# Patient Record
Sex: Female | Born: 1975
Health system: Southern US, Community
[De-identification: ages and names within clinical notes are randomized; demographics above are authoritative.]

## PROBLEM LIST (undated history)

## (undated) DIAGNOSIS — E559 Vitamin D deficiency, unspecified: Secondary | ICD-10-CM

## (undated) DIAGNOSIS — E538 Deficiency of other specified B group vitamins: Secondary | ICD-10-CM

## (undated) DIAGNOSIS — M2550 Pain in unspecified joint: Secondary | ICD-10-CM

## (undated) DIAGNOSIS — L409 Psoriasis, unspecified: Secondary | ICD-10-CM

## (undated) DIAGNOSIS — M549 Dorsalgia, unspecified: Secondary | ICD-10-CM

## (undated) DIAGNOSIS — T7840XA Allergy, unspecified, initial encounter: Secondary | ICD-10-CM

## (undated) DIAGNOSIS — M7989 Other specified soft tissue disorders: Secondary | ICD-10-CM

## (undated) DIAGNOSIS — Z973 Presence of spectacles and contact lenses: Secondary | ICD-10-CM

## (undated) DIAGNOSIS — R002 Palpitations: Secondary | ICD-10-CM

## (undated) DIAGNOSIS — F329 Major depressive disorder, single episode, unspecified: Secondary | ICD-10-CM

## (undated) DIAGNOSIS — I471 Supraventricular tachycardia, unspecified: Secondary | ICD-10-CM

## (undated) DIAGNOSIS — Z91018 Allergy to other foods: Secondary | ICD-10-CM

## (undated) DIAGNOSIS — D509 Iron deficiency anemia, unspecified: Secondary | ICD-10-CM

## (undated) DIAGNOSIS — D649 Anemia, unspecified: Secondary | ICD-10-CM

## (undated) DIAGNOSIS — K829 Disease of gallbladder, unspecified: Secondary | ICD-10-CM

## (undated) DIAGNOSIS — F32A Depression, unspecified: Secondary | ICD-10-CM

## (undated) DIAGNOSIS — R609 Edema, unspecified: Secondary | ICD-10-CM

## (undated) DIAGNOSIS — E739 Lactose intolerance, unspecified: Secondary | ICD-10-CM

## (undated) DIAGNOSIS — R011 Cardiac murmur, unspecified: Secondary | ICD-10-CM

## (undated) HISTORY — DX: Allergy, unspecified, initial encounter: T78.40XA

## (undated) HISTORY — DX: Anemia, unspecified: D64.9

## (undated) HISTORY — DX: Disease of gallbladder, unspecified: K82.9

## (undated) HISTORY — DX: Psoriasis, unspecified: L40.9

## (undated) HISTORY — DX: Vitamin D deficiency, unspecified: E55.9

## (undated) HISTORY — DX: Cardiac murmur, unspecified: R01.1

## (undated) HISTORY — DX: Allergy to other foods: Z91.018

## (undated) HISTORY — DX: Lactose intolerance, unspecified: E73.9

## (undated) HISTORY — DX: Pain in unspecified joint: M25.50

## (undated) HISTORY — DX: Palpitations: R00.2

## (undated) HISTORY — DX: Dorsalgia, unspecified: M54.9

## (undated) HISTORY — DX: Edema, unspecified: R60.9

## (undated) HISTORY — DX: Deficiency of other specified B group vitamins: E53.8

---

## 2004-10-05 HISTORY — PX: LAPAROSCOPIC CHOLECYSTECTOMY: SUR755

## 2004-10-05 HISTORY — PX: CHOLECYSTECTOMY: SHX55

## 2005-10-05 HISTORY — PX: GASTRIC BYPASS: SHX52

## 2005-10-05 HISTORY — PX: ROUX-EN-Y GASTRIC BYPASS: SHX1104

## 2006-10-05 HISTORY — PX: COLPOSCOPY: SHX161

## 2014-01-15 DIAGNOSIS — L409 Psoriasis, unspecified: Secondary | ICD-10-CM | POA: Insufficient documentation

## 2014-01-15 DIAGNOSIS — D229 Melanocytic nevi, unspecified: Secondary | ICD-10-CM

## 2014-01-15 HISTORY — DX: Melanocytic nevi, unspecified: D22.9

## 2014-06-17 ENCOUNTER — Ambulatory Visit (INDEPENDENT_AMBULATORY_CARE_PROVIDER_SITE_OTHER): Payer: PRIVATE HEALTH INSURANCE | Admitting: Family Medicine

## 2014-06-17 VITALS — BP 120/68 | HR 61 | Temp 98.2°F | Resp 16 | Ht 64.0 in | Wt 193.0 lb

## 2014-06-17 DIAGNOSIS — J3489 Other specified disorders of nose and nasal sinuses: Secondary | ICD-10-CM

## 2014-06-17 DIAGNOSIS — R0981 Nasal congestion: Secondary | ICD-10-CM

## 2014-06-17 DIAGNOSIS — J029 Acute pharyngitis, unspecified: Secondary | ICD-10-CM

## 2014-06-17 DIAGNOSIS — J039 Acute tonsillitis, unspecified: Secondary | ICD-10-CM

## 2014-06-17 LAB — POCT RAPID STREP A (OFFICE): RAPID STREP A SCREEN: NEGATIVE

## 2014-06-17 MED ORDER — AMOXICILLIN 500 MG PO CAPS: 500.0000 mg | ORAL_CAPSULE | Freq: Three times a day (TID) | ORAL | Status: DC

## 2014-06-17 NOTE — Patient Instructions (Addendum)
You should receive a call or letter about your lab results within the next week to 10 days.  Can start antibiotic as discussed, other symptomatic care below. Return to the clinic or go to the nearest emergency room if any of your symptoms worsen or new symptoms occur.  Sore Throat A sore throat is pain, burning, irritation, or scratchiness of the throat. There is often pain or tenderness when swallowing or talking. A sore throat may be accompanied by other symptoms, such as coughing, sneezing, fever, and swollen neck glands. A sore throat is often the first sign of another sickness, such as a cold, flu, strep throat, or mononucleosis (commonly known as mono). Most sore throats go away without medical treatment. CAUSES  The most common causes of a sore throat include:  A viral infection, such as a cold, flu, or mono.  A bacterial infection, such as strep throat, tonsillitis, or whooping cough.  Seasonal allergies.  Dryness in the air.  Irritants, such as smoke or pollution.  Gastroesophageal reflux disease (GERD). HOME CARE INSTRUCTIONS   Only take over-the-counter medicines as directed by your caregiver.  Drink enough fluids to keep your urine clear or pale yellow.  Rest as needed.  Try using throat sprays, lozenges, or sucking on hard candy to ease any pain (if older than 4 years or as directed).  Sip warm liquids, such as broth, herbal tea, or warm water with honey to relieve pain temporarily. You may also eat or drink cold or frozen liquids such as frozen ice pops.  Gargle with salt water (mix 1 tsp salt with 8 oz of water).  Do not smoke and avoid secondhand smoke.  Put a cool-mist humidifier in your bedroom at night to moisten the air. You can also turn on a hot shower and sit in the bathroom with the door closed for 5-10 minutes. SEEK IMMEDIATE MEDICAL CARE IF:  You have difficulty breathing.  You are unable to swallow fluids, soft foods, or your saliva.  You have  increased swelling in the throat.  Your sore throat does not get better in 7 days.  You have nausea and vomiting.  You have a fever or persistent symptoms for more than 2-3 days.  You have a fever and your symptoms suddenly get worse. MAKE SURE YOU:   Understand these instructions.  Will watch your condition.  Will get help right away if you are not doing well or get worse. Document Released: 10/29/2004 Document Revised: 09/07/2012 Document Reviewed: 05/29/2012 [facility] Patient Information 2015 [place], [place]. This information is not intended to replace advice given to you by your health care provider. Make sure you discuss any questions you have with your health care provider.

## 2014-06-17 NOTE — Progress Notes (Addendum)
Subjective:    Patient ID: Emily [name], female    DOB: [birth year], 38 y.o.   MRN: [id number] This chart was scribed for Wendie Agreste, MD by Martinique Peace, ED Scribe. The patient was seen in [place]. The patient's care was started at 11:09 AM.  Sore Throat  This is a new problem. The current episode started in the past 7 days. Associated symptoms include congestion and swollen glands. Pertinent negatives include no coughing or vomiting.   HPI Comments: Emily [name] is a 38 y.o. female who presents to the [facility] complaining of sore throat onset 4 days ago with associated nasal congestion. She notes that she did not formally take her temperature but reports feeling like she had a fever. She also adds experiencing some episodes of chills. Pt states that she tried gargling warm water and salt to address symptoms but denies experiencing much relief. She further denies cough, nausea or vomiting. She states that her husband has dealing with similar symptoms but adds that she hasn't been around him enough to have contracted something from him. Pt is non-smoker.   There are no active problems to display for this patient.  Past Medical History  Diagnosis Date  . Allergy   . Anemia    Past Surgical History  Procedure Laterality Date  . Colon surgery     No Known Allergies Prior to Admission medications   Medication Sig Start Date End Date Taking? Authorizing Provider  norgestrel-ethinyl estradiol (LO/OVRAL,CRYSELLE) 0.3-30 MG-MCG tablet Take 1 tablet by mouth daily.   Yes Historical Provider, MD   History   Social History  . Marital Status: Single    Spouse Name: N/A    Number of Children: N/A  . Years of Education: N/A   Occupational History  . Not on file.   Social History Main Topics  . Smoking status: Never Smoker   . Smokeless tobacco: Not on file  . Alcohol Use: Not on file  . Drug Use: Not on file  . Sexual Activity: Not on file   Other Topics Concern  . Not on file    Social History Narrative  . No narrative on file      Review of Systems  Constitutional: Positive for fever and chills.  HENT: Positive for congestion and sore throat.   Respiratory: Negative for cough.   Gastrointestinal: Negative for nausea and vomiting.       Objective:   Physical Exam  Vitals reviewed. Constitutional: She is oriented to person, place, and time. She appears well-developed and well-nourished. No distress.  HENT:  Head: Normocephalic and atraumatic.  Right Ear: Hearing, tympanic membrane, external ear and ear canal normal.  Left Ear: Hearing, tympanic membrane, external ear and ear canal normal.  Nose: Nose normal.  Mouth/Throat: Oropharynx is clear and moist. No oropharyngeal exudate.  White to yellow exudative patches on posterior oropharynx with tonsillar hypertrophy.    Eyes: Conjunctivae and EOM are normal. Pupils are equal, round, and reactive to light.  Neck: Neck supple.  Few scattered enlarged lymph nodes near SCM on the right.   Cardiovascular: Normal rate, regular rhythm, normal heart sounds and intact distal pulses.   No murmur heard. Pulmonary/Chest: Effort normal and breath sounds normal. No respiratory distress. She has no wheezes. She has no rhonchi.  Neurological: She is alert and oriented to person, place, and time.  Skin: Skin is warm and dry. No rash noted.  Psychiatric: She has a normal mood and affect. Her behavior is normal.  Filed Vitals:   06/17/14 1041  BP: 120/68  Pulse: 61  Temp: 98.2 F (36.8 C)  Resp: 16  Height: 5\' 4"  (1.626 m)  Weight: 193 lb (87.544 kg)  SpO2: 98%   Results for orders placed in visit on 06/17/14  POCT RAPID STREP A (OFFICE)      Result Value Ref Range   Rapid Strep A Screen Negative  Negative       Assessment & Plan:   Emily [name] is a 38 y.o. female Acute pharyngitis, unspecified pharyngitis type - Plan: POCT rapid strep A, Culture, Group A Strep, amoxicillin (AMOXIL) 500 MG  capsule  Nasal congestion - Plan: POCT rapid strep A, Culture, Group A Strep  Tonsillitis - Plan: amoxicillin (AMOXIL) 500 MG capsule Exudative tonsillitis with slight LAD, but afebrile.  DDX fasle negative strep vs viral process (CMV, EBV). tx options discussed.  Decided on amox tid for 10 days for strep coverage, warned of possible rash if mono, and contact precautions. Sx care and rtc precautions discussed. Throat cx pending.    Meds ordered this encounter           . amoxicillin (AMOXIL) 500 MG capsule    Sig: Take 1 capsule (500 mg total) by mouth 3 (three) times daily.    Dispense:  30 capsule    Refill:  0   Patient Instructions  You should receive a call or letter about your lab results within the next week to 10 days.  Can start antibiotic as discussed, other symptomatic care below. Return to the clinic or go to the nearest emergency room if any of your symptoms worsen or new symptoms occur.  Sore Throat A sore throat is pain, burning, irritation, or scratchiness of the throat. There is often pain or tenderness when swallowing or talking. A sore throat may be accompanied by other symptoms, such as coughing, sneezing, fever, and swollen neck glands. A sore throat is often the first sign of another sickness, such as a cold, flu, strep throat, or mononucleosis (commonly known as mono). Most sore throats go away without medical treatment. CAUSES  The most common causes of a sore throat include:  A viral infection, such as a cold, flu, or mono.  A bacterial infection, such as strep throat, tonsillitis, or whooping cough.  Seasonal allergies.  Dryness in the air.  Irritants, such as smoke or pollution.  Gastroesophageal reflux disease (GERD). HOME CARE INSTRUCTIONS   Only take over-the-counter medicines as directed by your caregiver.  Drink enough fluids to keep your urine clear or pale yellow.  Rest as needed.  Try using throat sprays, lozenges, or sucking on hard  candy to ease any pain (if older than 4 years or as directed).  Sip warm liquids, such as broth, herbal tea, or warm water with honey to relieve pain temporarily. You may also eat or drink cold or frozen liquids such as frozen ice pops.  Gargle with salt water (mix 1 tsp salt with 8 oz of water).  Do not smoke and avoid secondhand smoke.  Put a cool-mist humidifier in your bedroom at night to moisten the air. You can also turn on a hot shower and sit in the bathroom with the door closed for 5-10 minutes. SEEK IMMEDIATE MEDICAL CARE IF:  You have difficulty breathing.  You are unable to swallow fluids, soft foods, or your saliva.  You have increased swelling in the throat.  Your sore throat does not get better in 7 days.  You have nausea and vomiting.  You have a fever or persistent symptoms for more than 2-3 days.  You have a fever and your symptoms suddenly get worse. MAKE SURE YOU:   Understand these instructions.  Will watch your condition.  Will get help right away if you are not doing well or get worse. Document Released: 10/29/2004 Document Revised: 09/07/2012 Document Reviewed: 05/29/2012 [facility] Patient Information 2015 [place], [place]. This information is not intended to replace advice given to you by your health care provider. Make sure you discuss any questions you have with your health care provider.       I personally performed the services described in this documentation, which was scribed in my presence. The recorded information has been reviewed and considered, and addended by me as needed.

## 2014-06-20 LAB — CULTURE, GROUP A STREP: ORGANISM ID, BACTERIA: NORMAL

## 2014-09-12 DIAGNOSIS — D3132 Benign neoplasm of left choroid: Secondary | ICD-10-CM

## 2014-09-12 DIAGNOSIS — H5203 Hypermetropia, bilateral: Secondary | ICD-10-CM

## 2014-09-12 HISTORY — DX: Benign neoplasm of left choroid: D31.32

## 2014-09-12 HISTORY — DX: Hypermetropia, bilateral: H52.03

## 2015-02-08 DIAGNOSIS — L219 Seborrheic dermatitis, unspecified: Secondary | ICD-10-CM

## 2015-02-08 HISTORY — DX: Seborrheic dermatitis, unspecified: L21.9

## 2015-05-21 DIAGNOSIS — M7541 Impingement syndrome of right shoulder: Secondary | ICD-10-CM

## 2015-05-21 DIAGNOSIS — M19019 Primary osteoarthritis, unspecified shoulder: Secondary | ICD-10-CM

## 2015-05-21 DIAGNOSIS — M25511 Pain in right shoulder: Secondary | ICD-10-CM | POA: Insufficient documentation

## 2015-05-21 DIAGNOSIS — M7521 Bicipital tendinitis, right shoulder: Secondary | ICD-10-CM | POA: Insufficient documentation

## 2015-05-21 HISTORY — DX: Pain in right shoulder: M25.511

## 2015-05-21 HISTORY — DX: Bicipital tendinitis, right shoulder: M75.21

## 2015-05-21 HISTORY — DX: Primary osteoarthritis, unspecified shoulder: M19.019

## 2015-05-21 HISTORY — DX: Impingement syndrome of right shoulder: M75.41

## 2015-10-06 HISTORY — PX: ENDOMETRIAL BIOPSY: SHX622

## 2016-06-18 MED FILL — CLOBETASOL 0.05% SOLUTION: 0.05 | 30 days supply | Qty: 50 | Fill #0

## 2016-08-17 MED FILL — LARIN FE 1.5-30 TABLET: 1.5-30 | 28 days supply | Qty: 28 | Fill #0

## 2016-08-20 MED FILL — LARIN 1.5 MG-30 MCG TABLET: 1.5-30 | 28 days supply | Qty: 21 | Fill #0

## 2016-08-26 MED FILL — CLOBETASOL 0.05% OINTMENT: 0.05 | 30 days supply | Qty: 60 | Fill #0

## 2016-09-10 ENCOUNTER — Encounter (INDEPENDENT_AMBULATORY_CARE_PROVIDER_SITE_OTHER): Payer: PRIVATE HEALTH INSURANCE | Admitting: Family Medicine

## 2016-09-17 MED FILL — CLOBETASOL 0.05% SOLUTION: 0.05 | 30 days supply | Qty: 50 | Fill #1

## 2016-09-21 ENCOUNTER — Encounter (INDEPENDENT_AMBULATORY_CARE_PROVIDER_SITE_OTHER): Payer: Self-pay | Admitting: Family Medicine

## 2016-09-21 ENCOUNTER — Ambulatory Visit (INDEPENDENT_AMBULATORY_CARE_PROVIDER_SITE_OTHER): Payer: 59 | Admitting: Family Medicine

## 2016-09-21 VITALS — BP 120/66 | HR 62 | Temp 97.8°F | Resp 16 | Ht 64.0 in | Wt 205.0 lb

## 2016-09-21 DIAGNOSIS — D649 Anemia, unspecified: Secondary | ICD-10-CM | POA: Diagnosis not present

## 2016-09-21 DIAGNOSIS — Z9884 Bariatric surgery status: Secondary | ICD-10-CM | POA: Diagnosis not present

## 2016-09-21 DIAGNOSIS — R5383 Other fatigue: Secondary | ICD-10-CM | POA: Diagnosis not present

## 2016-09-21 DIAGNOSIS — R0602 Shortness of breath: Secondary | ICD-10-CM

## 2016-09-21 DIAGNOSIS — E538 Deficiency of other specified B group vitamins: Secondary | ICD-10-CM

## 2016-09-21 DIAGNOSIS — Z9189 Other specified personal risk factors, not elsewhere classified: Secondary | ICD-10-CM | POA: Diagnosis not present

## 2016-09-21 DIAGNOSIS — R011 Cardiac murmur, unspecified: Secondary | ICD-10-CM

## 2016-09-21 DIAGNOSIS — Z1389 Encounter for screening for other disorder: Secondary | ICD-10-CM

## 2016-09-21 DIAGNOSIS — E559 Vitamin D deficiency, unspecified: Secondary | ICD-10-CM | POA: Diagnosis not present

## 2016-09-21 DIAGNOSIS — R5382 Chronic fatigue, unspecified: Secondary | ICD-10-CM | POA: Diagnosis not present

## 2016-09-21 DIAGNOSIS — Z1331 Encounter for screening for depression: Secondary | ICD-10-CM

## 2016-09-21 DIAGNOSIS — Z6835 Body mass index (BMI) 35.0-35.9, adult: Secondary | ICD-10-CM

## 2016-09-21 DIAGNOSIS — E669 Obesity, unspecified: Secondary | ICD-10-CM | POA: Diagnosis not present

## 2016-09-21 DIAGNOSIS — Z0289 Encounter for other administrative examinations: Secondary | ICD-10-CM

## 2016-09-21 DIAGNOSIS — D518 Other vitamin B12 deficiency anemias: Secondary | ICD-10-CM | POA: Diagnosis not present

## 2016-09-21 NOTE — Progress Notes (Signed)
Office: [phone]  /  Fax: [phone]   HPI:   Chief Complaint: OBESITY  Emily [name] (MR# [id number]) is a 40 y.o. female who presents on 09/23/2016 for obesity evaluation and treatment. Current BMI is Body mass index is 35.19 kg/m.[name] has struggled with obesity for years and has been unsuccessful in either losing weight or maintaining long term weight loss. Ariyanna attended our information session and states she is currently in the action stage of change and ready to dedicate time achieving and maintaining a healthier weight.  Ruthine states she has significant food cravings issues  she snacks frequently in the evenings she frequently makes poor food choices. She is s/p "mini" gastric bypass in 2007, went from 336 down to 201, and maintained for a few years but her weight is starting to creep back up.   Fatigue Augustina feels her energy is lower than it should be. This has worsened with weight gain and has not worsened recently. Khilynn reports daytime somnolence and  reports waking up still tired. Patient is at risk for obstructive sleep apnea. Patent has a history of symptoms of daytime fatigue. Patient generally gets 8 hours of sleep per night, and states they generally have generally restful sleep. Snoring is not present. Apneic episodes are not present. Epworth Sleepiness Score is 17  Dyspnea on exertion [name] notes increasing shortness of breath with exercising and seems to be worsening over time with weight gain. She notes getting out of breath sooner with activity than she used to. This has not gotten worse recently. Leandra denies shortness of breath at rest or orthopnea.  Anemia Vit D deficiency B12 deficiency Pt is s/p GBP and is not taking her bariatric vitamins anymore. + fatigue  Depression Screen Halle's Food and Mood (modified PHQ-9) score was  Depression screen PHQ 2/9 09/21/2016  Decreased Interest 1  Down, Depressed, Hopeless 1  PHQ - 2  Score 2  Altered sleeping 0  Tired, decreased energy 1  Change in appetite 1  Feeling bad or failure about yourself  2  Trouble concentrating 0  Moving slowly or fidgety/restless 1  Suicidal thoughts 0  PHQ-9 Score 7      ALLERGIES: No Known Allergies  MEDICATIONS: Current Outpatient Prescriptions on File Prior to Visit  Medication Sig Dispense Refill  . amoxicillin (AMOXIL) 500 MG capsule Take 1 capsule (500 mg total) by mouth 3 (three) times daily. (Patient not taking: Reported on 09/21/2016) 30 capsule 0  . norgestrel-ethinyl estradiol (LO/OVRAL,CRYSELLE) 0.3-30 MG-MCG tablet Take 1 tablet by mouth daily.     No current facility-administered medications on file prior to visit.     PAST MEDICAL HISTORY: Past Medical History:  Diagnosis Date  . Allergy   . Anemia   . B12 deficiency   . Back pain   . Gallbladder problem   . Joint pain   . Lactose intolerance   . Multiple food allergies   . Palpitations   . Psoriasis   . Swelling   . Vitamin D deficiency     PAST SURGICAL HISTORY: Past Surgical History:  Procedure Laterality Date  . COLON SURGERY    . GASTRIC BYPASS      SOCIAL HISTORY: Social History  Substance Use Topics  . Smoking status: Never Smoker  . Smokeless tobacco: Never Used  . Alcohol use Not on file    FAMILY HISTORY: Family History  Problem Relation Age of Onset  . Hypertension Mother   . Obesity Mother   . Hypertension  Father   . Sleep apnea Father   . Obesity Father     ROS: Review of Systems  Eyes:       Vision changes  Cardiovascular:       Leg cramping   Musculoskeletal: Positive for joint pain.  Skin: Positive for itching ([place]).    PHYSICAL EXAM: Blood pressure 120/66, pulse 62, temperature 97.8 F (36.6 C), temperature source Oral, resp. rate 16, height 5\' 4"  (1.626 m), weight 205 lb (93 kg), SpO2 99 %. Body mass index is 35.19 kg/m. Physical Exam  Constitutional: She is oriented to person, place, and time.  She appears well-developed and well-nourished.  HENT:  Head: Normocephalic and atraumatic.  Nose: Nose normal.  Eyes: EOM are normal. No scleral icterus.  Neck: Normal range of motion. Neck supple. No thyromegaly present.  Cardiovascular: Normal rate and regular rhythm.   Pulmonary/Chest: Effort normal and breath sounds normal.  Abdominal: Soft. There is no tenderness.  Musculoskeletal: Normal range of motion. She exhibits edema.  Neurological: She is alert and oriented to person, place, and time. Coordination normal.  Skin: Skin is warm and dry.  Psychiatric: She has a normal mood and affect. Her behavior is normal.  Vitals reviewed.   RECENT LABS AND TESTS: BMET    Component Value Date/Time   NA 140 09/21/2016 1540   K 4.8 09/21/2016 1540   CL 100 09/21/2016 1540   CO2 25 09/21/2016 1540   GLUCOSE 85 09/21/2016 1540   BUN 14 09/21/2016 1540   CREATININE 0.68 09/21/2016 1540   CALCIUM 8.9 09/21/2016 1540   GFRNONAA 108 09/21/2016 1540   GFRAA 125 09/21/2016 1540   Lab Results  Component Value Date   HGBA1C 5.0 09/21/2016   Lab Results  Component Value Date   INSULIN 2.7 09/21/2016   CBC    Component Value Date/Time   WBC 5.9 09/21/2016 1540   RBC 4.66 09/21/2016 1540   HCT 41.1 09/21/2016 1540   MCV 88 09/21/2016 1540   MCH 29.0 09/21/2016 1540   MCHC 32.8 09/21/2016 1540   RDW 13.3 09/21/2016 1540   LYMPHSABS 2.2 09/21/2016 1540   EOSABS 0.1 09/21/2016 1540   BASOSABS 0.0 09/21/2016 1540   Iron/TIBC/Ferritin/ %Sat No results found for: IRON, TIBC, FERRITIN, IRONPCTSAT Lipid Panel     Component Value Date/Time   CHOL 192 09/21/2016 1540   TRIG 61 09/21/2016 1540   HDL 112 09/21/2016 1540   LDLCALC 68 09/21/2016 1540   Hepatic Function Panel     Component Value Date/Time   PROT 6.3 09/21/2016 1540   ALBUMIN 3.9 09/21/2016 1540   AST 31 09/21/2016 1540   ALT 30 09/21/2016 1540   ALKPHOS 77 09/21/2016 1540   BILITOT 1.6 (H) 09/21/2016 1540        Component Value Date/Time   TSH 2.210 09/21/2016 1540    ECG done today shows NSR @ 56 BPM INDIRECT CALORIMETER done today shows a REE of 2071.    ASSESSMENT AND PLAN: Other fatigue - Plan: EKG 12-Lead, Comprehensive metabolic panel, CBC With Differential, Hemoglobin A1c, Insulin, random, Lipid Panel With LDL/HDL Ratio, Vitamin B12, Folate, T3, T4, free, TSH  Shortness of breath  Vitamin D deficiency - Plan: VITAMIN D 25 Hydroxy (Vit-D Deficiency, Fractures)  Vitamin B 12 deficiency - Plan: Vitamin B12  Anemia, unspecified type - Plan: Vitamin B12  Depression screen  At risk for heart disease  Class 2 obesity with body mass index (BMI) of 35.0 to 35.9 in adult,  unspecified obesity type, unspecified whether serious comorbidity present  Status post gastric bypass for obesity  PLAN:  Fatigue Raiya was informed that her fatigue may be related to obesity, depression or many other causes. Labs will be ordered, and in the meanwhile Reecie has agreed to work on diet, exercise and weight loss to help with fatigue. Proper sleep hygiene was discussed including the need for 7-8 hours of quality sleep each night. A sleep study was not yet ordered based on symptoms and Epworth score   Exercise Induced Shortness of Breath Glori's shortness of breath appears to be obesity related and exercise induced. She has agreed to work on weight loss and gradually increase exercise to treat her exercise induced shortness of breath. If Jevaeh follows our instructions and loses weight without improvement of her shortness of breath, we will plan to refer to pulmonology. We will monitor this condition regularly. Lacricia agrees to this plan.  Multiple vitamin deficiencies s/p GBP Will check labs and start on vitamins at next visit.  Depression Screen Felisia had a positive depression screening. Depression is commonly associated with obesity and often results in emotional eating behaviors. We  will monitor this closely and work on CBT to help improve the non-hunger eating patterns. Referral to Psychology may be required if no improvement is seen as she continues in our clinic.  Obesity Marium is currently in the action stage of change and her goal is to continue with weight loss efforts She has agreed to follow the Category 3 plan Verdean has been instructed to work up to a goal of 150 minutes of combined cardio and strengthening exercise per week for weight loss and overall health benefits. We discussed the following Behavioral Modification Stratagies today: increasing lean protein intake and holiday eating strategies   Brynnlea has agreed to follow up with our clinic in 2 weeks. She was informed of the importance of frequent follow up visits to maximize her success with intensive lifestyle modifications for her multiple health conditions. She was informed we would discuss her lab results at her next visit unless there is a critical issue that needs to be addressed sooner. Levaeh agreed to keep her next visit at the agreed upon time to discuss these results.  I, Doreene Nest, am acting as scribe for Dennard Nip, MD  I have reviewed the above documentation for accuracy and completeness, and I agree with the above. -Dennard Nip, MD

## 2016-09-22 ENCOUNTER — Encounter (INDEPENDENT_AMBULATORY_CARE_PROVIDER_SITE_OTHER): Payer: Self-pay | Admitting: Family Medicine

## 2016-09-22 ENCOUNTER — Ambulatory Visit (INDEPENDENT_AMBULATORY_CARE_PROVIDER_SITE_OTHER): Payer: PRIVATE HEALTH INSURANCE | Admitting: Family Medicine

## 2016-09-22 LAB — CBC WITH DIFFERENTIAL
BASOS: 0 %
Basophils Absolute: 0 10*3/uL (ref 0.0–0.2)
EOS (ABSOLUTE): 0.1 10*3/uL (ref 0.0–0.4)
EOS: 2 %
HEMATOCRIT: 41.1 % (ref 34.0–46.6)
Hemoglobin: 13.5 g/dL (ref 11.1–15.9)
IMMATURE GRANS (ABS): 0 10*3/uL (ref 0.0–0.1)
Immature Granulocytes: 0 %
LYMPHS: 37 %
Lymphocytes Absolute: 2.2 10*3/uL (ref 0.7–3.1)
MCH: 29 pg (ref 26.6–33.0)
MCHC: 32.8 g/dL (ref 31.5–35.7)
MCV: 88 fL (ref 79–97)
MONOCYTES: 4 %
Monocytes Absolute: 0.3 10*3/uL (ref 0.1–0.9)
Neutrophils Absolute: 3.3 10*3/uL (ref 1.4–7.0)
Neutrophils: 57 %
RBC: 4.66 x10E6/uL (ref 3.77–5.28)
RDW: 13.3 % (ref 12.3–15.4)
WBC: 5.9 10*3/uL (ref 3.4–10.8)

## 2016-09-22 LAB — LIPID PANEL WITH LDL/HDL RATIO
CHOLESTEROL TOTAL: 192 mg/dL (ref 100–199)
HDL: 112 mg/dL (ref >39)
LDL Calculated: 68 mg/dL (ref 0–99)
LDl/HDL Ratio: 0.6 ratio units (ref 0.0–3.2)
Triglycerides: 61 mg/dL (ref 0–149)
VLDL Cholesterol Cal: 12 mg/dL (ref 5–40)

## 2016-09-22 LAB — T4, FREE: Free T4: 1.07 ng/dL (ref 0.82–1.77)

## 2016-09-22 LAB — COMPREHENSIVE METABOLIC PANEL
ALK PHOS: 77 IU/L (ref 39–117)
ALT: 30 IU/L (ref 0–32)
AST: 31 IU/L (ref 0–40)
Albumin/Globulin Ratio: 1.6 (ref 1.2–2.2)
Albumin: 3.9 g/dL (ref 3.5–5.5)
BILIRUBIN TOTAL: 1.6 mg/dL — AB (ref 0.0–1.2)
BUN/Creatinine Ratio: 21 (ref 9–23)
BUN: 14 mg/dL (ref 6–24)
CHLORIDE: 100 mmol/L (ref 96–106)
CO2: 25 mmol/L (ref 18–29)
CREATININE: 0.68 mg/dL (ref 0.57–1.00)
Calcium: 8.9 mg/dL (ref 8.7–10.2)
GFR calc Af Amer: 125 mL/min/{1.73_m2} (ref >59)
GFR calc non Af Amer: 108 mL/min/{1.73_m2} (ref >59)
GLUCOSE: 85 mg/dL (ref 65–99)
Globulin, Total: 2.4 g/dL (ref 1.5–4.5)
Potassium: 4.8 mmol/L (ref 3.5–5.2)
Sodium: 140 mmol/L (ref 134–144)
Total Protein: 6.3 g/dL (ref 6.0–8.5)

## 2016-09-22 LAB — HEMOGLOBIN A1C
Est. average glucose Bld gHb Est-mCnc: 97 mg/dL
Hgb A1c MFr Bld: 5 % (ref 4.8–5.6)

## 2016-09-22 LAB — VITAMIN B12: VITAMIN B 12: 181 pg/mL — AB (ref 232–1245)

## 2016-09-22 LAB — T3: T3, Total: 88 ng/dL (ref 71–180)

## 2016-09-22 LAB — TSH: TSH: 2.21 u[IU]/mL (ref 0.450–4.500)

## 2016-09-22 LAB — VITAMIN D 25 HYDROXY (VIT D DEFICIENCY, FRACTURES): Vit D, 25-Hydroxy: 27 ng/mL — ABNORMAL LOW (ref 30.0–100.0)

## 2016-09-22 LAB — INSULIN, RANDOM: INSULIN: 2.7 u[IU]/mL (ref 2.6–24.9)

## 2016-09-22 LAB — FOLATE: Folate: 3.7 ng/mL (ref >3.0)

## 2016-09-23 ENCOUNTER — Ambulatory Visit (INDEPENDENT_AMBULATORY_CARE_PROVIDER_SITE_OTHER): Payer: PRIVATE HEALTH INSURANCE | Admitting: Family Medicine

## 2016-10-07 ENCOUNTER — Ambulatory Visit (INDEPENDENT_AMBULATORY_CARE_PROVIDER_SITE_OTHER): Payer: 59 | Admitting: Family Medicine

## 2016-10-07 VITALS — BP 102/65 | Ht 64.0 in | Wt 207.0 lb

## 2016-10-07 DIAGNOSIS — E669 Obesity, unspecified: Secondary | ICD-10-CM

## 2016-10-07 DIAGNOSIS — E538 Deficiency of other specified B group vitamins: Secondary | ICD-10-CM

## 2016-10-07 DIAGNOSIS — Z9884 Bariatric surgery status: Secondary | ICD-10-CM

## 2016-10-07 DIAGNOSIS — E559 Vitamin D deficiency, unspecified: Secondary | ICD-10-CM

## 2016-10-07 DIAGNOSIS — Z6835 Body mass index (BMI) 35.0-35.9, adult: Secondary | ICD-10-CM | POA: Diagnosis not present

## 2016-10-07 MED ORDER — VITAMIN B-12 1000 MCG PO TABS: 1000.0000 ug | ORAL_TABLET | Freq: Every day | ORAL | Status: DC

## 2016-10-07 MED ORDER — VITAMIN D (ERGOCALCIFEROL) 1.25 MG (50000 UNIT) PO CAPS: 50000.0000 [IU] | ORAL_CAPSULE | ORAL | 0 refills | Status: DC

## 2016-10-07 MED FILL — VIT D2 1.25 MG (50,000 UNIT: 1.25 MG | 28 days supply | Qty: 4 | Fill #0

## 2016-10-07 NOTE — Progress Notes (Signed)
Office: [phone]  /  Fax: [phone]   HPI:   Chief Complaint: OBESITY Emily [name] is here to discuss her progress with her obesity treatment plan. She is following her eating plan approximately 40 % of the time and states she is exercising 60 minutes 6 times per week. Emily [name] is currently struggling with social eating and had less protein. States she didn't struggle with breakfast but wants more freedom for dinner. Ready to get back on track.  Her weight is 207 lb (93.9 kg) today and has had a weight loss of 2 pounds over a period of 2 weeks since her last visit. She has lost 2 lbs since starting treatment with [name].  Vitamin D deficiency Emily [name] has a diagnosis of vitamin D deficiency. She is not currently taking vit D and denies nausea, vomiting or muscle weakness. Level not at goal, positive fatigue.  Vitamin B12 deficiency Low Vitamin B12 at 181 with normal Hgb and MVC, not on bariatric vitamins, positive fatigue.  Status Post Weight Loss Surgery Patient not taking bariatric vitamins, has had multiple vitamin deficiencies in the past. Positive fatigue.   Wt Readings from Last 500 Encounters:  10/07/16 207 lb (93.9 kg)  09/21/16 205 lb (93 kg)  06/17/14 193 lb (87.5 kg)     ALLERGIES: No Known Allergies  MEDICATIONS: Current Outpatient Prescriptions on File Prior to Visit  Medication Sig Dispense Refill  . norethindrone-ethinyl estradiol-iron (MICROGESTIN FE,GILDESS FE,LOESTRIN FE) 1.5-30 MG-MCG tablet Take 1 tablet by mouth daily.     No current facility-administered medications on file prior to visit.     PAST MEDICAL HISTORY: Past Medical History:  Diagnosis Date  . Allergy   . Anemia   . B12 deficiency   . Back pain   . Gallbladder problem   . Joint pain   . Lactose intolerance   . Multiple food allergies   . Palpitations   . Psoriasis   . Swelling   . Vitamin D deficiency     PAST SURGICAL HISTORY: Past Surgical History:  Procedure Laterality  Date  . COLON SURGERY    . GASTRIC BYPASS      SOCIAL HISTORY: Social History  Substance Use Topics  . Smoking status: Never Smoker  . Smokeless tobacco: Never Used  . Alcohol use Not on file    FAMILY HISTORY: Family History  Problem Relation Age of Onset  . Hypertension Mother   . Obesity Mother   . Hypertension Father   . Sleep apnea Father   . Obesity Father     ROS: Review of Systems  Constitutional: Positive for malaise/fatigue.    PHYSICAL EXAM: Blood pressure 102/65, height 5\' 4"  (1.626 m), weight 207 lb (93.9 kg). Body mass index is 35.53 kg/m. Physical Exam  Constitutional: She is oriented to person, place, and time. She appears well-developed and well-nourished.  HENT:  Head: Normocephalic and atraumatic.  Nose: Nose normal.  Eyes: EOM are normal. No scleral icterus.  Neck: Normal range of motion. Neck supple. No thyromegaly present.  Cardiovascular: Normal rate and regular rhythm.   Pulmonary/Chest: No respiratory distress.  Abdominal: Soft. There is no tenderness.  Obesity  Musculoskeletal: Normal range of motion. She exhibits edema.  Neurological: She is alert and oriented to person, place, and time. Coordination normal.  Skin: Skin is warm and dry.  Psychiatric: She has a normal mood and affect. Her behavior is normal.    RECENT LABS AND TESTS: BMET    Component Value Date/Time   NA  140 09/21/2016 1540   K 4.8 09/21/2016 1540   CL 100 09/21/2016 1540   CO2 25 09/21/2016 1540   GLUCOSE 85 09/21/2016 1540   BUN 14 09/21/2016 1540   CREATININE 0.68 09/21/2016 1540   CALCIUM 8.9 09/21/2016 1540   GFRNONAA 108 09/21/2016 1540   GFRAA 125 09/21/2016 1540   Lab Results  Component Value Date   HGBA1C 5.0 09/21/2016   Lab Results  Component Value Date   INSULIN 2.7 09/21/2016   CBC    Component Value Date/Time   WBC 5.9 09/21/2016 1540   RBC 4.66 09/21/2016 1540   HCT 41.1 09/21/2016 1540   MCV 88 09/21/2016 1540   MCH 29.0  09/21/2016 1540   MCHC 32.8 09/21/2016 1540   RDW 13.3 09/21/2016 1540   LYMPHSABS 2.2 09/21/2016 1540   EOSABS 0.1 09/21/2016 1540   BASOSABS 0.0 09/21/2016 1540   Iron/TIBC/Ferritin/ %Sat No results found for: IRON, TIBC, FERRITIN, IRONPCTSAT Lipid Panel     Component Value Date/Time   CHOL 192 09/21/2016 1540   TRIG 61 09/21/2016 1540   HDL 112 09/21/2016 1540   LDLCALC 68 09/21/2016 1540   Hepatic Function Panel     Component Value Date/Time   PROT 6.3 09/21/2016 1540   ALBUMIN 3.9 09/21/2016 1540   AST 31 09/21/2016 1540   ALT 30 09/21/2016 1540   ALKPHOS 77 09/21/2016 1540   BILITOT 1.6 (H) 09/21/2016 1540      Component Value Date/Time   TSH 2.210 09/21/2016 1540    ASSESSMENT AND PLAN: No diagnosis found.  PLAN: Vitamin D Deficiency Emily [name] was informed that low vitamin D levels contributes to fatigue and are associated with obesity, breast, and colon cancer. She agrees to start to take prescription Vit D @50 ,000 IU every week, #4, no refills, will re-check in 3 months and will follow up for routine testing of vitamin D, at least 2-3 times per year. She was informed of the risk of over-replacement of vitamin D and agrees to not increase her dose unless he discusses this with [name] first.  B12 Deficiency Start B12 OTC 1,000 MG daily, re-check labs in 3 months.  Status Post Weight Loss Surgery Patient encouraged to re-start bariatric vitamins, will research the best options and discuss with patient at next visit in 2 weeks.  Obesity Emily [name] is currently in the action stage of change. As such, her goal is to continue with weight loss efforts She has agreed to keep a food journal with 350-500 calories and 30 grams protein daily and follow the Category 3 plan Emily [name] has been instructed to work up to a goal of 150 minutes of combined cardio and strengthening exercise per week for weight loss and overall health benefits. We discussed the following Behavioral  Modification Stratagies today: increasing lean protein intake, work on meal planning and easy cooking plans and holiday eating strategies      Emily [name] has agreed to follow up with our clinic in 2 weeks. She was informed of the importance of frequent follow up visits to maximize her success with intensive lifestyle modifications for her multiple health conditions.  I, Doreene Nest, am acting as scribe for Dennard Nip, MD  I have reviewed the above documentation for accuracy and completeness, and I agree with the above. -Dennard Nip, MD

## 2016-10-19 ENCOUNTER — Ambulatory Visit (INDEPENDENT_AMBULATORY_CARE_PROVIDER_SITE_OTHER): Payer: 59 | Admitting: Family Medicine

## 2016-10-19 VITALS — BP 107/65 | HR 52 | Temp 98.5°F | Ht 64.0 in | Wt 203.0 lb

## 2016-10-19 DIAGNOSIS — Z9889 Other specified postprocedural states: Secondary | ICD-10-CM

## 2016-10-19 DIAGNOSIS — Z6833 Body mass index (BMI) 33.0-33.9, adult: Secondary | ICD-10-CM

## 2016-10-19 DIAGNOSIS — E669 Obesity, unspecified: Secondary | ICD-10-CM

## 2016-10-19 DIAGNOSIS — E559 Vitamin D deficiency, unspecified: Secondary | ICD-10-CM | POA: Diagnosis not present

## 2016-10-19 DIAGNOSIS — E66811 Obesity, class 1: Secondary | ICD-10-CM | POA: Insufficient documentation

## 2016-10-19 DIAGNOSIS — Z9189 Other specified personal risk factors, not elsewhere classified: Secondary | ICD-10-CM | POA: Diagnosis not present

## 2016-10-20 MED ORDER — VITAMIN D (ERGOCALCIFEROL) 1.25 MG (50000 UNIT) PO CAPS: 50000.0000 [IU] | ORAL_CAPSULE | ORAL | 0 refills | Status: AC

## 2016-10-20 NOTE — Progress Notes (Signed)
Office: [phone]  /  Fax: [phone]   HPI:   Chief Complaint: OBESITY Emily [name] is here to discuss her progress with her obesity treatment plan. She is following her eating plan approximately 80 % of the time and states she is exercising/strength training 45 minutes 6 times per week. Emily [name] is currently struggling with following plan closely and feeling judged by co-workers and family who may not be judging her. She is working to get more protein, and is doing well with exercise.  Her weight is 203 lb (92.1 kg) today and has lost 4 lbs since her last visit. She has lost 2 lbs since starting treatment with [name].  Vitamin D deficiency Emily [name] has a diagnosis of vitamin D deficiency. She is currently taking vit D, not yet at goal,  Emily [name] still reports fatigue, denies nausea, vomiting or muscle weakness.  Status Post Weight Loss Surgery History of gastric bypass surgery  At risk for Depression  Wt Readings from Last 500 Encounters:  10/19/16 203 lb (92.1 kg)  10/07/16 207 lb (93.9 kg)  09/21/16 205 lb (93 kg)  06/17/14 193 lb (87.5 kg)     ALLERGIES: No Known Allergies  MEDICATIONS: Current Outpatient Prescriptions on File Prior to Visit  Medication Sig Dispense Refill  . norethindrone-ethinyl estradiol-iron (MICROGESTIN FE,GILDESS FE,LOESTRIN FE) 1.5-30 MG-MCG tablet Take 1 tablet by mouth daily.    . vitamin B-12 (CYANOCOBALAMIN) 1000 MCG tablet Take 1 tablet (1,000 mcg total) by mouth daily. (Patient taking differently: Take 2,500 mcg by mouth daily. )     No current facility-administered medications on file prior to visit.     PAST MEDICAL HISTORY: Past Medical History:  Diagnosis Date  . Allergy   . Anemia   . B12 deficiency   . Back pain   . Gallbladder problem   . Joint pain   . Lactose intolerance   . Multiple food allergies   . Palpitations   . Psoriasis   . Swelling   . Vitamin D deficiency     PAST SURGICAL HISTORY: Past Surgical History:    Procedure Laterality Date  . COLON SURGERY    . GASTRIC BYPASS      SOCIAL HISTORY: Social History  Substance Use Topics  . Smoking status: Never Smoker  . Smokeless tobacco: Never Used  . Alcohol use Not on file    FAMILY HISTORY: Family History  Problem Relation Age of Onset  . Hypertension Mother   . Obesity Mother   . Hypertension Father   . Sleep apnea Father   . Obesity Father     ROS: Review of Systems  Constitutional: Positive for malaise/fatigue and weight loss.  Gastrointestinal: Negative for nausea and vomiting.  Musculoskeletal:       Negative Muscle Weakness    PHYSICAL EXAM: Blood pressure 107/65, pulse (!) 52, temperature 98.5 F (36.9 C), temperature source Oral, height 5\' 4"  (1.626 m), weight 203 lb (92.1 kg), SpO2 100 %. Body mass index is 34.84 kg/m. Physical Exam  Constitutional: She is oriented to person, place, and time. She appears well-developed and well-nourished.  Cardiovascular: Normal rate.   Pulmonary/Chest: Effort normal.  Musculoskeletal: Normal range of motion.  Neurological: She is oriented to person, place, and time.  Skin: Skin is warm and dry.  Psychiatric: She has a normal mood and affect. Her behavior is normal.  Vitals reviewed.   RECENT LABS AND TESTS: BMET    Component Value Date/Time   NA 140 09/21/2016 1540   K  4.8 09/21/2016 1540   CL 100 09/21/2016 1540   CO2 25 09/21/2016 1540   GLUCOSE 85 09/21/2016 1540   BUN 14 09/21/2016 1540   CREATININE 0.68 09/21/2016 1540   CALCIUM 8.9 09/21/2016 1540   GFRNONAA 108 09/21/2016 1540   GFRAA 125 09/21/2016 1540   Lab Results  Component Value Date   HGBA1C 5.0 09/21/2016   Lab Results  Component Value Date   INSULIN 2.7 09/21/2016   CBC    Component Value Date/Time   WBC 5.9 09/21/2016 1540   RBC 4.66 09/21/2016 1540   HCT 41.1 09/21/2016 1540   MCV 88 09/21/2016 1540   MCH 29.0 09/21/2016 1540   MCHC 32.8 09/21/2016 1540   RDW 13.3 09/21/2016 1540    LYMPHSABS 2.2 09/21/2016 1540   EOSABS 0.1 09/21/2016 1540   BASOSABS 0.0 09/21/2016 1540   Iron/TIBC/Ferritin/ %Sat No results found for: IRON, TIBC, FERRITIN, IRONPCTSAT Lipid Panel     Component Value Date/Time   CHOL 192 09/21/2016 1540   TRIG 61 09/21/2016 1540   HDL 112 09/21/2016 1540   LDLCALC 68 09/21/2016 1540   Hepatic Function Panel     Component Value Date/Time   PROT 6.3 09/21/2016 1540   ALBUMIN 3.9 09/21/2016 1540   AST 31 09/21/2016 1540   ALT 30 09/21/2016 1540   ALKPHOS 77 09/21/2016 1540   BILITOT 1.6 (H) 09/21/2016 1540      Component Value Date/Time   TSH 2.210 09/21/2016 1540    ASSESSMENT AND PLAN: Vitamin D deficiency - Plan: Vitamin D, Ergocalciferol, (DRISDOL) 50000 units CAPS capsule  At risk for depression  Status post gastric surgery  Class 1 obesity without serious comorbidity with body mass index (BMI) of 33.0 to 33.9 in adult, unspecified obesity type  PLAN:  Vitamin D Deficiency Emily [name] was informed that low vitamin D levels contributes to fatigue and are associated with obesity, breast, and colon cancer. She agrees to continue to take prescription Vit D @50 ,000 IU every week, refill vit D 50,000 IU every week #4 with no refills and will follow up for routine testing of vitamin D, at least 2-3 times per year. She was informed of the risk of over-replacement of vitamin D and agrees to not increase her dose unless he discusses this with [name] first.  Status Post Weight Loss Surgery Emily [name] will continue on Bariatric Advantage and will follow with labs  At Risk for Depression Pt is at risk for depression due to her emotional eating and post weight loss surgery status. We spent 15 minutes discussing using food for comfort and watching for signs of depression including poor sleep, overeating, irritability, and feeling of guilt. We will continue to monitor.      Obesity Emily [name] is currently in the action stage of change. As such, her  goal is to continue with weight loss efforts She has agreed to keep a food journal with 300-450 calories and 30+ grams protein daily and follow the Category 2 plan Emily [name] has been instructed to work up to a goal of 150 minutes of combined cardio and strengthening exercise per week for weight loss and overall health benefits. We discussed the following Behavioral Modification Stratagies today: increasing lean protein intake, decreasing simple carbohydrates  and increasing lower sugar fruits  Discussed Cognitive Behavioral Therapy to help Emily [name] work on decreasing projection of her feelings onto others and to help avoid depressive symptoms.  Emily [name] has agreed to follow up with our clinic in 2 weeks. She was  informed of the importance of frequent follow up visits to maximize her success with intensive lifestyle modifications for her multiple health conditions.  I, Doreene Nest, am acting as scribe for Dennard Nip, MD  I have reviewed the above documentation for accuracy and completeness, and I agree with the above. -Dennard Nip, MD

## 2016-10-23 ENCOUNTER — Ambulatory Visit: Payer: Self-pay | Admitting: Family

## 2016-10-30 ENCOUNTER — Encounter: Payer: Self-pay | Admitting: Family

## 2016-10-30 ENCOUNTER — Ambulatory Visit (INDEPENDENT_AMBULATORY_CARE_PROVIDER_SITE_OTHER): Payer: 59 | Admitting: Family

## 2016-10-30 ENCOUNTER — Telehealth: Payer: Self-pay | Admitting: *Deleted

## 2016-10-30 DIAGNOSIS — E559 Vitamin D deficiency, unspecified: Secondary | ICD-10-CM

## 2016-10-30 DIAGNOSIS — N761 Subacute and chronic vaginitis: Secondary | ICD-10-CM | POA: Diagnosis not present

## 2016-10-30 DIAGNOSIS — E538 Deficiency of other specified B group vitamins: Secondary | ICD-10-CM | POA: Diagnosis not present

## 2016-10-30 DIAGNOSIS — Z91018 Allergy to other foods: Secondary | ICD-10-CM | POA: Insufficient documentation

## 2016-10-30 DIAGNOSIS — L409 Psoriasis, unspecified: Secondary | ICD-10-CM | POA: Diagnosis not present

## 2016-10-30 DIAGNOSIS — Z0000 Encounter for general adult medical examination without abnormal findings: Secondary | ICD-10-CM

## 2016-10-30 MED ORDER — MICROGESTIN FE 1.5/30 1.5-30 MG-MCG PO TABS: 1.0000 | ORAL_TABLET | Freq: Every day | ORAL | 1 refills | Status: DC

## 2016-10-30 MED ORDER — EPINEPHRINE 0.3 MG/0.3ML IJ SOAJ
0.3000 mg | Freq: Once | INTRAMUSCULAR | 1 refills | Status: AC
Start: 2016-10-30 — End: 2016-10-30

## 2016-10-30 MED ORDER — DESONIDE 0.05 % EX CREA: TOPICAL_CREAM | CUTANEOUS | 2 refills | Status: DC

## 2016-10-30 MED ORDER — NORETHIN ACE-ETH ESTRAD-FE 1.5-30 MG-MCG PO TABS
1.0000 | ORAL_TABLET | Freq: Every day | ORAL | 1 refills | Status: DC
Start: 2016-10-30 — End: 2017-07-22

## 2016-10-30 MED FILL — LARIN FE 1.5-30 TABLET: 1.5-30 | 84 days supply | Qty: 84 | Fill #0

## 2016-10-30 MED FILL — EPINEPHRINE 0.3 MG AUTO-INJ: 0.3 | 30 days supply | Qty: 2 | Fill #0

## 2016-10-30 MED FILL — DESONIDE 0.05% CREAM: 0.05 | 30 days supply | Qty: 30 | Fill #0

## 2016-10-30 NOTE — Assessment & Plan Note (Signed)
Would like referral to a GYN closer to home.

## 2016-10-30 NOTE — Progress Notes (Signed)
Subjective:    Patient ID: Emily [name], female    DOB: December 27, 1975, 41 y.o.   MRN: [id number]  HPI  Ms. Blamer is a 41 yr old female who presents today to establish care.   Pmhx is significant for the following:  1) Vitamin D deficiency-  Maintained on weekly vitamin D. This was restarted 3 weeks ago by Dr. Leafy Ro.    2) Vit B12 deficiency- takes a daily oral supplement.  This was recently restarted by Dr. Leafy Ro.    3) Obesity- she is following with Dr. Dennard Nip [facility]. She s/p Gastirc bypass on 2007.   Wt Readings from Last 3 Encounters:  10/30/16 205 lb 6.4 oz (93.2 kg)  10/19/16 203 lb (92.1 kg)  10/07/16 207 lb (93.9 kg)   She was following with GYN. Was on a flagyl gel   4) Psoriasis- notes that the worst area is on her scalp, also has a non-healing   5) Food allergies- needs refill on her epipen.  She reports hx of "retina problem" never followed through with referral to retina specialist.    Review of Systems  Constitutional: Negative for unexpected weight change.  HENT: Negative for hearing loss.   Eyes: Negative for visual disturbance.  Respiratory: Negative for cough.   Cardiovascular: Negative for leg swelling.  Gastrointestinal: Negative for constipation and diarrhea.  Genitourinary: Negative for dysuria and frequency.  Musculoskeletal: Negative for arthralgias and myalgias.  Neurological: Negative for headaches.  Hematological: Negative for adenopathy.  Psychiatric/Behavioral:       Denies depression/anxiety   Past Medical History:  Diagnosis Date  . Allergy   . Anemia   . B12 deficiency   . Back pain   . Gallbladder problem   . Joint pain   . Lactose intolerance   . Multiple food allergies   . Palpitations   . Psoriasis   . Swelling   . Vitamin D deficiency      Social History   Social History  . Marital status: Single    Spouse name: N/A  . Number of children: N/A  . Years of education: N/A    Occupational History  . RN [employer]   Social History Main Topics  . Smoking status: Never Smoker  . Smokeless tobacco: Never Used  . Alcohol use Not on file  . Drug use: Unknown  . Sexual activity: Not on file   Other Topics Concern  . Not on file   Social History Narrative   Works as a Engineer, maintenance at [employer]   No children   Not married   Has a dog named Arnot   From [place]- moved to [place] in 2002   Parents are both living   Has one sister back home   Enjoys going out to eat with friends   Enjoys biking and kayaking.    Goes to the GYM 6 days a week    Past Surgical History:  Procedure Laterality Date  . CHOLECYSTECTOMY  2006  . COLPOSCOPY  2008  . ENDOMETRIAL BIOPSY  2017   negative per pt  . GASTRIC BYPASS  2007    Family History  Problem Relation Age of Onset  . Hypertension Mother   . Obesity Mother   . Hypertension Father   . Sleep apnea Father   . Obesity Father   . Alzheimer's disease Maternal Grandmother   . Leukemia Maternal Grandfather   . Alzheimer's disease Paternal Grandmother   .  Cancer Paternal Grandfather   . Diabetes type II Maternal Aunt   . Stroke Maternal Aunt     Allergies  Allergen Reactions  . Almond Oil Swelling  . Apple Swelling  . Kiwi Extract Swelling  . Soy Allergy Other (See Comments)  . Strawberry Extract Other (See Comments)    Current Outpatient Prescriptions on File Prior to Visit  Medication Sig Dispense Refill  . vitamin B-12 (CYANOCOBALAMIN) 1000 MCG tablet Take 1 tablet (1,000 mcg total) by mouth daily. (Patient taking differently: Take 2,500 mcg by mouth daily. )    . Vitamin D, Ergocalciferol, (DRISDOL) 50000 units CAPS capsule Take 1 capsule (50,000 Units total) by mouth every 7 (seven) days. 4 capsule 0   No current facility-administered medications on file prior to visit.     BP (!) 120/54 (BP Location: Right Arm, Cuff Size: Large)   Pulse 62   Temp 98.2 F (36.8 C) (Oral)   Resp 18    Ht 5\' 4"  (1.626 m)   Wt 205 lb 6.4 oz (93.2 kg)   LMP 05/21/2016   SpO2 100% Comment: room air  BMI 35.26 kg/m     Objective:   Physical Exam  Constitutional: She is oriented to person, place, and time. She appears well-developed and well-nourished. No distress.  HENT:  Head: Normocephalic and atraumatic.  Right Ear: Tympanic membrane and ear canal normal.  Left Ear: Tympanic membrane and ear canal normal.  Mouth/Throat: No oropharyngeal exudate, posterior oropharyngeal edema or posterior oropharyngeal erythema.  Eyes: Conjunctivae are normal. No scleral icterus.  Neck: Neck supple.  Cardiovascular: Normal rate and regular rhythm.   No murmur heard. Pulmonary/Chest: Breath sounds normal. No respiratory distress. She has no wheezes. She has no rales. She exhibits no tenderness.  Musculoskeletal: She exhibits no edema.  Lymphadenopathy:    She has no cervical adenopathy.  Neurological: She is alert and oriented to person, place, and time.  Skin: Skin is warm and dry.  Some psoriasis noted in bilateral ear canal  Psychiatric: She has a normal mood and affect. Her behavior is normal. Judgment and thought content normal.          Assessment & Plan:

## 2016-10-30 NOTE — Telephone Encounter (Signed)
Received call from Emily [name] at [facility] stating Microgestin is no longer available in brand form and they are requesting that we send generic Rx if pt is able to take generic?  Please advise?

## 2016-10-30 NOTE — Assessment & Plan Note (Signed)
Stable with prn use of steroid cream.

## 2016-10-30 NOTE — Telephone Encounter (Signed)
OK, please notify patient .

## 2016-10-30 NOTE — Assessment & Plan Note (Signed)
Continue vit D supplement

## 2016-10-30 NOTE — Assessment & Plan Note (Signed)
Refill provided for epipen.  Patient is advised to also keep liquid benadryl with her.

## 2016-10-30 NOTE — Progress Notes (Signed)
Pre visit review using our clinic review tool, if applicable. No additional management support is needed unless otherwise documented below in the visit note. 

## 2016-10-30 NOTE — Assessment & Plan Note (Signed)
Continue oral b12 supplemtn.

## 2016-10-30 NOTE — Telephone Encounter (Signed)
Notified pt and she is ok to try generic. Rx re-sent.

## 2016-10-30 NOTE — Patient Instructions (Addendum)
Please schedule a complete physical at the front desk. Continue healthy diet and regular exercise.  You will be contacted about your referral to GYN. Please send me the information about your retinal diagnosis via mychart and I will arrange your referral to the [facility] specialist.

## 2016-11-04 ENCOUNTER — Ambulatory Visit (INDEPENDENT_AMBULATORY_CARE_PROVIDER_SITE_OTHER): Payer: 59 | Admitting: Family Medicine

## 2016-11-04 VITALS — BP 107/66 | HR 56 | Temp 98.6°F | Ht 64.0 in | Wt 204.0 lb

## 2016-11-04 DIAGNOSIS — F3289 Other specified depressive episodes: Secondary | ICD-10-CM | POA: Diagnosis not present

## 2016-11-04 DIAGNOSIS — E669 Obesity, unspecified: Secondary | ICD-10-CM

## 2016-11-04 DIAGNOSIS — Z9884 Bariatric surgery status: Secondary | ICD-10-CM | POA: Diagnosis not present

## 2016-11-04 DIAGNOSIS — Z6835 Body mass index (BMI) 35.0-35.9, adult: Secondary | ICD-10-CM | POA: Diagnosis not present

## 2016-11-04 DIAGNOSIS — E559 Vitamin D deficiency, unspecified: Secondary | ICD-10-CM

## 2016-11-04 MED ORDER — BUPROPION HCL ER (SR) 150 MG PO TB12: 150.0000 mg | ORAL_TABLET | Freq: Every day | ORAL | 0 refills | Status: DC

## 2016-11-04 MED FILL — VIT D2 1.25 MG (50,000 UNIT: 1.25 MG | 28 days supply | Qty: 4 | Fill #0

## 2016-11-04 MED FILL — BUPROPION SR 150 MG TABLET: 150 | 30 days supply | Qty: 30 | Fill #0

## 2016-11-04 NOTE — Progress Notes (Signed)
Office: [phone]  /  Fax: [phone]   HPI:   Chief Complaint: OBESITY Emily [name] is here to discuss her progress with her obesity treatment plan. She is following her eating plan approximately 80 % of the time and states she is exercising 60 minutes 6 times per week. Emily [name] is currently struggling with more cravings in the past 2 weeks. She likes the structure of the plan but she is having more snacks at night. Her weight is 204 lb (92.5 kg) today and she has had a weight gain of 1 lb over a period of 2 weeks since her last visit. She has lost 1 lbs since starting treatment with [name].  Depression Emily [name] has increased emotional eating and increased stress, she is feeling more out of control. She denies suicidal or homicidal ideas.  Vitamin D deficiency Emily [name] has a diagnosis of vitamin D deficiency. She is currently taking vit D and denies nausea, vomiting or muscle weakness.  Wt Readings from Last 500 Encounters:  11/04/16 204 lb (92.5 kg)  10/30/16 205 lb 6.4 oz (93.2 kg)  10/19/16 203 lb (92.1 kg)  10/07/16 207 lb (93.9 kg)  09/21/16 205 lb (93 kg)  06/17/14 193 lb (87.5 kg)     ALLERGIES: Allergies  Allergen Reactions  . Almond Oil Swelling  . Apple Swelling  . Kiwi Extract Swelling  . Soy Allergy Other (See Comments)  . Strawberry Extract Other (See Comments)    MEDICATIONS: Current Outpatient Prescriptions on File Prior to Visit  Medication Sig Dispense Refill  . clobetasol (TEMOVATE) 0.05 % external solution APPLY TO SCALP 3-4 TIMES PER WEEK AS NEEDED FOR ITCHING AND SCALING. NOT TO FACE.    . clobetasol ointment (TEMOVATE) 0.05 % Apply twice a day to thickest, most resistant areas, never on the face or in the skin folds.    Marland Kitchen desonide (DESOWEN) 0.05 % cream Apply to affected areas on the face and ears twice daily for 2 weeks, then daily for 2 wks, then taper off. Repeat as needed. 30 g 2  . norethindrone-ethinyl estradiol-iron (MICROGESTIN FE 1.5/30)  1.5-30 MG-MCG tablet Take 1 tablet by mouth daily. 3 Package 1  . vitamin B-12 (CYANOCOBALAMIN) 1000 MCG tablet Take 1 tablet (1,000 mcg total) by mouth daily. (Patient taking differently: Take 2,500 mcg by mouth daily. )    . Vitamin D, Ergocalciferol, (DRISDOL) 50000 units CAPS capsule Take 1 capsule (50,000 Units total) by mouth every 7 (seven) days. 4 capsule 0   No current facility-administered medications on file prior to visit.     PAST MEDICAL HISTORY: Past Medical History:  Diagnosis Date  . Allergy   . Anemia   . B12 deficiency   . Back pain   . Gallbladder problem   . Joint pain   . Lactose intolerance   . Multiple food allergies   . Palpitations   . Psoriasis   . Swelling   . Vitamin D deficiency     PAST SURGICAL HISTORY: Past Surgical History:  Procedure Laterality Date  . CHOLECYSTECTOMY  2006  . COLPOSCOPY  2008  . ENDOMETRIAL BIOPSY  2017   negative per pt  . GASTRIC BYPASS  2007    SOCIAL HISTORY: Social History  Substance Use Topics  . Smoking status: Never Smoker  . Smokeless tobacco: Never Used  . Alcohol use Yes     Comment: 2-3 glasses a week    FAMILY HISTORY: Family History  Problem Relation Age of Onset  . Hypertension Mother   .  Obesity Mother   . Hypertension Father   . Sleep apnea Father   . Obesity Father   . Alzheimer's disease Maternal Grandmother   . Leukemia Maternal Grandfather   . Alzheimer's disease Paternal Grandmother   . Cancer Paternal Grandfather   . Diabetes type II Maternal Aunt   . Stroke Maternal Aunt     ROS: Review of Systems  Constitutional: Negative for weight loss.  Gastrointestinal: Negative for nausea and vomiting.  Musculoskeletal:       Negative Muscle Weakness  Psychiatric/Behavioral: Negative for suicidal ideas.       Negative for homicidal ideas Positive Stress    PHYSICAL EXAM: Blood pressure 107/66, pulse (!) 56, temperature 98.6 F (37 C), temperature source Oral, height 5\' 4"  (1.626  m), weight 204 lb (92.5 kg), last menstrual period 05/15/2016, SpO2 98 %. Body mass index is 35.02 kg/m. Physical Exam  Constitutional: She is oriented to person, place, and time. She appears well-developed and well-nourished.  Cardiovascular: Normal rate.   Pulmonary/Chest: Effort normal.  Musculoskeletal: Normal range of motion. She exhibits edema (trace edema bilateral lower extremeties).  Neurological: She is oriented to person, place, and time.  Skin: Skin is warm and dry.  Vitals reviewed.   RECENT LABS AND TESTS: BMET    Component Value Date/Time   NA 140 09/21/2016 1540   K 4.8 09/21/2016 1540   CL 100 09/21/2016 1540   CO2 25 09/21/2016 1540   GLUCOSE 85 09/21/2016 1540   BUN 14 09/21/2016 1540   CREATININE 0.68 09/21/2016 1540   CALCIUM 8.9 09/21/2016 1540   GFRNONAA 108 09/21/2016 1540   GFRAA 125 09/21/2016 1540   Lab Results  Component Value Date   HGBA1C 5.0 09/21/2016   Lab Results  Component Value Date   INSULIN 2.7 09/21/2016   CBC    Component Value Date/Time   WBC 5.9 09/21/2016 1540   RBC 4.66 09/21/2016 1540   HCT 41.1 09/21/2016 1540   MCV 88 09/21/2016 1540   MCH 29.0 09/21/2016 1540   MCHC 32.8 09/21/2016 1540   RDW 13.3 09/21/2016 1540   LYMPHSABS 2.2 09/21/2016 1540   EOSABS 0.1 09/21/2016 1540   BASOSABS 0.0 09/21/2016 1540   Iron/TIBC/Ferritin/ %Sat No results found for: IRON, TIBC, FERRITIN, IRONPCTSAT Lipid Panel     Component Value Date/Time   CHOL 192 09/21/2016 1540   TRIG 61 09/21/2016 1540   HDL 112 09/21/2016 1540   LDLCALC 68 09/21/2016 1540   Hepatic Function Panel     Component Value Date/Time   PROT 6.3 09/21/2016 1540   ALBUMIN 3.9 09/21/2016 1540   AST 31 09/21/2016 1540   ALT 30 09/21/2016 1540   ALKPHOS 77 09/21/2016 1540   BILITOT 1.6 (H) 09/21/2016 1540      Component Value Date/Time   TSH 2.210 09/21/2016 1540    ASSESSMENT AND PLAN: Other depression - Plan: buPROPion (WELLBUTRIN SR) 150 MG 12  hr tablet  Vitamin D deficiency  Class 2 obesity without serious comorbidity with body mass index (BMI) of 35.0 to 35.9 in adult, unspecified obesity type  PLAN:  Depression with Emotional Eating Behaviors We discussed behavior modification techniques today to help [name] deal with her emotional eating and depression. She has agreed to start to take Wellbutrin SR 150 mg every morning #30 with no refills and agreed to follow up as directed.  Vitamin D Deficiency Zyanya was informed that low vitamin D levels contributes to fatigue and are associated with obesity, breast,  and colon cancer. She agrees to continue to take prescription Vit D @50 ,000 IU every week and will re-check labs in 1 month and will follow up for routine testing of vitamin D, at least 2-3 times per year. She was informed of the risk of over-replacement of vitamin D and agrees to not increase her dose unless he discusses this with [name] first.  Obesity Adamina is currently in the action stage of change. As such, her goal is to continue with weight loss efforts She has agreed to follow the Category 3 plan Shiana has been instructed to work up to a goal of 150 minutes of combined cardio and strengthening exercise per week for weight loss and overall health benefits. We discussed the following Behavioral Modification Strategies today: increasing lean protein intake, decreasing simple carbohydrates , increasing lower sugar fruits, increasing fiber rich foods, work on meal planning and easy cooking plans, ways to avoid boredom eating, ways to avoid night time snacking and avoiding temptations   Mackynzie has agreed to follow up with our clinic in 2 weeks. She was informed of the importance of frequent follow up visits to maximize her success with intensive lifestyle modifications for her multiple health conditions.  I, Doreene Nest, am acting as scribe for Dennard Nip, MD  I have reviewed the above documentation for  accuracy and completeness, and I agree with the above. -Dennard Nip, MD

## 2016-11-17 ENCOUNTER — Encounter: Payer: Self-pay | Admitting: Family

## 2016-11-17 MED ORDER — OSELTAMIVIR PHOSPHATE 75 MG PO CAPS: 75.0000 mg | ORAL_CAPSULE | Freq: Every day | ORAL | 0 refills | Status: DC

## 2016-11-17 MED FILL — OSELTAMIVIR PHOSPHATE 75 MG: 75 | 10 days supply | Qty: 10 | Fill #0

## 2016-11-17 NOTE — Telephone Encounter (Signed)
Rx sent to [facility].

## 2016-11-17 NOTE — Telephone Encounter (Signed)
See message, send Tamiflu 75 mg 1 by mouth daily #10 no refills  Received: Today  Iota, MD  Damita Dunnings, CMA

## 2016-11-17 NOTE — Telephone Encounter (Signed)
Please advise 

## 2016-11-19 ENCOUNTER — Ambulatory Visit (HOSPITAL_BASED_OUTPATIENT_CLINIC_OR_DEPARTMENT_OTHER)
Admission: RE | Admit: 2016-11-19 | Discharge: 2016-11-19 | Disposition: A | Discharge status: Home / Self Care | Payer: 59 | Source: Ambulatory Visit | Attending: Family | Admitting: Family

## 2016-11-19 ENCOUNTER — Encounter (INDEPENDENT_AMBULATORY_CARE_PROVIDER_SITE_OTHER): Payer: Self-pay | Admitting: Family Medicine

## 2016-11-19 ENCOUNTER — Ambulatory Visit (INDEPENDENT_AMBULATORY_CARE_PROVIDER_SITE_OTHER): Payer: 59 | Admitting: Family Medicine

## 2016-11-19 VITALS — BP 115/69 | HR 63 | Temp 97.8°F | Ht 64.0 in | Wt 202.0 lb

## 2016-11-19 DIAGNOSIS — F3289 Other specified depressive episodes: Secondary | ICD-10-CM | POA: Diagnosis not present

## 2016-11-19 DIAGNOSIS — N6489 Other specified disorders of breast: Secondary | ICD-10-CM | POA: Insufficient documentation

## 2016-11-19 DIAGNOSIS — Z6834 Body mass index (BMI) 34.0-34.9, adult: Secondary | ICD-10-CM | POA: Diagnosis not present

## 2016-11-19 DIAGNOSIS — E669 Obesity, unspecified: Secondary | ICD-10-CM

## 2016-11-19 DIAGNOSIS — E559 Vitamin D deficiency, unspecified: Secondary | ICD-10-CM

## 2016-11-19 DIAGNOSIS — R928 Other abnormal and inconclusive findings on diagnostic imaging of breast: Secondary | ICD-10-CM | POA: Insufficient documentation

## 2016-11-19 DIAGNOSIS — E66811 Obesity, class 1: Secondary | ICD-10-CM | POA: Insufficient documentation

## 2016-11-19 DIAGNOSIS — Z1231 Encounter for screening mammogram for malignant neoplasm of breast: Secondary | ICD-10-CM | POA: Diagnosis not present

## 2016-11-19 DIAGNOSIS — Z0000 Encounter for general adult medical examination without abnormal findings: Secondary | ICD-10-CM

## 2016-11-19 MED ORDER — BUPROPION HCL ER (SR) 200 MG PO TB12: 200.0000 mg | ORAL_TABLET | Freq: Every day | ORAL | 0 refills | Status: DC

## 2016-11-19 MED FILL — BUPROPION HCL SR 200 MG TAB: 200 | 30 days supply | Qty: 30 | Fill #0

## 2016-11-19 NOTE — Progress Notes (Signed)
Office: [phone]  /  Fax: [phone]   HPI:   Chief Complaint: OBESITY Emily [name] is here to discuss her progress with her obesity treatment plan. She is following her eating plan approximately 75 % of the time and states she is exercising 45 minutes 6 times per week. Patient continues to well with weight loss over the last two years.  Emily [name] is still currently struggling with snacking especially at night but doing well with decreased temptations at work.  Her weight is 202 lb (91.6 kg) today and has had a weight loss of 2 pounds over a period of 2 weeks since her last visit. She has lost 3 lbs since starting treatment with [name].  Depression with emotional eating behaviors Emily [name] is struggling with emotional eating and using food for comfort to the extent that it is negatively impacting her health. She often snacks when she is not hungry. Emily [name] sometimes feels she is out of control and then feels guilty that she made poor food choices. She has been working on behavior modification techniques to help reduce her emotional eating and has been somewhat successful. She shows no sign of suicidal or homicidal ideations.  Vitamin D deficiency Emily [name] has a diagnosis of vitamin D deficiency. She is not currently taking vit D and denies nausea, vomiting or muscle weakness.    Depression screen PHQ 2/9 09/21/2016  Decreased Interest 1  Down, Depressed, Hopeless 1  PHQ - 2 Score 2  Altered sleeping 0  Tired, decreased energy 1  Change in appetite 1  Feeling bad or failure about yourself  2  Trouble concentrating 0  Moving slowly or fidgety/restless 1  Suicidal thoughts 0  PHQ-9 Score 7     Wt Readings from Last 500 Encounters:  11/19/16 202 lb (91.6 kg)  11/04/16 204 lb (92.5 kg)  10/30/16 205 lb 6.4 oz (93.2 kg)  10/19/16 203 lb (92.1 kg)  10/07/16 207 lb (93.9 kg)  09/21/16 205 lb (93 kg)  06/17/14 193 lb (87.5 kg)     ALLERGIES: Allergies  Allergen Reactions  .  Almond Oil Swelling  . Apple Swelling  . Kiwi Extract Swelling  . Soy Allergy Other (See Comments)  . Strawberry Extract Other (See Comments)    MEDICATIONS: Current Outpatient Prescriptions on File Prior to Visit  Medication Sig Dispense Refill  . clobetasol (TEMOVATE) 0.05 % external solution APPLY TO SCALP 3-4 TIMES PER WEEK AS NEEDED FOR ITCHING AND SCALING. NOT TO FACE.    . clobetasol ointment (TEMOVATE) 0.05 % Apply twice a day to thickest, most resistant areas, never on the face or in the skin folds.    Marland Kitchen desonide (DESOWEN) 0.05 % cream Apply to affected areas on the face and ears twice daily for 2 weeks, then daily for 2 wks, then taper off. Repeat as needed. 30 g 2  . norethindrone-ethinyl estradiol-iron (MICROGESTIN FE 1.5/30) 1.5-30 MG-MCG tablet Take 1 tablet by mouth daily. 3 Package 1  . oseltamivir (TAMIFLU) 75 MG capsule Take 1 capsule (75 mg total) by mouth daily. 10 capsule 0  . vitamin B-12 (CYANOCOBALAMIN) 1000 MCG tablet Take 1 tablet (1,000 mcg total) by mouth daily. (Patient taking differently: Take 2,500 mcg by mouth daily. )     No current facility-administered medications on file prior to visit.     PAST MEDICAL HISTORY: Past Medical History:  Diagnosis Date  . Allergy   . Anemia   . B12 deficiency   . Back pain   . Gallbladder  problem   . Joint pain   . Lactose intolerance   . Multiple food allergies   . Palpitations   . Psoriasis   . Swelling   . Vitamin D deficiency     PAST SURGICAL HISTORY: Past Surgical History:  Procedure Laterality Date  . CHOLECYSTECTOMY  2006  . COLPOSCOPY  2008  . ENDOMETRIAL BIOPSY  2017   negative per pt  . GASTRIC BYPASS  2007    SOCIAL HISTORY: Social History  Substance Use Topics  . Smoking status: Never Smoker  . Smokeless tobacco: Never Used  . Alcohol use Yes     Comment: 2-3 glasses a week    FAMILY HISTORY: Family History  Problem Relation Age of Onset  . Hypertension Mother   . Obesity Mother    . Hypertension Father   . Sleep apnea Father   . Obesity Father   . Alzheimer's disease Maternal Grandmother   . Leukemia Maternal Grandfather   . Alzheimer's disease Paternal Grandmother   . Cancer Paternal Grandfather   . Diabetes type II Maternal Aunt   . Stroke Maternal Aunt     ROS: Review of Systems  Constitutional: Positive for weight loss.  All other systems reviewed and are negative.   PHYSICAL EXAM: Blood pressure 115/69, pulse 63, temperature 97.8 F (36.6 C), temperature source Oral, height 5\' 4"  (1.626 m), weight 202 lb (91.6 kg), last menstrual period 05/19/2016, SpO2 97 %. Body mass index is 34.67 kg/m. Physical Exam  Constitutional: She is oriented to person, place, and time. She appears well-developed and well-nourished.  Cardiovascular: Normal rate.   Musculoskeletal: Normal range of motion.  Neurological: She is oriented to person, place, and time.  Skin: Skin is warm and dry.  Psychiatric: She has a normal mood and affect. Her behavior is normal.  Vitals reviewed.   RECENT LABS AND TESTS: BMET    Component Value Date/Time   NA 140 09/21/2016 1540   K 4.8 09/21/2016 1540   CL 100 09/21/2016 1540   CO2 25 09/21/2016 1540   GLUCOSE 85 09/21/2016 1540   BUN 14 09/21/2016 1540   CREATININE 0.68 09/21/2016 1540   CALCIUM 8.9 09/21/2016 1540   GFRNONAA 108 09/21/2016 1540   GFRAA 125 09/21/2016 1540   Lab Results  Component Value Date   HGBA1C 5.0 09/21/2016   Lab Results  Component Value Date   INSULIN 2.7 09/21/2016   CBC    Component Value Date/Time   WBC 5.9 09/21/2016 1540   RBC 4.66 09/21/2016 1540   HCT 41.1 09/21/2016 1540   MCV 88 09/21/2016 1540   MCH 29.0 09/21/2016 1540   MCHC 32.8 09/21/2016 1540   RDW 13.3 09/21/2016 1540   LYMPHSABS 2.2 09/21/2016 1540   EOSABS 0.1 09/21/2016 1540   BASOSABS 0.0 09/21/2016 1540   Iron/TIBC/Ferritin/ %Sat No results found for: IRON, TIBC, FERRITIN, IRONPCTSAT Lipid Panel       Component Value Date/Time   CHOL 192 09/21/2016 1540   TRIG 61 09/21/2016 1540   HDL 112 09/21/2016 1540   LDLCALC 68 09/21/2016 1540   Hepatic Function Panel     Component Value Date/Time   PROT 6.3 09/21/2016 1540   ALBUMIN 3.9 09/21/2016 1540   AST 31 09/21/2016 1540   ALT 30 09/21/2016 1540   ALKPHOS 77 09/21/2016 1540   BILITOT 1.6 (H) 09/21/2016 1540      Component Value Date/Time   TSH 2.210 09/21/2016 1540    ASSESSMENT AND PLAN: Other  depression - Plan: buPROPion (WELLBUTRIN SR) 200 MG 12 hr tablet  Vitamin D deficiency  Class 1 obesity without serious comorbidity with body mass index (BMI) of 34.0 to 34.9 in adult, unspecified obesity type  PLAN: Depression with Emotional Eating Behaviors We discussed behavior modification techniques today to help Emily [name] deal with her emotional eating and depression. She has agreed to an increase in her Wellbutrin SR to 200 mg qd and and new prescription was ordered for her. We will follow up in two weeks as directed.  Vitamin D Deficiency Emily [name] was informed that low vitamin D levels contributes to fatigue and are associated with obesity, breast, and colon cancer. She reported no nausea or vomiting and She agrees to continue to take prescription Vit D @50 ,000 IU every week and will follow up for routine testing of vitamin D in six weeks. She was informed of the risk of over-replacement of vitamin D and agrees to not increase her dose unless he discusses this with [name] first.  Obesity Emily [name] is currently in the action stage of change. As such, her goal is to continue with weight loss efforts She has agreed to keep a food journal with 300 to 450 calories and 30 protein for breakfast and follow the Category 3 plan Emily [name] has been instructed to work up to a goal of 150 minutes of combined cardio and strengthening exercise per week for weight loss and overall health benefits. We discussed the following Behavioral Modification  Stratagies today: increasing lean protein intake, decreasing simple carbohydrates , increasing vegetables, work on meal planning and easy cooking plans and dealing with family or coworker sabotage, learning how to journal.  Emily [name] has agreed to follow up with our clinic in 2 weeks. She was informed of the importance of frequent follow up visits to maximize her success with intensive lifestyle modifications for her multiple health conditions.  I, Mandie Ward, am acting as scribe for Dennard Nip, MD  I have reviewed the above documentation for accuracy and completeness, and I agree with the above. -Dennard Nip, MD

## 2016-11-23 ENCOUNTER — Ambulatory Visit (HOSPITAL_BASED_OUTPATIENT_CLINIC_OR_DEPARTMENT_OTHER)
Admission: RE | Admit: 2016-11-23 | Discharge: 2016-11-23 | Disposition: A | Discharge status: Home / Self Care | Payer: 59 | Source: Ambulatory Visit | Attending: Family | Admitting: Family

## 2016-11-23 ENCOUNTER — Encounter: Payer: Self-pay | Admitting: Family

## 2016-11-23 ENCOUNTER — Ambulatory Visit (INDEPENDENT_AMBULATORY_CARE_PROVIDER_SITE_OTHER): Payer: 59 | Admitting: Family

## 2016-11-23 VITALS — BP 109/51 | HR 76 | Temp 98.6°F | Ht 64.0 in | Wt 203.6 lb

## 2016-11-23 DIAGNOSIS — J111 Influenza due to unidentified influenza virus with other respiratory manifestations: Secondary | ICD-10-CM | POA: Diagnosis not present

## 2016-11-23 DIAGNOSIS — R05 Cough: Secondary | ICD-10-CM | POA: Diagnosis not present

## 2016-11-23 DIAGNOSIS — R062 Wheezing: Secondary | ICD-10-CM | POA: Diagnosis not present

## 2016-11-23 MED ORDER — ALBUTEROL SULFATE HFA 108 (90 BASE) MCG/ACT IN AERS: 2.0000 | INHALATION_SPRAY | Freq: Four times a day (QID) | RESPIRATORY_TRACT | 0 refills | Status: DC | PRN

## 2016-11-23 MED FILL — VENTOLIN HFA 90 MCG INHALER: 108 (90 BAS | 25 days supply | Qty: 18 | Fill #0

## 2016-11-23 NOTE — Progress Notes (Signed)
Pre visit review using our clinic review tool, if applicable. No additional management support is needed unless otherwise documented below in the visit note. 

## 2016-11-23 NOTE — Patient Instructions (Signed)
Begin albuterol. Complete x ray on the first floor. Call if new/worsening symptoms or if symptoms are not improved in 3-4 days.

## 2016-11-23 NOTE — Progress Notes (Signed)
Subjective:    Patient ID: Emily [name], female    DOB: 1975-10-20, 41 y.o.   MRN: [id number]  HPI  Emily [name] is  41 yr old female who presents today with chief complaint of cough. She reports tht she is on day 6 of tamiflu prophylaxis.  She was exposed to someone with the flu on 2/9 and 2/11.  She reports that she began coughing on 2/13.  Developed fever/body aches on 2/16 and   Had fever Friday/Saturday and Sunday (tmax 101). She reports + sob with walking, and + wheezing.    Review of Systems See HPI  Past Medical History:  Diagnosis Date  . Allergy   . Anemia   . B12 deficiency   . Back pain   . Gallbladder problem   . Joint pain   . Lactose intolerance   . Multiple food allergies   . Palpitations   . Psoriasis   . Swelling   . Vitamin D deficiency      Social History   Social History  . Marital status: Single    Spouse name: N/A  . Number of children: N/A  . Years of education: N/A   Occupational History  . RN [employer]   Social History Main Topics  . Smoking status: Never Smoker  . Smokeless tobacco: Never Used  . Alcohol use Yes     Comment: 2-3 glasses a week  . Drug use: No  . Sexual activity: Yes    Birth control/ protection: Pill   Other Topics Concern  . Not on file   Social History Narrative   Works as a Engineer, maintenance at [employer]   No children   Not married   Has a dog named Milford   From [place]- moved to [place] in 2002   Parents are both living   Has one sister back home   Enjoys going out to eat with friends   Enjoys biking and kayaking.    Goes to the [employer] 6 days a week    Past Surgical History:  Procedure Laterality Date  . CHOLECYSTECTOMY  2006  . COLPOSCOPY  2008  . ENDOMETRIAL BIOPSY  2017   negative per pt  . GASTRIC BYPASS  2007    Family History  Problem Relation Age of Onset  . Hypertension Mother   . Obesity Mother   . Hypertension Father   . Sleep apnea Father   . Obesity Father   . Alzheimer's  disease Maternal Grandmother   . Leukemia Maternal Grandfather   . Alzheimer's disease Paternal Grandmother   . Cancer Paternal Grandfather   . Diabetes type II Maternal Aunt   . Stroke Maternal Aunt     Allergies  Allergen Reactions  . Almond Oil Swelling  . Apple Swelling  . Kiwi Extract Swelling  . Soy Allergy Other (See Comments)  . Strawberry Extract Other (See Comments)    Current Outpatient Prescriptions on File Prior to Visit  Medication Sig Dispense Refill  . buPROPion (WELLBUTRIN SR) 200 MG 12 hr tablet Take 1 tablet (200 mg total) by mouth daily. 30 tablet 0  . clobetasol (TEMOVATE) 0.05 % external solution APPLY TO SCALP 3-4 TIMES PER WEEK AS NEEDED FOR ITCHING AND SCALING. NOT TO FACE.    . clobetasol ointment (TEMOVATE) 0.05 % Apply twice a day to thickest, most resistant areas, never on the face or in the skin folds.    Marland Kitchen desonide (DESOWEN) 0.05 % cream  Apply to affected areas on the face and ears twice daily for 2 weeks, then daily for 2 wks, then taper off. Repeat as needed. 30 g 2  . Multiple Vitamin (MULTIVITAMIN) capsule Take 1 capsule by mouth daily.     . norethindrone-ethinyl estradiol-iron (MICROGESTIN FE 1.5/30) 1.5-30 MG-MCG tablet Take 1 tablet by mouth daily. 3 Package 1  . oseltamivir (TAMIFLU) 75 MG capsule Take 1 capsule (75 mg total) by mouth daily. 10 capsule 0  . vitamin B-12 (CYANOCOBALAMIN) 1000 MCG tablet Take 1 tablet (1,000 mcg total) by mouth daily. (Patient taking differently: Take 2,500 mcg by mouth daily. )    . Vitamin D, Ergocalciferol, (DRISDOL) 50000 units CAPS capsule Take 50,000 Units by mouth every 7 (seven) days.     No current facility-administered medications on file prior to visit.     BP (!) 109/51 (BP Location: Right Arm, Patient Position: Sitting, Cuff Size: Large)   Pulse 76   Temp 98.6 F (37 C) (Oral)   Ht 5\' 4"  (1.626 m)   Wt 203 lb 9.6 oz (92.4 kg)   LMP 05/19/2016   SpO2 99%   BMI 34.95 kg/m       Objective:     Physical Exam  Constitutional: She is oriented to person, place, and time. She appears well-developed and well-nourished.  HENT:  Head: Normocephalic and atraumatic.  Right Ear: Tympanic membrane and ear canal normal.  Left Ear: Tympanic membrane normal.  Canal appears mildly irritated.   Cardiovascular: Normal rate, regular rhythm and normal heart sounds.   No murmur heard. Pulmonary/Chest: Effort normal. No respiratory distress. She has wheezes.  Musculoskeletal: She exhibits no edema.  Neurological: She is alert and oriented to person, place, and time.  Psychiatric: She has a normal mood and affect. Her behavior is normal. Judgment and thought content normal.          Assessment & Plan:  Influenza- symptoms most consistent with flu.  She has completed 6 days of prophylactic tamiflu. Complete tamiflu. Will obtain CXR to rule out PNA. Rx with albuterol prn. If wheezing does not improve considerably with albuterol, could consider prednisone taper.

## 2016-11-26 ENCOUNTER — Other Ambulatory Visit: Payer: Self-pay | Admitting: Family

## 2016-11-26 DIAGNOSIS — R928 Other abnormal and inconclusive findings on diagnostic imaging of breast: Secondary | ICD-10-CM

## 2016-12-01 ENCOUNTER — Ambulatory Visit
Admission: RE | Admit: 2016-12-01 | Discharge: 2016-12-01 | Disposition: A | Discharge status: Home / Self Care | Payer: 59 | Source: Ambulatory Visit | Attending: Family | Admitting: Family

## 2016-12-01 ENCOUNTER — Other Ambulatory Visit (INDEPENDENT_AMBULATORY_CARE_PROVIDER_SITE_OTHER): Payer: Self-pay

## 2016-12-01 ENCOUNTER — Encounter (INDEPENDENT_AMBULATORY_CARE_PROVIDER_SITE_OTHER): Payer: Self-pay | Admitting: Family Medicine

## 2016-12-01 DIAGNOSIS — R928 Other abnormal and inconclusive findings on diagnostic imaging of breast: Secondary | ICD-10-CM

## 2016-12-01 DIAGNOSIS — N6489 Other specified disorders of breast: Secondary | ICD-10-CM | POA: Diagnosis not present

## 2016-12-01 DIAGNOSIS — E559 Vitamin D deficiency, unspecified: Secondary | ICD-10-CM

## 2016-12-01 MED ORDER — VITAMIN D (ERGOCALCIFEROL) 1.25 MG (50000 UNIT) PO CAPS: 50000.0000 [IU] | ORAL_CAPSULE | ORAL | 0 refills | Status: DC

## 2016-12-01 MED FILL — VIT D2 1.25 MG (50,000 UNIT: 1.25 MG | 28 days supply | Qty: 4 | Fill #0

## 2016-12-01 NOTE — Telephone Encounter (Signed)
Please refill x 1 mo

## 2016-12-08 ENCOUNTER — Ambulatory Visit (INDEPENDENT_AMBULATORY_CARE_PROVIDER_SITE_OTHER): Payer: 59 | Admitting: Family Medicine

## 2016-12-08 VITALS — BP 109/68 | HR 53 | Temp 98.3°F | Ht 64.0 in | Wt 201.0 lb

## 2016-12-08 DIAGNOSIS — F3289 Other specified depressive episodes: Secondary | ICD-10-CM | POA: Diagnosis not present

## 2016-12-08 DIAGNOSIS — E669 Obesity, unspecified: Secondary | ICD-10-CM | POA: Diagnosis not present

## 2016-12-08 DIAGNOSIS — Z6834 Body mass index (BMI) 34.0-34.9, adult: Secondary | ICD-10-CM

## 2016-12-08 DIAGNOSIS — Z9884 Bariatric surgery status: Secondary | ICD-10-CM | POA: Diagnosis not present

## 2016-12-08 DIAGNOSIS — F32A Depression, unspecified: Secondary | ICD-10-CM | POA: Insufficient documentation

## 2016-12-08 DIAGNOSIS — F329 Major depressive disorder, single episode, unspecified: Secondary | ICD-10-CM | POA: Insufficient documentation

## 2016-12-08 DIAGNOSIS — E538 Deficiency of other specified B group vitamins: Secondary | ICD-10-CM | POA: Insufficient documentation

## 2016-12-08 HISTORY — DX: Deficiency of other specified B group vitamins: E53.8

## 2016-12-08 HISTORY — DX: Bariatric surgery status: Z98.84

## 2016-12-08 MED ORDER — B-12 2500 MCG SL SUBL: 2500.0000 ug | SUBLINGUAL_TABLET | Freq: Every day | SUBLINGUAL | 0 refills | Status: DC

## 2016-12-09 MED FILL — B-12 2500 MCG SUBL: 2500 | 75 days supply | Qty: 75 | Fill #0

## 2016-12-09 NOTE — Progress Notes (Signed)
Office: [phone]  /  Fax: [phone]   HPI:   Chief Complaint: OBESITY Emily [name] is here to discuss her progress with her obesity treatment plan. She is following her eating plan approximately 80 % of the time and states she is exercising 60 minutes 5 times per week. Emily [name] has done well with journaling breakfast and is doing well meeting her calorie and protein goal. She would like to start journaling for the entire day. Her weight is 201 lb (91.2 kg) today and has had a weight loss of 1 pound over a period of 2 to 3 weeks since her last visit. She has lost 4 lbs since starting treatment with [name].  Depression with emotional eating behaviors Ferrin increased Wellbutrin SR to 200 mg and noted intense dreams and some increased anxiety which has now resolved. Her blood pressure is stable. She is struggling with emotional eating and using food for comfort to the extent that it is negatively impacting her health. She often snacks when she is not hungry. Emily [name] sometimes feels she is out of control and then feels guilty that she made poor food choices. She has been working on behavior modification techniques to help reduce her emotional eating and has been somewhat successful. She shows no sign of suicidal or homicidal ideations.  B12 Deficiency Emily [name] is status post gastric bypass and is on B12 supplement, last level not at goal.  Depression screen [facility] 2/9 09/21/2016  Decreased Interest 1  Down, Depressed, Hopeless 1  PHQ - 2 Score 2  Altered sleeping 0  Tired, decreased energy 1  Change in appetite 1  Feeling bad or failure about yourself  2  Trouble concentrating 0  Moving slowly or fidgety/restless 1  Suicidal thoughts 0  PHQ-9 Score 7     Wt Readings from Last 500 Encounters:  12/08/16 201 lb (91.2 kg)  11/23/16 203 lb 9.6 oz (92.4 kg)  11/19/16 202 lb (91.6 kg)  11/04/16 204 lb (92.5 kg)  10/30/16 205 lb 6.4 oz (93.2 kg)  10/19/16 203 lb (92.1 kg)  10/07/16 207  lb (93.9 kg)  09/21/16 205 lb (93 kg)  06/17/14 193 lb (87.5 kg)     ALLERGIES: Allergies  Allergen Reactions  . Almond Oil Swelling  . Apple Swelling  . Kiwi Extract Swelling  . Soy Allergy Other (See Comments)  . Strawberry Extract Other (See Comments)    MEDICATIONS: Current Outpatient Prescriptions on File Prior to Visit  Medication Sig Dispense Refill  . albuterol (PROVENTIL HFA;VENTOLIN HFA) 108 (90 Base) MCG/ACT inhaler Inhale 2 puffs into the lungs every 6 (six) hours as needed for wheezing or shortness of breath. 1 Inhaler 0  . buPROPion (WELLBUTRIN SR) 200 MG 12 hr tablet Take 1 tablet (200 mg total) by mouth daily. 30 tablet 0  . clobetasol (TEMOVATE) 0.05 % external solution APPLY TO SCALP 3-4 TIMES PER WEEK AS NEEDED FOR ITCHING AND SCALING. NOT TO FACE.    . clobetasol ointment (TEMOVATE) 0.05 % Apply twice a day to thickest, most resistant areas, never on the face or in the skin folds.    Marland Kitchen desonide (DESOWEN) 0.05 % cream Apply to affected areas on the face and ears twice daily for 2 weeks, then daily for 2 wks, then taper off. Repeat as needed. 30 g 2  . Multiple Vitamin (MULTIVITAMIN) capsule Take 1 capsule by mouth daily.     . norethindrone-ethinyl estradiol-iron (MICROGESTIN FE 1.5/30) 1.5-30 MG-MCG tablet Take 1 tablet by mouth daily. 3  Package 1  . Vitamin D, Ergocalciferol, (DRISDOL) 50000 units CAPS capsule Take 1 capsule (50,000 Units total) by mouth every 7 (seven) days. 4 capsule 0   No current facility-administered medications on file prior to visit.     PAST MEDICAL HISTORY: Past Medical History:  Diagnosis Date  . Allergy   . Anemia   . B12 deficiency   . Back pain   . Gallbladder problem   . Joint pain   . Lactose intolerance   . Multiple food allergies   . Palpitations   . Psoriasis   . Swelling   . Vitamin D deficiency     PAST SURGICAL HISTORY: Past Surgical History:  Procedure Laterality Date  . CHOLECYSTECTOMY  2006  . COLPOSCOPY   2008  . ENDOMETRIAL BIOPSY  2017   negative per pt  . GASTRIC BYPASS  2007    SOCIAL HISTORY: Social History  Substance Use Topics  . Smoking status: Never Smoker  . Smokeless tobacco: Never Used  . Alcohol use Yes     Comment: 2-3 glasses a week    FAMILY HISTORY: Family History  Problem Relation Age of Onset  . Hypertension Mother   . Obesity Mother   . Hypertension Father   . Sleep apnea Father   . Obesity Father   . Alzheimer's disease Maternal Grandmother   . Leukemia Maternal Grandfather   . Alzheimer's disease Paternal Grandmother   . Cancer Paternal Grandfather   . Diabetes type II Maternal Aunt   . Stroke Maternal Aunt     ROS: Review of Systems  Constitutional: Positive for weight loss.  Psychiatric/Behavioral: Positive for depression. Negative for suicidal ideas.       Anxiety    PHYSICAL EXAM: Blood pressure 109/68, pulse (!) 53, temperature 98.3 F (36.8 C), temperature source Oral, height 5\' 4"  (1.626 m), weight 201 lb (91.2 kg), last menstrual period 11/28/2016, SpO2 99 %. Body mass index is 34.5 kg/m. Physical Exam  Constitutional: She is oriented to person, place, and time. She appears well-developed and well-nourished.  Cardiovascular: Normal rate.   Pulmonary/Chest: Effort normal.  Musculoskeletal: Normal range of motion.  Neurological: She is oriented to person, place, and time.  Skin: Skin is warm and dry.  Vitals reviewed.   RECENT LABS AND TESTS: BMET    Component Value Date/Time   NA 140 09/21/2016 1540   K 4.8 09/21/2016 1540   CL 100 09/21/2016 1540   CO2 25 09/21/2016 1540   GLUCOSE 85 09/21/2016 1540   BUN 14 09/21/2016 1540   CREATININE 0.68 09/21/2016 1540   CALCIUM 8.9 09/21/2016 1540   GFRNONAA 108 09/21/2016 1540   GFRAA 125 09/21/2016 1540   Lab Results  Component Value Date   HGBA1C 5.0 09/21/2016   Lab Results  Component Value Date   INSULIN 2.7 09/21/2016   CBC    Component Value Date/Time   WBC 5.9  09/21/2016 1540   RBC 4.66 09/21/2016 1540   HCT 41.1 09/21/2016 1540   MCV 88 09/21/2016 1540   MCH 29.0 09/21/2016 1540   MCHC 32.8 09/21/2016 1540   RDW 13.3 09/21/2016 1540   LYMPHSABS 2.2 09/21/2016 1540   EOSABS 0.1 09/21/2016 1540   BASOSABS 0.0 09/21/2016 1540   Iron/TIBC/Ferritin/ %Sat No results found for: IRON, TIBC, FERRITIN, IRONPCTSAT Lipid Panel     Component Value Date/Time   CHOL 192 09/21/2016 1540   TRIG 61 09/21/2016 1540   HDL 112 09/21/2016 1540   LDLCALC 68  09/21/2016 1540   Hepatic Function Panel     Component Value Date/Time   PROT 6.3 09/21/2016 1540   ALBUMIN 3.9 09/21/2016 1540   AST 31 09/21/2016 1540   ALT 30 09/21/2016 1540   ALKPHOS 77 09/21/2016 1540   BILITOT 1.6 (H) 09/21/2016 1540      Component Value Date/Time   TSH 2.210 09/21/2016 1540    ASSESSMENT AND PLAN: Other depression  Vitamin B 12 deficiency - Plan: Cyanocobalamin (B-12) 2500 MCG SUBL  Status post gastric bypass for obesity - Plan: Cyanocobalamin (B-12) 2500 MCG SUBL  Class 1 obesity without serious comorbidity with body mass index (BMI) of 34.0 to 34.9 in adult, unspecified obesity type  PLAN:  Depression with Emotional Eating Behaviors We discussed behavior modification techniques today to help Alizee deal with her emotional eating and depression. She has agreed to continue to take Wellbutrin SR to 200 mg qd #30 refill for 1 month and monitor for increased anxiety and will follow up as directed.  B12 Deficiency Kayia agrees to start vitamin B12 2,500 sublingual daily #30 with no refills and we will re-check labs in 1 month.  Obesity Medha is currently in the action stage of change. As such, her goal is to continue with weight loss efforts She has agreed to keep a food journal with 1300 to 1600 calories and 80+ grams of protein daily Nalaysia has been instructed to work up to a goal of 150 minutes of combined cardio and strengthening exercise per week  for weight loss and overall health benefits. We discussed the following Behavioral Modification Stratagies today: increasing lean protein intake, decreasing simple carbohydrates , work on meal planning and easy cooking plans and decrease liquid calories  Camiya has agreed to follow up with our clinic in 2 weeks. She was informed of the importance of frequent follow up visits to maximize her success with intensive lifestyle modifications for her multiple health conditions.  I, Doreene Nest, am acting as scribe for Dennard Nip, MD  I have reviewed the above documentation for accuracy and completeness, and I agree with the above. -Dennard Nip, MD

## 2016-12-11 ENCOUNTER — Encounter: Payer: 59 | Admitting: Obstetrics & Gynecology

## 2016-12-18 ENCOUNTER — Other Ambulatory Visit (INDEPENDENT_AMBULATORY_CARE_PROVIDER_SITE_OTHER): Payer: Self-pay | Admitting: Family Medicine

## 2016-12-18 DIAGNOSIS — F3289 Other specified depressive episodes: Secondary | ICD-10-CM

## 2016-12-21 ENCOUNTER — Ambulatory Visit (INDEPENDENT_AMBULATORY_CARE_PROVIDER_SITE_OTHER): Payer: 59 | Admitting: Obstetrics & Gynecology

## 2016-12-21 ENCOUNTER — Other Ambulatory Visit (INDEPENDENT_AMBULATORY_CARE_PROVIDER_SITE_OTHER): Payer: Self-pay | Admitting: Family Medicine

## 2016-12-21 ENCOUNTER — Encounter (INDEPENDENT_AMBULATORY_CARE_PROVIDER_SITE_OTHER): Payer: Self-pay | Admitting: Family Medicine

## 2016-12-21 ENCOUNTER — Encounter: Payer: Self-pay | Admitting: Obstetrics & Gynecology

## 2016-12-21 VITALS — BP 130/57 | HR 58 | Ht 64.0 in | Wt 210.0 lb

## 2016-12-21 DIAGNOSIS — N898 Other specified noninflammatory disorders of vagina: Secondary | ICD-10-CM

## 2016-12-21 DIAGNOSIS — F3289 Other specified depressive episodes: Secondary | ICD-10-CM

## 2016-12-21 DIAGNOSIS — N761 Subacute and chronic vaginitis: Secondary | ICD-10-CM

## 2016-12-21 MED ORDER — BUPROPION HCL ER (SR) 200 MG PO TB12: 200.0000 mg | ORAL_TABLET | Freq: Every day | ORAL | 0 refills | Status: DC

## 2016-12-21 MED ORDER — METRONIDAZOLE 500 MG PO TABS: 500.0000 mg | ORAL_TABLET | Freq: Two times a day (BID) | ORAL | 0 refills | Status: DC

## 2016-12-21 MED FILL — metroNIDAZOLE 500 MG TABS: 500 | 7 days supply | Qty: 14 | Fill #0

## 2016-12-21 MED FILL — BUPROPION HCL SR 200 MG TAB: 200 | 30 days supply | Qty: 30 | Fill #0

## 2016-12-21 NOTE — Progress Notes (Signed)
Patient states states that she deals with the BV about 15 days out of 30 in an average month.  Symptoms - white creamy discharge with odor and occasionally notices a light green color. Kathrene Alu RN BSN

## 2016-12-21 NOTE — Progress Notes (Signed)
History:  41 y.o. G0P0000 here today for recurrent BV. Pt reports sx for 1 year.  She reports a white creamy discharge this is daily.  She says that I is excessive.  He had been treated with a probiotic and a Metrogel combo.  Pt was seen by [place].  Pt had cx done.  Only once was it called BV.  Pt reports an odor with the discharge- not fishy but, smelled like 'feces'  The smell has changed. Smells 'sour'.  Pt denies STI. Pt single and sexually active; monogamous x3 years.    Pt OCPs x 20 years.  Pt changed to higher dose of EES from [place] to 1.5/30 mcg. Sx occurred before change.  Pt has had labial sores that she thought was related to ingrown hairs. These are sometimes painful.    Underwear cotton- 93% 7%; Panty liners- [employer] brand; soap: Dove- white liquid; detergent: [place] brand packs. Partner soap not completely sure.   Pt is not taking probiotics. Eats yogurt daily: Lite and fit [place].  The following portions of the patient's history were reviewed and updated as appropriate: allergies, current medications, past family history, past medical history, past social history, past surgical history and problem list.  Review of Systems:  Pertinent items are noted in HPI.   Objective:  Physical Exam Blood pressure (!) 130/57, pulse (!) 58, height 5\' 4"  (1.626 m), weight 210 lb (95.3 kg), last menstrual period 11/28/2016. BP (!) 130/57 (BP Location: Left Arm)   Pulse (!) 58   Ht 5\' 4"  (1.626 m)   Wt 210 lb (95.3 kg)   LMP 11/28/2016 (Exact Date)   BMI 36.05 kg/m  CONSTITUTIONAL: Well-developed, well-nourished female in no acute distress.  HENT:  Normocephalic, atraumatic EYES: Conjunctivae and EOM are normal. No scleral icterus.  NECK: Normal range of motion SKIN: Skin is warm and dry. No rash noted. Not diaphoretic.No pallor. [place]: Alert and oriented to person, place, and time. Normal coordination.  Abd: Soft, nontender and nondistended Pelvic: external genitalia- mod  erythema and edema; normal appearing vaginal mucosa and cervix. No CMT White creamy discharge.     Assessment & Plan:  Vaginitis- possibly recurrent BV vs vaginitis due to allergic reaction  Flagyl 500mg  bid x 7 days f/u Affirm GO WHITE reviewed in detail f/u in 6 weeks or sooner prn If swab + for BV rec adding Boric acid to vagina 600mg  qHS for 21 days in ADDITION to the above regimen.   Total face-to-face time with patient was 25 min.  Greater than 50% was spent in counseling and coordination of care with the patient.   Nicholad Kautzman L. Harraway-Smith, M.D., Cherlynn June

## 2016-12-21 NOTE — Patient Instructions (Signed)
GO WHITE: Soap: UNSCENTED Dove (white box light green writing) NOT liquid soap Laundry detergent (underwear; wah clothss)- Dreft or [employer] unscented WHITE 100% cotton panties (NOT just cotton crouch) Sanitary napkin/panty liners: UNSCENTED.  If it doesn't SAY unscented it can have a scent/perfume    NO PERFUMES OR LOTIONS OR POTIONS in the vulvar area (may use regular [place]) Condoms: hypoallergenic only. Non dyed (no color) Toilet papers: white only Wash clothes: use a separate wash cloth. WHITE.  Washed in [place].

## 2016-12-22 LAB — CERVICOVAGINAL ANCILLARY ONLY
Bacterial vaginitis: POSITIVE — AB
CANDIDA VAGINITIS: NEGATIVE

## 2016-12-23 ENCOUNTER — Other Ambulatory Visit: Payer: Self-pay | Admitting: Obstetrics & Gynecology

## 2016-12-23 ENCOUNTER — Ambulatory Visit (INDEPENDENT_AMBULATORY_CARE_PROVIDER_SITE_OTHER): Payer: 59 | Admitting: Family Medicine

## 2016-12-23 VITALS — BP 101/64 | HR 54 | Temp 97.8°F | Resp 16 | Ht 64.0 in | Wt 207.0 lb

## 2016-12-23 DIAGNOSIS — F329 Major depressive disorder, single episode, unspecified: Secondary | ICD-10-CM

## 2016-12-23 DIAGNOSIS — E669 Obesity, unspecified: Secondary | ICD-10-CM | POA: Diagnosis not present

## 2016-12-23 DIAGNOSIS — B9689 Other specified bacterial agents as the cause of diseases classified elsewhere: Secondary | ICD-10-CM

## 2016-12-23 DIAGNOSIS — Z6835 Body mass index (BMI) 35.0-35.9, adult: Secondary | ICD-10-CM | POA: Diagnosis not present

## 2016-12-23 DIAGNOSIS — R350 Frequency of micturition: Secondary | ICD-10-CM | POA: Diagnosis not present

## 2016-12-23 DIAGNOSIS — F32A Depression, unspecified: Secondary | ICD-10-CM

## 2016-12-23 DIAGNOSIS — N760 Acute vaginitis: Principal | ICD-10-CM

## 2016-12-23 MED ORDER — BORIC ACID CRYS: 600.0000 mg | CRYSTALS | Freq: Every day | 2 refills | Status: AC

## 2016-12-24 ENCOUNTER — Encounter: Payer: Self-pay | Admitting: Obstetrics & Gynecology

## 2016-12-24 ENCOUNTER — Telehealth: Payer: Self-pay

## 2016-12-24 MED ORDER — NALTREXONE-BUPROPION HCL ER 8-90 MG PO TB12: 2.0000 | ORAL_TABLET | Freq: Two times a day (BID) | ORAL | 0 refills | Status: DC

## 2016-12-24 NOTE — Telephone Encounter (Signed)
Patient called and made aware that she does have BV. Patient made aware that Dr. Ihor Dow has prescribed boric acid vaginal suppositories that she should used night for one monthly. Then she is to do them twice a week. Made sure patient knew not to ingest this medication. Patient states understanding. Patient also made aware to continue the regimen they had talked about during the office visit.patient states understanding and agrees. Kathrene Alu RN BSN

## 2016-12-24 NOTE — Progress Notes (Signed)
Office: [phone]  /  Fax: [phone]   HPI:   Chief Complaint: OBESITY Emily [name] is here to discuss her progress with her obesity treatment plan. She is following her eating plan approximately 95 % of the time and states she is exercising 60 minutes 6 times per week. Emily [name] is status post gastric bypass. She has had increased celebration eating for 4 days for her birthday. She feels her eating out is out of control and can't seem to resist temptation. Her weight is 207 lb (93.9 kg) today and has had a weight gain of 6 lbs over a period of 2 weeks since her last visit. She has gained 2 lbs since starting treatment with [name].  Depression with emotional eating behaviors Emily [name] is struggling with emotional eating and using food for comfort to the extent that it is negatively impacting her health. She often snacks when she is not hungry. Emily [name] sometimes feels she is out of control and then feels guilty that she made poor food choices. She was put on Wellbutrin but is still struggling with emotional eating and is not sure if it is helping. She has been working on behavior modification techniques to help reduce her emotional eating and has been somewhat successful. She shows no sign of suicidal or homicidal ideations.  Depression screen [facility] 2/9 09/21/2016  Decreased Interest 1  Down, Depressed, Hopeless 1  PHQ - 2 Score 2  Altered sleeping 0  Tired, decreased energy 1  Change in appetite 1  Feeling bad or failure about yourself  2  Trouble concentrating 0  Moving slowly or fidgety/restless 1  Suicidal thoughts 0  PHQ-9 Score 7     Wt Readings from Last 500 Encounters:  12/23/16 207 lb (93.9 kg)  12/21/16 210 lb (95.3 kg)  12/08/16 201 lb (91.2 kg)  11/23/16 203 lb 9.6 oz (92.4 kg)  11/19/16 202 lb (91.6 kg)  11/04/16 204 lb (92.5 kg)  10/30/16 205 lb 6.4 oz (93.2 kg)  10/19/16 203 lb (92.1 kg)  10/07/16 207 lb (93.9 kg)  09/21/16 205 lb (93 kg)  06/17/14 193 lb (87.5 kg)      ALLERGIES: Allergies  Allergen Reactions  . Almond Oil Swelling  . Apple Swelling  . Kiwi Extract Swelling  . Soy Allergy Other (See Comments)  . Strawberry Extract Other (See Comments)    MEDICATIONS: Current Outpatient Prescriptions on File Prior to Visit  Medication Sig Dispense Refill  . albuterol (PROVENTIL HFA;VENTOLIN HFA) 108 (90 Base) MCG/ACT inhaler Inhale 2 puffs into the lungs every 6 (six) hours as needed for wheezing or shortness of breath. 1 Inhaler 0  . clobetasol (TEMOVATE) 0.05 % external solution APPLY TO SCALP 3-4 TIMES PER WEEK AS NEEDED FOR ITCHING AND SCALING. NOT TO FACE.    . clobetasol ointment (TEMOVATE) 0.05 % Apply twice a day to thickest, most resistant areas, never on the face or in the skin folds.    . Cyanocobalamin (B-12) 2500 MCG SUBL Place 2,500 mcg under the tongue daily. 30 tablet 0  . desonide (DESOWEN) 0.05 % cream Apply to affected areas on the face and ears twice daily for 2 weeks, then daily for 2 wks, then taper off. Repeat as needed. 30 g 2  . metroNIDAZOLE (FLAGYL) 500 MG tablet Take 1 tablet (500 mg total) by mouth 2 (two) times daily. 14 tablet 0  . Multiple Vitamin (MULTIVITAMIN) capsule Take 1 capsule by mouth daily.     . norethindrone-ethinyl estradiol-iron (MICROGESTIN FE 1.5/30)  1.5-30 MG-MCG tablet Take 1 tablet by mouth daily. 3 Package 1  . Vitamin D, Ergocalciferol, (DRISDOL) 50000 units CAPS capsule Take 1 capsule (50,000 Units total) by mouth every 7 (seven) days. 4 capsule 0   No current facility-administered medications on file prior to visit.     PAST MEDICAL HISTORY: Past Medical History:  Diagnosis Date  . Allergy   . Anemia   . B12 deficiency   . Back pain   . Gallbladder problem   . Joint pain   . Lactose intolerance   . Multiple food allergies   . Palpitations   . Psoriasis   . Swelling   . Vitamin D deficiency     PAST SURGICAL HISTORY: Past Surgical History:  Procedure Laterality Date  .  CHOLECYSTECTOMY  2006  . COLPOSCOPY  2008  . ENDOMETRIAL BIOPSY  2017   negative per pt  . GASTRIC BYPASS  2007    SOCIAL HISTORY: Social History  Substance Use Topics  . Smoking status: Never Smoker  . Smokeless tobacco: Never Used  . Alcohol use Yes     Comment: 2-3 glasses a week    FAMILY HISTORY: Family History  Problem Relation Age of Onset  . Hypertension Mother   . Obesity Mother   . Hypertension Father   . Sleep apnea Father   . Obesity Father   . Alzheimer's disease Maternal Grandmother   . Leukemia Maternal Grandfather   . Alzheimer's disease Paternal Grandmother   . Cancer Paternal Grandfather   . Diabetes type II Maternal Aunt   . Stroke Maternal Aunt     ROS: Review of Systems  Constitutional: Negative for weight loss.  Psychiatric/Behavioral: Positive for depression. Negative for suicidal ideas.    PHYSICAL EXAM: Blood pressure 101/64, pulse (!) 54, temperature 97.8 F (36.6 C), temperature source Oral, resp. rate 16, height 5\' 4"  (1.626 m), weight 207 lb (93.9 kg), last menstrual period 11/28/2016, SpO2 100 %. Body mass index is 35.53 kg/m. Physical Exam  Constitutional: She is oriented to person, place, and time. She appears well-developed and well-nourished.  Cardiovascular: Normal rate.   Pulmonary/Chest: Effort normal.  Musculoskeletal: Normal range of motion.  Neurological: She is oriented to person, place, and time.  Skin: Skin is warm and dry.  Psychiatric: She has a normal mood and affect. Her behavior is normal.  Vitals reviewed.   RECENT LABS AND TESTS: BMET    Component Value Date/Time   NA 140 09/21/2016 1540   K 4.8 09/21/2016 1540   CL 100 09/21/2016 1540   CO2 25 09/21/2016 1540   GLUCOSE 85 09/21/2016 1540   BUN 14 09/21/2016 1540   CREATININE 0.68 09/21/2016 1540   CALCIUM 8.9 09/21/2016 1540   GFRNONAA 108 09/21/2016 1540   GFRAA 125 09/21/2016 1540   Lab Results  Component Value Date   HGBA1C 5.0 09/21/2016    Lab Results  Component Value Date   INSULIN 2.7 09/21/2016   CBC    Component Value Date/Time   WBC 5.9 09/21/2016 1540   RBC 4.66 09/21/2016 1540   HCT 41.1 09/21/2016 1540   MCV 88 09/21/2016 1540   MCH 29.0 09/21/2016 1540   MCHC 32.8 09/21/2016 1540   RDW 13.3 09/21/2016 1540   LYMPHSABS 2.2 09/21/2016 1540   EOSABS 0.1 09/21/2016 1540   BASOSABS 0.0 09/21/2016 1540   Iron/TIBC/Ferritin/ %Sat No results found for: IRON, TIBC, FERRITIN, IRONPCTSAT Lipid Panel     Component Value Date/Time   CHOL  192 09/21/2016 1540   TRIG 61 09/21/2016 1540   HDL 112 09/21/2016 1540   LDLCALC 68 09/21/2016 1540   Hepatic Function Panel     Component Value Date/Time   PROT 6.3 09/21/2016 1540   ALBUMIN 3.9 09/21/2016 1540   AST 31 09/21/2016 1540   ALT 30 09/21/2016 1540   ALKPHOS 77 09/21/2016 1540   BILITOT 1.6 (H) 09/21/2016 1540      Component Value Date/Time   TSH 2.210 09/21/2016 1540    ASSESSMENT AND PLAN: Depression, unspecified depression type - Plan: Naltrexone-Bupropion HCl ER (CONTRAVE) 8-90 MG TB12  Class 2 obesity without serious comorbidity with body mass index (BMI) of 35.0 to 35.9 in adult, unspecified obesity type - Plan: Naltrexone-Bupropion HCl ER (CONTRAVE) 8-90 MG TB12  PLAN:  Depression with Emotional Eating Behaviors We discussed behavior modification techniques today to help Azarya deal with her emotional eating and depression. She has agreed to discontinue  Wellbutrin and start to take Contrave 8-90 mg  2 tablets po bid #120 with no refills and agreed to follow up as directed.  We spent > than 50% of the 30 minute visit on the counseling as documented in the note.  Obesity Marylon is currently in the action stage of change. As such, her goal is to continue with weight loss efforts She has agreed to keep a food journal with 1300 to 1600 calories and 80+ grams of protein daily Arrietty has been instructed to work up to a goal of 150 minutes  of combined cardio and strengthening exercise per week for weight loss and overall health benefits. We discussed the following Behavioral Modification Stratagies today: increasing lean protein intake, increasing lower sugar fruits, decrease eating out and dealing with family or coworker sabotage, shopping strategies to avoid temptations. We discussed various medication options to help Nikeria with her weight loss efforts and we both agreed to start Sophonie Goforth has agreed to follow up with our clinic in 2 weeks. She was informed of the importance of frequent follow up visits to maximize her success with intensive lifestyle modifications for her multiple health conditions.  I, Doreene Nest, am acting as scribe for Dennard Nip, MD  I have reviewed the above documentation for accuracy and completeness, and I agree with the above. -Dennard Nip, MD

## 2016-12-28 ENCOUNTER — Encounter (INDEPENDENT_AMBULATORY_CARE_PROVIDER_SITE_OTHER): Payer: Self-pay | Admitting: Family Medicine

## 2016-12-28 ENCOUNTER — Telehealth: Payer: Self-pay

## 2016-12-28 MED FILL — CONTRAVE ER 8-90 MG TABLET: 8-90 | 30 days supply | Qty: 120 | Fill #0

## 2016-12-28 NOTE — Telephone Encounter (Signed)
Patient called and states she got the boric acid actually off of [place]. Per Dr. Ihor Dow this is fine. Kathrene Alu RNBSN

## 2017-01-07 ENCOUNTER — Ambulatory Visit (INDEPENDENT_AMBULATORY_CARE_PROVIDER_SITE_OTHER): Payer: 59 | Admitting: Family Medicine

## 2017-01-07 VITALS — BP 113/69 | HR 53 | Temp 98.4°F | Ht 64.0 in | Wt 205.0 lb

## 2017-01-07 DIAGNOSIS — E559 Vitamin D deficiency, unspecified: Secondary | ICD-10-CM | POA: Diagnosis not present

## 2017-01-07 DIAGNOSIS — F3289 Other specified depressive episodes: Secondary | ICD-10-CM

## 2017-01-07 DIAGNOSIS — Z6835 Body mass index (BMI) 35.0-35.9, adult: Secondary | ICD-10-CM

## 2017-01-07 DIAGNOSIS — E669 Obesity, unspecified: Secondary | ICD-10-CM | POA: Diagnosis not present

## 2017-01-07 MED ORDER — VITAMIN D (ERGOCALCIFEROL) 1.25 MG (50000 UNIT) PO CAPS: 50000.0000 [IU] | ORAL_CAPSULE | ORAL | 0 refills | Status: DC

## 2017-01-08 MED FILL — VIT D2 1.25 MG (50,000 UNIT: 1.25 MG | 28 days supply | Qty: 4 | Fill #0

## 2017-01-12 NOTE — Progress Notes (Signed)
Office: [phone]  /  Fax: [phone]   HPI:   Chief Complaint: OBESITY Emily [name] is here to discuss her progress with her obesity treatment plan. She is following her eating plan approximately 100 % of the time and states she is exercising 60 minutes 6 times per week. Emily [name] continues to do well with weight loss. She had some sabotage from family and some increased temptations and celebration eating, but has done well with decreased emotional eating. Emily [name] is status post gastric bypass. Her weight is 205 lb (93 kg) today and has had a weight loss of 2 pounds over a period of 2 weeks since her last visit. She has lost 0 lbs since starting treatment with [name].  Vitamin D deficiency Emily [name] has a diagnosis of vitamin D deficiency. She is currently stable on vit D, not yet at goal and denies vomiting or muscle weakness.  Depression with emotional eating behaviors Emily [name] is struggling with emotional eating and using food for comfort to the extent that it is negatively impacting her health. She often snacks when she is not hungry. Emily [name] sometimes feels she is out of control and then feels guilty that she made poor food choices. Emily [name] started on Contrave and is doing well but noted some GI upset and noted some mild insomnia. Her mood is good and she notes decreased emotional eating. She has been working on behavior modification techniques to help reduce her emotional eating and has been somewhat successful. She shows no sign of suicidal or homicidal ideations.  Depression screen Emily [name] 2/9 09/21/2016  Decreased Interest 1  Down, Depressed, Hopeless 1  PHQ - 2 Score 2  Altered sleeping 0  Tired, decreased energy 1  Change in appetite 1  Feeling bad or failure about yourself  2  Trouble concentrating 0  Moving slowly or fidgety/restless 1  Suicidal thoughts 0  PHQ-9 Score 7     Wt Readings from Last 500 Encounters:  01/07/17 205 lb (93 kg)  12/23/16 207 lb (93.9 kg)    12/21/16 210 lb (95.3 kg)  12/08/16 201 lb (91.2 kg)  11/23/16 203 lb 9.6 oz (92.4 kg)  11/19/16 202 lb (91.6 kg)  11/04/16 204 lb (92.5 kg)  10/30/16 205 lb 6.4 oz (93.2 kg)  10/19/16 203 lb (92.1 kg)  10/07/16 207 lb (93.9 kg)  09/21/16 205 lb (93 kg)  06/17/14 193 lb (87.5 kg)     ALLERGIES: Allergies  Allergen Reactions  . Almond Oil Swelling  . Apple Swelling  . Kiwi Extract Swelling  . Soy Allergy Other (See Comments)  . Strawberry Extract Other (See Comments)    MEDICATIONS: Current Outpatient Prescriptions on File Prior to Visit  Medication Sig Dispense Refill  . albuterol (PROVENTIL HFA;VENTOLIN HFA) 108 (90 Base) MCG/ACT inhaler Inhale 2 puffs into the lungs every 6 (six) hours as needed for wheezing or shortness of breath. 1 Inhaler 0  . Boric Acid CRYS Place 600 mg vaginally at bedtime. Use vaginally every night for 1 month and then twice a week 2500 g 2  . clobetasol (TEMOVATE) 0.05 % external solution APPLY TO SCALP 3-4 TIMES PER WEEK AS NEEDED FOR ITCHING AND SCALING. NOT TO FACE.    . clobetasol ointment (TEMOVATE) 0.05 % Apply twice a day to thickest, most resistant areas, never on the face or in the skin folds.    . Cyanocobalamin (B-12) 2500 MCG SUBL Place 2,500 mcg under the tongue daily. 30 tablet 0  . desonide (DESOWEN) 0.05 % cream  Apply to affected areas on the face and ears twice daily for 2 weeks, then daily for 2 wks, then taper off. Repeat as needed. 30 g 2  . metroNIDAZOLE (FLAGYL) 500 MG tablet Take 1 tablet (500 mg total) by mouth 2 (two) times daily. 14 tablet 0  . Multiple Vitamin (MULTIVITAMIN) capsule Take 1 capsule by mouth daily.     . Naltrexone-Bupropion HCl ER (CONTRAVE) 8-90 MG TB12 Take 2 tablets by mouth 2 (two) times daily. 120 tablet 0  . norethindrone-ethinyl estradiol-iron (MICROGESTIN FE 1.5/30) 1.5-30 MG-MCG tablet Take 1 tablet by mouth daily. 3 Package 1   No current facility-administered medications on file prior to visit.      PAST MEDICAL HISTORY: Past Medical History:  Diagnosis Date  . Allergy   . Anemia   . B12 deficiency   . Back pain   . Gallbladder problem   . Joint pain   . Lactose intolerance   . Multiple food allergies   . Palpitations   . Psoriasis   . Swelling   . Vitamin D deficiency     PAST SURGICAL HISTORY: Past Surgical History:  Procedure Laterality Date  . CHOLECYSTECTOMY  2006  . COLPOSCOPY  2008  . ENDOMETRIAL BIOPSY  2017   negative per pt  . GASTRIC BYPASS  2007    SOCIAL HISTORY: Social History  Substance Use Topics  . Smoking status: Never Smoker  . Smokeless tobacco: Never Used  . Alcohol use Yes     Comment: 2-3 glasses a week    FAMILY HISTORY: Family History  Problem Relation Age of Onset  . Hypertension Mother   . Obesity Mother   . Hypertension Father   . Sleep apnea Father   . Obesity Father   . Alzheimer's disease Maternal Grandmother   . Leukemia Maternal Grandfather   . Alzheimer's disease Paternal Grandmother   . Cancer Paternal Grandfather   . Diabetes type II Maternal Aunt   . Stroke Maternal Aunt     ROS: Review of Systems  Constitutional: Positive for weight loss.  Gastrointestinal: Positive for diarrhea and nausea. Negative for vomiting.  Musculoskeletal:       Negative muscle weakness  Psychiatric/Behavioral: Negative for suicidal ideas. The patient has insomnia.     PHYSICAL EXAM: Blood pressure 113/69, pulse (!) 53, temperature 98.4 F (36.9 C), temperature source Oral, height 5\' 4"  (1.626 m), weight 205 lb (93 kg), SpO2 100 %. Body mass index is 35.19 kg/m. Physical Exam  Constitutional: She is oriented to person, place, and time. She appears well-developed and well-nourished.  Cardiovascular: Normal rate.   Musculoskeletal: Normal range of motion.  Neurological: She is oriented to person, place, and time.  Skin: Skin is warm and dry.  Psychiatric: She has a normal mood and affect. Her behavior is normal.  Vitals  reviewed.   RECENT LABS AND TESTS: BMET    Component Value Date/Time   NA 140 09/21/2016 1540   K 4.8 09/21/2016 1540   CL 100 09/21/2016 1540   CO2 25 09/21/2016 1540   GLUCOSE 85 09/21/2016 1540   BUN 14 09/21/2016 1540   CREATININE 0.68 09/21/2016 1540   CALCIUM 8.9 09/21/2016 1540   GFRNONAA 108 09/21/2016 1540   GFRAA 125 09/21/2016 1540   Lab Results  Component Value Date   HGBA1C 5.0 09/21/2016   Lab Results  Component Value Date   INSULIN 2.7 09/21/2016   CBC    Component Value Date/Time   WBC 5.9  09/21/2016 1540   RBC 4.66 09/21/2016 1540   HCT 41.1 09/21/2016 1540   MCV 88 09/21/2016 1540   MCH 29.0 09/21/2016 1540   MCHC 32.8 09/21/2016 1540   RDW 13.3 09/21/2016 1540   LYMPHSABS 2.2 09/21/2016 1540   EOSABS 0.1 09/21/2016 1540   BASOSABS 0.0 09/21/2016 1540   Iron/TIBC/Ferritin/ %Sat No results found for: IRON, TIBC, FERRITIN, IRONPCTSAT Lipid Panel     Component Value Date/Time   CHOL 192 09/21/2016 1540   TRIG 61 09/21/2016 1540   HDL 112 09/21/2016 1540   LDLCALC 68 09/21/2016 1540   Hepatic Function Panel     Component Value Date/Time   PROT 6.3 09/21/2016 1540   ALBUMIN 3.9 09/21/2016 1540   AST 31 09/21/2016 1540   ALT 30 09/21/2016 1540   ALKPHOS 77 09/21/2016 1540   BILITOT 1.6 (H) 09/21/2016 1540      Component Value Date/Time   TSH 2.210 09/21/2016 1540    ASSESSMENT AND PLAN: Vitamin D deficiency - Plan: Vitamin D, Ergocalciferol, (DRISDOL) 50000 units CAPS capsule  Other depression  Class 2 obesity without serious comorbidity with body mass index (BMI) of 35.0 to 35.9 in adult, unspecified obesity type  PLAN:  Vitamin D Deficiency Emily [name] was informed that low vitamin D levels contributes to fatigue and are associated with obesity, breast, and colon cancer. She agrees to continue to take prescription Vit D @50 ,000 IU every week, we will refill for 1 month and will follow up for routine testing of vitamin D, at least  2-3 times per year. She was informed of the risk of over-replacement of vitamin D and agrees to not increase her dose unless he discusses this with [name] first. Emily [name] agrees to follow up with our clinic in 2 weeks.  Depression with Emotional Eating Behaviors We discussed behavior modification techniques today to help Emily [name] deal with her emotional eating and depression. She has agreed to continue to take Contrave and slowly increase to 2 times per day and agreed to follow up as directed.  Obesity Emily [name] is currently in the action stage of change. As such, her goal is to continue with weight loss efforts She has agreed to keep a food journal with 1300 to 1600 calories and 80+ grams of protein daily Emily [name] has been instructed to work up to a goal of 150 minutes of combined cardio and strengthening exercise per week for weight loss and overall health benefits. We discussed the following Behavioral Modification Stratagies today: increasing lean protein intake and increasing fiber rich foods  Emily [name] has agreed to follow up with our clinic in 2 to 3 weeks. She was informed of the importance of frequent follow up visits to maximize her success with intensive lifestyle modifications for her multiple health conditions.  I, Doreene Nest, am acting as scribe for Dennard Nip, MD  I have reviewed the above documentation for accuracy and completeness, and I agree with the above. -Dennard Nip, MD

## 2017-01-26 ENCOUNTER — Ambulatory Visit (INDEPENDENT_AMBULATORY_CARE_PROVIDER_SITE_OTHER): Payer: 59 | Admitting: Family Medicine

## 2017-01-26 VITALS — BP 116/70 | HR 66 | Temp 98.4°F | Ht 64.0 in | Wt 202.0 lb

## 2017-01-26 DIAGNOSIS — Z6834 Body mass index (BMI) 34.0-34.9, adult: Secondary | ICD-10-CM | POA: Diagnosis not present

## 2017-01-26 DIAGNOSIS — Z9884 Bariatric surgery status: Secondary | ICD-10-CM

## 2017-01-26 DIAGNOSIS — E669 Obesity, unspecified: Secondary | ICD-10-CM

## 2017-01-26 DIAGNOSIS — E559 Vitamin D deficiency, unspecified: Secondary | ICD-10-CM | POA: Diagnosis not present

## 2017-01-26 DIAGNOSIS — F3289 Other specified depressive episodes: Secondary | ICD-10-CM

## 2017-01-26 DIAGNOSIS — E538 Deficiency of other specified B group vitamins: Secondary | ICD-10-CM | POA: Diagnosis not present

## 2017-01-26 MED ORDER — NALTREXONE-BUPROPION HCL ER 8-90 MG PO TB12: 2.0000 | ORAL_TABLET | Freq: Two times a day (BID) | ORAL | 0 refills | Status: DC

## 2017-01-26 MED ORDER — VITAMIN D (ERGOCALCIFEROL) 1.25 MG (50000 UNIT) PO CAPS: 50000.0000 [IU] | ORAL_CAPSULE | ORAL | 0 refills | Status: DC

## 2017-01-26 MED FILL — CONTRAVE ER 8-90 MG TABLET: 8-90 | 30 days supply | Qty: 120 | Fill #0

## 2017-01-26 NOTE — Progress Notes (Signed)
Office: [phone]  /  Fax: [phone]   HPI:   Chief Complaint: OBESITY Emily [name] is here to discuss her progress with her obesity treatment plan. She is following her eating plan approximately 100 % of the time and states she is exercising 60 minutes 6 times per week. Emily [name] is doing well on Contrave and notes it is easier to follow her journaling plan. She is exercising well. Her weight is 202 lb (91.6 kg) today and has had a weight loss of 3 pounds over a period of 2 to 3 weeks since her last visit. She has lost 3 lbs since starting treatment with [name].  Depression with emotional eating behaviors Emily [name] is struggling with emotional eating and using food for comfort to the extent that it is negatively impacting her health. She often snacks when she is not hungry. Emily [name] sometimes feels she is out of control and then feels guilty that she made poor food choices. She feels her mood is good on Contrave and notes decreased emotional eating and she feels she is more in control. She has been working on behavior modification techniques to help reduce her emotional eating and has been somewhat successful. She has no insomnia and blood pressure is stable. She shows no sign of suicidal or homicidal ideations.  Vitamin D deficiency Emily [name] has a diagnosis of vitamin D deficiency. She is currently stable on vit D. Fatigue is improving and she denies nausea, vomiting or muscle weakness.  B12 Deficiency Emily [name] has a diagnosis of B12 insufficiency and notes fatigue is improving. She is on sublingual B12 status post weight loss surgery. This is a new diagnosis. Emily [name] is not a vegetarian and does not have a previous diagnosis of pernicious anemia. She has a history of weight loss surgery.   Depression screen [name] 2/9 09/21/2016  Decreased Interest 1  Down, Depressed, Hopeless 1  PHQ - 2 Score 2  Altered sleeping 0  Tired, decreased energy 1  Change in appetite 1  Feeling bad or failure  about yourself  2  Trouble concentrating 0  Moving slowly or fidgety/restless 1  Suicidal thoughts 0  PHQ-9 Score 7     Wt Readings from Last 500 Encounters:  01/26/17 202 lb (91.6 kg)  01/07/17 205 lb (93 kg)  12/23/16 207 lb (93.9 kg)  12/21/16 210 lb (95.3 kg)  12/08/16 201 lb (91.2 kg)  11/23/16 203 lb 9.6 oz (92.4 kg)  11/19/16 202 lb (91.6 kg)  11/04/16 204 lb (92.5 kg)  10/30/16 205 lb 6.4 oz (93.2 kg)  10/19/16 203 lb (92.1 kg)  10/07/16 207 lb (93.9 kg)  09/21/16 205 lb (93 kg)  06/17/14 193 lb (87.5 kg)     ALLERGIES: Allergies  Allergen Reactions  . Almond Oil Swelling  . Apple Swelling  . Kiwi Extract Swelling  . Soy Allergy Other (See Comments)  . Strawberry Extract Other (See Comments)    MEDICATIONS: Current Outpatient Prescriptions on File Prior to Visit  Medication Sig Dispense Refill  . albuterol (PROVENTIL HFA;VENTOLIN HFA) 108 (90 Base) MCG/ACT inhaler Inhale 2 puffs into the lungs every 6 (six) hours as needed for wheezing or shortness of breath. 1 Inhaler 0  . clobetasol (TEMOVATE) 0.05 % external solution APPLY TO SCALP 3-4 TIMES PER WEEK AS NEEDED FOR ITCHING AND SCALING. NOT TO FACE.    . clobetasol ointment (TEMOVATE) 0.05 % Apply twice a day to thickest, most resistant areas, never on the face or in the skin folds.    Marland Kitchen  Cyanocobalamin (B-12) 2500 MCG SUBL Place 2,500 mcg under the tongue daily. 30 tablet 0  . desonide (DESOWEN) 0.05 % cream Apply to affected areas on the face and ears twice daily for 2 weeks, then daily for 2 wks, then taper off. Repeat as needed. 30 g 2  . metroNIDAZOLE (FLAGYL) 500 MG tablet Take 1 tablet (500 mg total) by mouth 2 (two) times daily. 14 tablet 0  . Multiple Vitamin (MULTIVITAMIN) capsule Take 1 capsule by mouth daily.     . norethindrone-ethinyl estradiol-iron (MICROGESTIN FE 1.5/30) 1.5-30 MG-MCG tablet Take 1 tablet by mouth daily. 3 Package 1   No current facility-administered medications on file prior to  visit.     PAST MEDICAL HISTORY: Past Medical History:  Diagnosis Date  . Allergy   . Anemia   . B12 deficiency   . Back pain   . Gallbladder problem   . Joint pain   . Lactose intolerance   . Multiple food allergies   . Palpitations   . Psoriasis   . Swelling   . Vitamin D deficiency     PAST SURGICAL HISTORY: Past Surgical History:  Procedure Laterality Date  . CHOLECYSTECTOMY  2006  . COLPOSCOPY  2008  . ENDOMETRIAL BIOPSY  2017   negative per pt  . GASTRIC BYPASS  2007    SOCIAL HISTORY: Social History  Substance Use Topics  . Smoking status: Never Smoker  . Smokeless tobacco: Never Used  . Alcohol use Yes     Comment: 2-3 glasses a week    FAMILY HISTORY: Family History  Problem Relation Age of Onset  . Hypertension Mother   . Obesity Mother   . Hypertension Father   . Sleep apnea Father   . Obesity Father   . Alzheimer's disease Maternal Grandmother   . Leukemia Maternal Grandfather   . Alzheimer's disease Paternal Grandmother   . Cancer Paternal Grandfather   . Diabetes type II Maternal Aunt   . Stroke Maternal Aunt     ROS: Review of Systems  Constitutional: Positive for malaise/fatigue and weight loss.  Gastrointestinal: Negative for nausea and vomiting.  Musculoskeletal:       Negative muscle weakness  Psychiatric/Behavioral: Negative for suicidal ideas. The patient does not have insomnia.     PHYSICAL EXAM: Blood pressure 116/70, pulse 66, temperature 98.4 F (36.9 C), temperature source Oral, height 5\' 4"  (1.626 m), weight 202 lb (91.6 kg), SpO2 99 %. Body mass index is 34.67 kg/m. Physical Exam  Constitutional: She is oriented to person, place, and time. She appears well-developed and well-nourished.  Cardiovascular: Normal rate.   Pulmonary/Chest: Effort normal.  Musculoskeletal: Normal range of motion.  Neurological: She is oriented to person, place, and time.  Skin: Skin is warm and dry.  Psychiatric: Her behavior is normal.    Vitals reviewed.   RECENT LABS AND TESTS: BMET    Component Value Date/Time   NA 140 09/21/2016 1540   K 4.8 09/21/2016 1540   CL 100 09/21/2016 1540   CO2 25 09/21/2016 1540   GLUCOSE 85 09/21/2016 1540   BUN 14 09/21/2016 1540   CREATININE 0.68 09/21/2016 1540   CALCIUM 8.9 09/21/2016 1540   GFRNONAA 108 09/21/2016 1540   GFRAA 125 09/21/2016 1540   Lab Results  Component Value Date   HGBA1C 5.0 09/21/2016   Lab Results  Component Value Date   INSULIN 2.7 09/21/2016   CBC    Component Value Date/Time   WBC 5.9 09/21/2016  1540   RBC 4.66 09/21/2016 1540   HCT 41.1 09/21/2016 1540   MCV 88 09/21/2016 1540   MCH 29.0 09/21/2016 1540   MCHC 32.8 09/21/2016 1540   RDW 13.3 09/21/2016 1540   LYMPHSABS 2.2 09/21/2016 1540   EOSABS 0.1 09/21/2016 1540   BASOSABS 0.0 09/21/2016 1540   Iron/TIBC/Ferritin/ %Sat No results found for: IRON, TIBC, FERRITIN, IRONPCTSAT Lipid Panel     Component Value Date/Time   CHOL 192 09/21/2016 1540   TRIG 61 09/21/2016 1540   HDL 112 09/21/2016 1540   LDLCALC 68 09/21/2016 1540   Hepatic Function Panel     Component Value Date/Time   PROT 6.3 09/21/2016 1540   ALBUMIN 3.9 09/21/2016 1540   AST 31 09/21/2016 1540   ALT 30 09/21/2016 1540   ALKPHOS 77 09/21/2016 1540   BILITOT 1.6 (H) 09/21/2016 1540      Component Value Date/Time   TSH 2.210 09/21/2016 1540    ASSESSMENT AND PLAN: Other depression - Plan: Naltrexone-Bupropion HCl ER (CONTRAVE) 8-90 MG TB12  Vitamin D deficiency - Plan: VITAMIN D 25 Hydroxy (Vit-D Deficiency, Fractures), Vitamin D, Ergocalciferol, (DRISDOL) 50000 units CAPS capsule  B12 nutritional deficiency - Plan: Vitamin B12  Status following surgery for weight loss - Plan: Comprehensive metabolic panel, CBC with Differential/Platelet, Hemoglobin A1c, Insulin, random, Lipid Panel With LDL/HDL Ratio, Folate, TSH, T4, free, T3, Parathyroid hormone, intact (no Ca)  Class 1 obesity without serious  comorbidity with body mass index (BMI) of 34.0 to 34.9 in adult, unspecified obesity type - Plan: Naltrexone-Bupropion HCl ER (CONTRAVE) 8-90 MG TB12  PLAN:  Depression with Emotional Eating Behaviors We discussed behavior modification techniques today to help Tailynn deal with her emotional eating and depression. She has agreed to continue to take Contrave 8-90 mg TB12  Bid #120 with no refills and she agreed to follow up as directed.  Vitamin D Deficiency Merrianne was informed that low vitamin D levels contributes to fatigue and are associated with obesity, breast, and colon cancer. She agrees to continue to take prescription Vit D @50 ,000 IU every week, we will refill for 1 month and will follow up for routine testing of vitamin D, at least 2-3 times per year. She was informed of the risk of over-replacement of vitamin D and agrees to not increase her dose unless he discusses this with [name] first. Mahek agrees to follow up with our clinic in 2 weeks.  B12 Deficiency Shekia will work on increasing B12 rich foods in her diet. B12 supplementation was not prescribed today. We will plan on rechecking labs and follow.  Obesity Leesha is currently in the action stage of change. As such, her goal is to continue with weight loss efforts She has agreed to portion control better and make smarter food choices, such as increase vegetables and decrease simple carbohydrates  Iana has been instructed to work up to a goal of 150 minutes of combined cardio and strengthening exercise per week for weight loss and overall health benefits. We discussed the following Behavioral Modification Stratagies today: increasing lean protein intake We discussed various medication options to help [place] with her weight loss efforts and we both agreed to continue Contrave, we will refill for 1 month.  Adyline has agreed to follow up with our clinic in 3 weeks. She was informed of the importance of frequent follow  up visits to maximize her success with intensive lifestyle modifications for her multiple health conditions.  Corey Skains, am acting as  scribe for Dennard Nip, MD  I have reviewed the above documentation for accuracy and completeness, and I agree with the above. -Dennard Nip, MD

## 2017-01-27 LAB — CBC WITH DIFFERENTIAL/PLATELET
BASOS ABS: 0 10*3/uL (ref 0.0–0.2)
Basos: 1 %
EOS (ABSOLUTE): 0.2 10*3/uL (ref 0.0–0.4)
EOS: 5 %
HEMOGLOBIN: 13.7 g/dL (ref 11.1–15.9)
Hematocrit: 40.7 % (ref 34.0–46.6)
Immature Grans (Abs): 0 10*3/uL (ref 0.0–0.1)
Immature Granulocytes: 0 %
LYMPHS ABS: 1.6 10*3/uL (ref 0.7–3.1)
Lymphs: 39 %
MCH: 29.1 pg (ref 26.6–33.0)
MCHC: 33.7 g/dL (ref 31.5–35.7)
MCV: 87 fL (ref 79–97)
MONOCYTES: 7 %
MONOS ABS: 0.3 10*3/uL (ref 0.1–0.9)
NEUTROS ABS: 2 10*3/uL (ref 1.4–7.0)
Neutrophils: 48 %
Platelets: 275 10*3/uL (ref 150–379)
RBC: 4.7 x10E6/uL (ref 3.77–5.28)
RDW: 13.6 % (ref 12.3–15.4)
WBC: 4.1 10*3/uL (ref 3.4–10.8)

## 2017-01-27 LAB — COMPREHENSIVE METABOLIC PANEL
ALT: 23 IU/L (ref 0–32)
AST: 23 IU/L (ref 0–40)
Albumin/Globulin Ratio: 1.5 (ref 1.2–2.2)
Albumin: 3.8 g/dL (ref 3.5–5.5)
Alkaline Phosphatase: 74 IU/L (ref 39–117)
BILIRUBIN TOTAL: 1.3 mg/dL — AB (ref 0.0–1.2)
BUN / CREAT RATIO: 22 (ref 9–23)
BUN: 18 mg/dL (ref 6–24)
CHLORIDE: 101 mmol/L (ref 96–106)
CO2: 25 mmol/L (ref 18–29)
Calcium: 9.1 mg/dL (ref 8.7–10.2)
Creatinine, Ser: 0.82 mg/dL (ref 0.57–1.00)
GFR calc non Af Amer: 88 mL/min/{1.73_m2} (ref >59)
GFR, EST AFRICAN AMERICAN: 101 mL/min/{1.73_m2} (ref >59)
GLUCOSE: 88 mg/dL (ref 65–99)
Globulin, Total: 2.5 g/dL (ref 1.5–4.5)
Potassium: 4 mmol/L (ref 3.5–5.2)
SODIUM: 141 mmol/L (ref 134–144)
TOTAL PROTEIN: 6.3 g/dL (ref 6.0–8.5)

## 2017-01-27 LAB — LIPID PANEL WITH LDL/HDL RATIO
Cholesterol, Total: 190 mg/dL (ref 100–199)
HDL: 116 mg/dL (ref >39)
LDL Calculated: 62 mg/dL (ref 0–99)
LDL/HDL RATIO: 0.5 ratio (ref 0.0–3.2)
TRIGLYCERIDES: 58 mg/dL (ref 0–149)
VLDL Cholesterol Cal: 12 mg/dL (ref 5–40)

## 2017-01-27 LAB — TSH: TSH: 4.56 u[IU]/mL — ABNORMAL HIGH (ref 0.450–4.500)

## 2017-01-27 LAB — FOLATE: Folate: 5.1 ng/mL (ref >3.0)

## 2017-01-27 LAB — VITAMIN B12: VITAMIN B 12: 437 pg/mL (ref 232–1245)

## 2017-01-27 LAB — T3: T3, Total: 102 ng/dL (ref 71–180)

## 2017-01-27 LAB — INSULIN, RANDOM: INSULIN: 3.5 u[IU]/mL (ref 2.6–24.9)

## 2017-01-27 LAB — HEMOGLOBIN A1C
Est. average glucose Bld gHb Est-mCnc: 94 mg/dL
Hgb A1c MFr Bld: 4.9 % (ref 4.8–5.6)

## 2017-01-27 LAB — T4, FREE: Free T4: 1.18 ng/dL (ref 0.82–1.77)

## 2017-01-27 LAB — VITAMIN D 25 HYDROXY (VIT D DEFICIENCY, FRACTURES): Vit D, 25-Hydroxy: 29.9 ng/mL — ABNORMAL LOW (ref 30.0–100.0)

## 2017-01-27 LAB — PARATHYROID HORMONE, INTACT (NO CA): PTH: 39 pg/mL (ref 15–65)

## 2017-02-08 ENCOUNTER — Encounter: Payer: Self-pay | Admitting: Obstetrics & Gynecology

## 2017-02-08 ENCOUNTER — Ambulatory Visit (INDEPENDENT_AMBULATORY_CARE_PROVIDER_SITE_OTHER): Payer: 59 | Admitting: Obstetrics & Gynecology

## 2017-02-08 VITALS — BP 95/43 | HR 68 | Ht 64.0 in | Wt 207.0 lb

## 2017-02-08 DIAGNOSIS — B9689 Other specified bacterial agents as the cause of diseases classified elsewhere: Secondary | ICD-10-CM | POA: Diagnosis not present

## 2017-02-08 DIAGNOSIS — N760 Acute vaginitis: Secondary | ICD-10-CM | POA: Diagnosis not present

## 2017-02-08 NOTE — Patient Instructions (Signed)
Bacterial Vaginosis Bacterial vaginosis is a vaginal infection that occurs when the normal balance of bacteria in the vagina is disrupted. It results from an overgrowth of certain bacteria. This is the most common vaginal infection among women ages 15-44. Because bacterial vaginosis increases your risk for STIs (sexually transmitted infections), getting treated can help reduce your risk for chlamydia, gonorrhea, herpes, and HIV (human immunodeficiency virus). Treatment is also important for preventing complications in pregnant women, because this condition can cause an early (premature) delivery. What are the causes? This condition is caused by an increase in harmful bacteria that are normally present in small amounts in the vagina. However, the reason that the condition develops is not fully understood. What increases the risk? The following factors may make you more likely to develop this condition:  Having a new sexual partner or multiple sexual partners.  Having unprotected sex.  Douching.  Having an intrauterine device (IUD).  Smoking.  Drug and alcohol abuse.  Taking certain antibiotic medicines.  Being pregnant.  You cannot get bacterial vaginosis from toilet seats, bedding, swimming pools, or contact with objects around you. What are the signs or symptoms? Symptoms of this condition include:  Grey or white vaginal discharge. The discharge can also be watery or foamy.  A fish-like odor with discharge, especially after sexual intercourse or during menstruation.  Itching in and around the vagina.  Burning or pain with urination.  Some women with bacterial vaginosis have no signs or symptoms. How is this diagnosed? This condition is diagnosed based on:  Your medical history.  A physical exam of the vagina.  Testing a sample of vaginal fluid under a microscope to look for a large amount of bad bacteria or abnormal cells. Your health care provider may use a cotton swab  or a small wooden spatula to collect the sample.  How is this treated? This condition is treated with antibiotics. These may be given as a pill, a vaginal cream, or a medicine that is put into the vagina (suppository). If the condition comes back after treatment, a second round of antibiotics may be needed. Follow these instructions at home: Medicines  Take over-the-counter and prescription medicines only as told by your health care provider.  Take or use your antibiotic as told by your health care provider. Do not stop taking or using the antibiotic even if you start to feel better. General instructions  If you have a female sexual partner, tell her that you have a vaginal infection. She should see her health care provider and be treated if she has symptoms. If you have a female sexual partner, he does not need treatment.  During treatment: ? Avoid sexual activity until you finish treatment. ? Do not douche. ? Avoid alcohol as directed by your health care provider. ? Avoid breastfeeding as directed by your health care provider.  Drink enough water and fluids to keep your urine clear or pale yellow.  Keep the area around your vagina and rectum clean. ? Wash the area daily with warm water. ? Wipe yourself from front to back after using the toilet.  Keep all follow-up visits as told by your health care provider. This is important. How is this prevented?  Do not douche.  Wash the outside of your vagina with warm water only.  Use protection when having sex. This includes latex condoms and dental dams.  Limit how many sexual partners you have. To help prevent bacterial vaginosis, it is best to have sex with just   one partner (monogamous).  Make sure you and your sexual partner are tested for STIs.  Wear cotton or cotton-lined underwear.  Avoid wearing tight pants and pantyhose, especially during summer.  Limit the amount of alcohol that you drink.  Do not use any products that  contain nicotine or tobacco, such as cigarettes and e-cigarettes. If you need help quitting, ask your health care provider.  Do not use illegal drugs. Where to find more information:  Centers for Disease Control and Prevention: www.cdc.gov/std  American Sexual Health Association (ASHA): www.ashastd.org  [place] Department of Health and Human Services, Office on Women's Health: www.womenshealth.gov/ or https://www.womenshealth.gov/a-z-topics/bacterial-vaginosis Contact a health care provider if:  Your symptoms do not improve, even after treatment.  You have more discharge or pain when urinating.  You have a fever.  You have pain in your abdomen.  You have pain during sex.  You have vaginal bleeding between periods. Summary  Bacterial vaginosis is a vaginal infection that occurs when the normal balance of bacteria in the vagina is disrupted.  Because bacterial vaginosis increases your risk for STIs (sexually transmitted infections), getting treated can help reduce your risk for chlamydia, gonorrhea, herpes, and HIV (human immunodeficiency virus). Treatment is also important for preventing complications in pregnant women, because the condition can cause an early (premature) delivery.  This condition is treated with antibiotic medicines. These may be given as a pill, a vaginal cream, or a medicine that is put into the vagina (suppository). This information is not intended to replace advice given to you by your health care provider. Make sure you discuss any questions you have with your health care provider. Document Released: 09/21/2005 Document Revised: 06/06/2016 Document Reviewed: 06/06/2016 Elsevier Interactive Patient Education  2017 [employer]  

## 2017-02-08 NOTE — Progress Notes (Signed)
History:  41 y.o. G0P0000 here today for eval of recurrent BV. She has been using Boric acid with relief of sx. She had several days after it stopped and her sx retuned.  She is doing all of the steps to decrease the risks of recurrent BV.  Pt denies any other sx.   The following portions of the patient's history were reviewed and updated as appropriate: allergies, current medications, past family history, past medical history, past social history, past surgical history and problem list.  Review of Systems:  Pertinent items are noted in HPI.   Objective:  Physical Exam Blood pressure (!) 95/43, pulse 68, height 5\' 4"  (1.626 m), weight 207 lb (93.9 kg), last menstrual period 01/16/2017. BP (!) 95/43   Pulse 68   Ht 5\' 4"  (1.626 m)   Wt 207 lb (93.9 kg)   LMP 01/16/2017 (Approximate)   BMI 35.53 kg/m  CONSTITUTIONAL: Well-developed, well-nourished female in no acute distress.  HENT:  Normocephalic, atraumatic EYES: Conjunctivae and EOM are normal. No scleral icterus.  NECK: Normal range of motion SKIN: Skin is warm and dry. No rash noted. Not diaphoretic.No pallor. [place]: Alert and oriented to person, place, and time. Normal coordination.     Assessment & Plan:  Recurrent BV- improved  Keep Boric acid 2-3x/week f/u in 3 months or sooner prn  Emily [name] L. Harraway-Smith, M.D., Emily [name]

## 2017-02-08 NOTE — Progress Notes (Signed)
Pt states that she has no vaginal discharge/signs of BV when using Boric Acid. If she stops Boric Acid then the symptoms occur. PT uses Boric Acid about three times a week.

## 2017-02-15 ENCOUNTER — Ambulatory Visit (INDEPENDENT_AMBULATORY_CARE_PROVIDER_SITE_OTHER): Payer: 59 | Admitting: Family Medicine

## 2017-02-26 ENCOUNTER — Telehealth: Payer: Self-pay | Admitting: *Deleted

## 2017-02-26 NOTE — Telephone Encounter (Signed)
Received fax from [facility] requesting refill of clobetasol 0.05%. Apply to scalp 3-4 times weekly for itching and scaling (not on face).  Please advise?

## 2017-02-28 MED ORDER — CLOBETASOL PROPIONATE 0.05 % EX SOLN
CUTANEOUS | 2 refills | Status: DC
Start: 2017-02-28 — End: 2019-05-26

## 2017-03-02 ENCOUNTER — Ambulatory Visit (INDEPENDENT_AMBULATORY_CARE_PROVIDER_SITE_OTHER): Payer: 59 | Admitting: Family Medicine

## 2017-03-02 VITALS — BP 112/66 | HR 62 | Temp 98.4°F | Ht 64.0 in | Wt 203.0 lb

## 2017-03-02 DIAGNOSIS — F3289 Other specified depressive episodes: Secondary | ICD-10-CM | POA: Diagnosis not present

## 2017-03-02 DIAGNOSIS — E559 Vitamin D deficiency, unspecified: Secondary | ICD-10-CM | POA: Diagnosis not present

## 2017-03-02 DIAGNOSIS — Z6835 Body mass index (BMI) 35.0-35.9, adult: Secondary | ICD-10-CM | POA: Diagnosis not present

## 2017-03-02 DIAGNOSIS — E669 Obesity, unspecified: Secondary | ICD-10-CM | POA: Diagnosis not present

## 2017-03-02 MED ORDER — VITAMIN D (ERGOCALCIFEROL) 1.25 MG (50000 UNIT) PO CAPS: 50000.0000 [IU] | ORAL_CAPSULE | ORAL | 0 refills | Status: DC

## 2017-03-02 MED ORDER — NALTREXONE-BUPROPION HCL ER 8-90 MG PO TB12: 2.0000 | ORAL_TABLET | Freq: Two times a day (BID) | ORAL | 0 refills | Status: DC

## 2017-03-02 MED FILL — VIT D2 1.25 MG (50,000 UNIT: 1.25 MG | 28 days supply | Qty: 4 | Fill #0

## 2017-03-02 MED FILL — CLOBETASOL 0.05% SOLUTION: 0.05 | 30 days supply | Qty: 50 | Fill #0

## 2017-03-02 NOTE — Progress Notes (Signed)
Office: [phone]  /  Fax: [phone]   HPI:   Chief Complaint: OBESITY Emily [name] is here to discuss her progress with her obesity treatment plan. She is on the keep a food journal and is following her eating plan approximately 99 % of the time. She states she is exercising gym 60 minutes 5 times per week. [name] is retaining some fluid today but notes some increase in sabotage from family/friends/co-workers. She maintained weight well but had increased travelling and eating out and is ready to get back on track. Her weight is 203 lb (92.1 kg) today and has had a weight gain of 1 pound over a period of 5 weeks since her last visit. She has lost 2 lbs since starting treatment with [name].  Vitamin D deficiency [name] has a diagnosis of vitamin D deficiency. She is currently stable on vit D, slightly improved, not yet at goal. She denies nausea, vomiting or muscle weakness.  Depression with emotional eating behaviors [name] is on Contrave full dose for the last month. She denies insomnia or xerostomia. She does still note vivid dreams but are not disturbing. [name] struggles with emotional eating and using food for comfort to the extent that it is negatively impacting her health. She often snacks when she is not hungry. [name] sometimes feels she is out of control and then feels guilty that she made poor food choices. She has been working on behavior modification techniques to help reduce her emotional eating and has been somewhat successful. She shows no sign of suicidal or homicidal ideations.  Depression screen PHQ 2/9 09/21/2016  Decreased Interest 1  Down, Depressed, Hopeless 1  PHQ - 2 Score 2  Altered sleeping 0  Tired, decreased energy 1  Change in appetite 1  Feeling bad or failure about yourself  2  Trouble concentrating 0  Moving slowly or fidgety/restless 1  Suicidal thoughts 0  PHQ-9 Score 7      ALLERGIES: Allergies  Allergen Reactions  . Almond Oil  Swelling  . Apple Swelling  . Kiwi Extract Swelling  . Soy Allergy Other (See Comments)  . Strawberry Extract Other (See Comments)    MEDICATIONS: Current Outpatient Prescriptions on File Prior to Visit  Medication Sig Dispense Refill  . albuterol (PROVENTIL HFA;VENTOLIN HFA) 108 (90 Base) MCG/ACT inhaler Inhale 2 puffs into the lungs every 6 (six) hours as needed for wheezing or shortness of breath. 1 Inhaler 0  . BORIC ACID EX Apply topically.    . clobetasol (TEMOVATE) 0.05 % external solution APPLY TO SCALP 3-4 TIMES PER WEEK AS NEEDED FOR ITCHING AND SCALING. NOT TO FACE. 50 mL 2  . Cyanocobalamin (B-12) 2500 MCG SUBL Place 2,500 mcg under the tongue daily. 30 tablet 0  . Multiple Vitamin (MULTIVITAMIN) capsule Take 1 capsule by mouth daily.     . norethindrone-ethinyl estradiol-iron (MICROGESTIN FE 1.5/30) 1.5-30 MG-MCG tablet Take 1 tablet by mouth daily. 3 Package 1   No current facility-administered medications on file prior to visit.     PAST MEDICAL HISTORY: Past Medical History:  Diagnosis Date  . Allergy   . Anemia   . B12 deficiency   . Back pain   . Gallbladder problem   . Joint pain   . Lactose intolerance   . Multiple food allergies   . Palpitations   . Psoriasis   . Swelling   . Vitamin D deficiency     PAST SURGICAL HISTORY: Past Surgical History:  Procedure Laterality Date  .  CHOLECYSTECTOMY  2006  . COLPOSCOPY  2008  . ENDOMETRIAL BIOPSY  2017   negative per pt  . GASTRIC BYPASS  2007    SOCIAL HISTORY: Social History  Substance Use Topics  . Smoking status: Never Smoker  . Smokeless tobacco: Never Used  . Alcohol use Yes     Comment: 2-3 glasses a week    FAMILY HISTORY: Family History  Problem Relation Age of Onset  . Hypertension Mother   . Obesity Mother   . Hypertension Father   . Sleep apnea Father   . Obesity Father   . Alzheimer's disease Maternal Grandmother   . Leukemia Maternal Grandfather   . Alzheimer's disease  Paternal Grandmother   . Cancer Paternal Grandfather   . Diabetes type II Maternal Aunt   . Stroke Maternal Aunt     ROS: Review of Systems  Constitutional: Negative for weight loss.  HENT:       Negative xerostomia  Gastrointestinal: Negative for nausea and vomiting.  Musculoskeletal:       Negative muscle weakness  Psychiatric/Behavioral: Positive for depression. Negative for suicidal ideas. The patient does not have insomnia.     PHYSICAL EXAM: Blood pressure 112/66, pulse 62, temperature 98.4 F (36.9 C), temperature source Oral, height 5\' 4"  (1.626 m), weight 203 lb (92.1 kg), SpO2 100 %. Body mass index is 34.84 kg/m. Physical Exam  Constitutional: She is oriented to person, place, and time. She appears well-developed and well-nourished.  Cardiovascular: Normal rate.   Pulmonary/Chest: Effort normal.  Musculoskeletal: Normal range of motion.  Neurological: She is oriented to person, place, and time.  Skin: Skin is warm and dry.  Vitals reviewed.   RECENT LABS AND TESTS: BMET    Component Value Date/Time   NA 141 01/26/2017 0820   K 4.0 01/26/2017 0820   CL 101 01/26/2017 0820   CO2 25 01/26/2017 0820   GLUCOSE 88 01/26/2017 0820   BUN 18 01/26/2017 0820   CREATININE 0.82 01/26/2017 0820   CALCIUM 9.1 01/26/2017 0820   GFRNONAA 88 01/26/2017 0820   GFRAA 101 01/26/2017 0820   Lab Results  Component Value Date   HGBA1C 4.9 01/26/2017   HGBA1C 5.0 09/21/2016   Lab Results  Component Value Date   INSULIN 3.5 01/26/2017   INSULIN 2.7 09/21/2016   CBC    Component Value Date/Time   WBC 4.1 01/26/2017 0820   RBC 4.70 01/26/2017 0820   HCT 40.7 01/26/2017 0820   PLT 275 01/26/2017 0820   MCV 87 01/26/2017 0820   MCH 29.1 01/26/2017 0820   MCHC 33.7 01/26/2017 0820   RDW 13.6 01/26/2017 0820   LYMPHSABS 1.6 01/26/2017 0820   EOSABS 0.2 01/26/2017 0820   BASOSABS 0.0 01/26/2017 0820   Iron/TIBC/Ferritin/ %Sat No results found for: IRON, TIBC,  FERRITIN, IRONPCTSAT Lipid Panel     Component Value Date/Time   CHOL 190 01/26/2017 0820   TRIG 58 01/26/2017 0820   HDL 116 01/26/2017 0820   LDLCALC 62 01/26/2017 0820   Hepatic Function Panel     Component Value Date/Time   PROT 6.3 01/26/2017 0820   ALBUMIN 3.8 01/26/2017 0820   AST 23 01/26/2017 0820   ALT 23 01/26/2017 0820   ALKPHOS 74 01/26/2017 0820   BILITOT 1.3 (H) 01/26/2017 0820      Component Value Date/Time   TSH 4.560 (H) 01/26/2017 0820   TSH 2.210 09/21/2016 1540    ASSESSMENT AND PLAN: Vitamin D deficiency - Plan: Vitamin  D, Ergocalciferol, (DRISDOL) 50000 units CAPS capsule  Other depression - Plan: Naltrexone-Bupropion HCl ER (CONTRAVE) 8-90 MG TB12  Class 2 obesity without serious comorbidity with body mass index (BMI) of 35.0 to 35.9 in adult, unspecified obesity type - Plan: Naltrexone-Bupropion HCl ER (CONTRAVE) 8-90 MG TB12  PLAN:  Vitamin D Deficiency Tyleah was informed that low vitamin D levels contributes to fatigue and are associated with obesity, breast, and colon cancer. She agrees to continue to take prescription Vit D @50 ,000 IU every week, we will refill for 1 month and will re-check labs in 2 weeks and will follow up for routine testing of vitamin D, at least 2-3 times per year. She was informed of the risk of over-replacement of vitamin D and agrees to not increase her dose unless he discusses this with [name] first. Leathia agrees to follow up with our clinic in 2 weeks.  Depression with Emotional Eating Behaviors We discussed behavior modification techniques today to help Tristina deal with her emotional eating and depression. She has agreed to continue to take Contrave, we will refill for 1 month and she agreed to follow up as directed.  Obesity Kym is currently in the action stage of change. As such, her goal is to continue with weight loss efforts She has agreed to get back to follow strict Category 2 plan Mariea has been  instructed to work up to a goal of 150 minutes of combined cardio and strengthening exercise per week for weight loss and overall health benefits. We discussed the following Behavioral Modification Strategies today: increasing lean protein intake and work on meal planning and easy cooking plans  Vona is status post weight loss surgery. We will refill Contrave for 1 month and she agrees to follow up with our clinic in 2 weeks.  Sanita has agreed to follow up with our clinic in 2 weeks. She was informed of the importance of frequent follow up visits to maximize her success with intensive lifestyle modifications for her multiple health conditions.  I, Doreene Nest, am acting as scribe for Dennard Nip, MD  I have reviewed the above documentation for accuracy and completeness, and I agree with the above. -Dennard Nip, MD  OBESITY BEHAVIORAL INTERVENTION VISIT  Today's visit was # 10 out of 22.  Starting weight: 205 lbs Starting date: 09/21/16 Today's weight : 203 lbs  Today's date: 03/02/2017 Total lbs lost to date: 2  (Patients must lose 7 lbs in the first 6 months to continue with counseling)   ASK: We discussed the diagnosis of obesity with Sabas Sous today and Ailynn agreed to give [name] permission to discuss obesity behavioral modification therapy today.  ASSESS: Theone has the diagnosis of obesity and her BMI today is 34.9 Nayda is in the action stage of change   ADVISE: Shiori was educated on the multiple health risks of obesity as well as the benefit of weight loss to improve her health. She was advised of the need for long term treatment and the importance of lifestyle modifications.  AGREE: Multiple dietary modification options and treatment options were discussed and Edit agreed to get back to follow strict Category 2 plan We discussed the following Behavioral Modification Strategies today: increasing lean protein intake and work on meal planning and  easy cooking plans

## 2017-03-05 MED FILL — LARIN FE 1.5-30 TABLET: 1.5-30 | 84 days supply | Qty: 84 | Fill #1

## 2017-03-16 ENCOUNTER — Ambulatory Visit (INDEPENDENT_AMBULATORY_CARE_PROVIDER_SITE_OTHER): Payer: 59 | Admitting: Family Medicine

## 2017-03-16 VITALS — BP 108/67 | HR 55 | Temp 98.1°F | Ht 64.0 in | Wt 208.0 lb

## 2017-03-16 DIAGNOSIS — Z6835 Body mass index (BMI) 35.0-35.9, adult: Secondary | ICD-10-CM | POA: Diagnosis not present

## 2017-03-16 DIAGNOSIS — E669 Obesity, unspecified: Secondary | ICD-10-CM | POA: Diagnosis not present

## 2017-03-16 DIAGNOSIS — E559 Vitamin D deficiency, unspecified: Secondary | ICD-10-CM | POA: Diagnosis not present

## 2017-03-16 NOTE — Progress Notes (Signed)
Office: [phone]  /  Fax: [phone]   HPI:   Chief Complaint: OBESITY Emily [name] is here to discuss her progress with her obesity treatment plan. She is on the Category 2 plan and is following her eating plan approximately 90 % of the time. She states she is working with trainer, [employer] for 60 minutes 6 times per week. [name] has been eating out more and is not journaling as much but is ready to get back on track. Her weight is 208 lb (94.3 kg) today and has had a weight gain of 5 pounds over a period of 2 weeks since her last visit. She has lost 0 lbs since starting treatment with [name].  Vitamin D deficiency Emily [name] has a diagnosis of vitamin D deficiency. She is currently taking vit D and denies nausea, vomiting or muscle weakness.   ALLERGIES: Allergies  Allergen Reactions   Almond Oil Swelling   Apple Swelling   Kiwi Extract Swelling   Soy Allergy Other (See Comments)   Strawberry Extract Other (See Comments)    MEDICATIONS: Current Outpatient Prescriptions on File Prior to Visit  Medication Sig Dispense Refill   albuterol (PROVENTIL HFA;VENTOLIN HFA) 108 (90 Base) MCG/ACT inhaler Inhale 2 puffs into the lungs every 6 (six) hours as needed for wheezing or shortness of breath. 1 Inhaler 0   BORIC ACID EX Apply topically.     clobetasol (TEMOVATE) 0.05 % external solution APPLY TO SCALP 3-4 TIMES PER WEEK AS NEEDED FOR ITCHING AND SCALING. NOT TO FACE. 50 mL 2   Cyanocobalamin (B-12) 2500 MCG SUBL Place 2,500 mcg under the tongue daily. 30 tablet 0   Multiple Vitamin (MULTIVITAMIN) capsule Take 1 capsule by mouth daily.      Naltrexone-Bupropion HCl ER (CONTRAVE) 8-90 MG TB12 Take 2 tablets by mouth 2 (two) times daily. 120 tablet 0   norethindrone-ethinyl estradiol-iron (MICROGESTIN FE 1.5/30) 1.5-30 MG-MCG tablet Take 1 tablet by mouth daily. 3 Package 1   Vitamin D, Ergocalciferol, (DRISDOL) 50000 units CAPS capsule Take 1 capsule (50,000 Units total)  by mouth every 7 (seven) days. 4 capsule 0   No current facility-administered medications on file prior to visit.     PAST MEDICAL HISTORY: Past Medical History:  Diagnosis Date   Allergy    Anemia    B12 deficiency    Back pain    Gallbladder problem    Joint pain    Lactose intolerance    Multiple food allergies    Palpitations    Psoriasis    Swelling    Vitamin D deficiency     PAST SURGICAL HISTORY: Past Surgical History:  Procedure Laterality Date   CHOLECYSTECTOMY  2006   COLPOSCOPY  2008   ENDOMETRIAL BIOPSY  2017   negative per pt   GASTRIC BYPASS  2007    SOCIAL HISTORY: Social History  Substance Use Topics   Smoking status: Never Smoker   Smokeless tobacco: Never Used   Alcohol use Yes     Comment: 2-3 glasses a week    FAMILY HISTORY: Family History  Problem Relation Age of Onset   Hypertension Mother    Obesity Mother    Hypertension Father    Sleep apnea Father    Obesity Father    Alzheimer's disease Maternal Grandmother    Leukemia Maternal Grandfather    Alzheimer's disease Paternal Grandmother    Cancer Paternal Grandfather    Diabetes type II Maternal Aunt    Stroke Maternal Aunt  ROS: Review of Systems  Constitutional: Negative for weight loss.  Gastrointestinal: Negative for nausea and vomiting.  Musculoskeletal:       Negative muscle weakness    PHYSICAL EXAM: Blood pressure 108/67, pulse (!) 55, temperature 98.1 F (36.7 C), temperature source Oral, height 5\' 4"  (1.626 m), weight 208 lb (94.3 kg), SpO2 100 %. Body mass index is 35.7 kg/m. Physical Exam  Constitutional: She is oriented to person, place, and time. She appears well-developed and well-nourished.  Cardiovascular: Normal rate.   Pulmonary/Chest: Effort normal.  Musculoskeletal: Normal range of motion.  Neurological: She is oriented to person, place, and time.  Skin: Skin is warm and dry.  Psychiatric: She has a normal mood  and affect. Her behavior is normal.  Vitals reviewed.   RECENT LABS AND TESTS: BMET    Component Value Date/Time   NA 141 01/26/2017 0820   K 4.0 01/26/2017 0820   CL 101 01/26/2017 0820   CO2 25 01/26/2017 0820   GLUCOSE 88 01/26/2017 0820   BUN 18 01/26/2017 0820   CREATININE 0.82 01/26/2017 0820   CALCIUM 9.1 01/26/2017 0820   GFRNONAA 88 01/26/2017 0820   GFRAA 101 01/26/2017 0820   Lab Results  Component Value Date   HGBA1C 4.9 01/26/2017   HGBA1C 5.0 09/21/2016   Lab Results  Component Value Date   INSULIN 3.5 01/26/2017   INSULIN 2.7 09/21/2016   CBC    Component Value Date/Time   WBC 4.1 01/26/2017 0820   RBC 4.70 01/26/2017 0820   HGB 13.7 01/26/2017 0820   HCT 40.7 01/26/2017 0820   PLT 275 01/26/2017 0820   MCV 87 01/26/2017 0820   MCH 29.1 01/26/2017 0820   MCHC 33.7 01/26/2017 0820   RDW 13.6 01/26/2017 0820   LYMPHSABS 1.6 01/26/2017 0820   EOSABS 0.2 01/26/2017 0820   BASOSABS 0.0 01/26/2017 0820   Iron/TIBC/Ferritin/ %Sat No results found for: IRON, TIBC, FERRITIN, IRONPCTSAT Lipid Panel     Component Value Date/Time   CHOL 190 01/26/2017 0820   TRIG 58 01/26/2017 0820   HDL 116 01/26/2017 0820   LDLCALC 62 01/26/2017 0820   Hepatic Function Panel     Component Value Date/Time   PROT 6.3 01/26/2017 0820   ALBUMIN 3.8 01/26/2017 0820   AST 23 01/26/2017 0820   ALT 23 01/26/2017 0820   ALKPHOS 74 01/26/2017 0820   BILITOT 1.3 (H) 01/26/2017 0820      Component Value Date/Time   TSH 4.560 (H) 01/26/2017 0820   TSH 2.210 09/21/2016 1540    ASSESSMENT AND PLAN: Vitamin D deficiency  Class 2 obesity without serious comorbidity with body mass index (BMI) of 35.0 to 35.9 in adult, unspecified obesity type  PLAN:  Vitamin D Deficiency Emily [name] was informed that low vitamin D levels contributes to fatigue and are associated with obesity, breast, and colon cancer. She agrees to continue to take prescription Vit D @50 ,000 IU every  week and will follow up for routine testing of vitamin D, at least 2-3 times per year. She was informed of the risk of over-replacement of vitamin D and agrees to not increase her dose unless he discusses this with [name] first.  Obesity Emily [name] is currently in the action stage of change. As such, her goal is to continue with weight loss efforts She has agreed to follow the Category 2 plan Torunn has been instructed to work up to a goal of 150 minutes of combined cardio and strengthening exercise per week  for weight loss and overall health benefits. We discussed the following Behavioral Modification Strategies today: increasing lean protein intake and decrease eating out  Tianne has agreed to follow up with our clinic in 2 weeks. She was informed of the importance of frequent follow up visits to maximize her success with intensive lifestyle modifications for her multiple health conditions.  I, Doreene Nest, am acting as scribe for Dennard Nip, MD  I have reviewed the above documentation for accuracy and completeness, and I agree with the above. -Dennard Nip, MD  OBESITY BEHAVIORAL INTERVENTION VISIT  Today's visit was # 11 out of 22.  Starting weight: 205 lbs Starting date: 09/21/17 Today's weight : 208 lbs Today's date: 03/16/2017 Total lbs lost to date: 0 (Patients must lose 7 lbs in the first 6 months to continue with counseling)   ASK: We discussed the diagnosis of obesity with Sabas Sous today and Briani agreed to give [name] permission to discuss obesity behavioral modification therapy today.  ASSESS: Heavenlee has the diagnosis of obesity and her BMI today is 35.8 Royann is in the action stage of change   ADVISE: Magdalen was educated on the multiple health risks of obesity as well as the benefit of weight loss to improve her health. She was advised of the need for long term treatment and the importance of lifestyle modifications.  AGREE: Multiple dietary  modification options and treatment options were discussed and  Torii agreed to follow the Category 2 plan We discussed the following Behavioral Modification Strategies today: increasing lean protein intake and decrease eating out

## 2017-03-29 ENCOUNTER — Ambulatory Visit (INDEPENDENT_AMBULATORY_CARE_PROVIDER_SITE_OTHER): Payer: 59 | Admitting: Family Medicine

## 2017-03-29 VITALS — BP 101/67 | HR 73 | Temp 98.0°F | Ht 64.0 in | Wt 205.0 lb

## 2017-03-29 DIAGNOSIS — Z6835 Body mass index (BMI) 35.0-35.9, adult: Secondary | ICD-10-CM | POA: Diagnosis not present

## 2017-03-29 DIAGNOSIS — E669 Obesity, unspecified: Secondary | ICD-10-CM

## 2017-03-29 DIAGNOSIS — F3289 Other specified depressive episodes: Secondary | ICD-10-CM | POA: Diagnosis not present

## 2017-03-29 NOTE — Progress Notes (Signed)
Office: [phone]  /  Fax: [phone]   HPI:   Chief Complaint: OBESITY Emily [name] is here to discuss her progress with her obesity treatment plan. She is on the  follow the Category 3 plan and is following her eating plan approximately 50 % of the time. She states she is exercising gym 60 minutes 5 times per week. [name] continues to do well with weight loss. She hasn't followed the plan closely but has tried to portion control. She is still 4 pounds higher than her lowest weight with [name]. She is stable on Contrave. [name] is status post weight loss surgery. Her weight is 205 lb (93 kg) today and has had a weight loss of 3 pounds over a period of 2 weeks since her last visit. She has lost 0 lbs since starting treatment with [name].  Depression with emotional eating behaviors [name]'s mood is stable on Contrave. She has no insomnia but is still having more vivid dreams which doesn't bother her. She has decreased emotional eating overall. [name] struggles with emotional eating and using food for comfort to the extent that it is negatively impacting her health. She often snacks when she is not hungry. [name] sometimes feels she is out of control and then feels guilty that she made poor food choices. She has been working on behavior modification techniques to help reduce her emotional eating and has been somewhat successful. She shows no sign of suicidal or homicidal ideations.  Depression screen PHQ 2/9 09/21/2016  Decreased Interest 1  Down, Depressed, Hopeless 1  PHQ - 2 Score 2  Altered sleeping 0  Tired, decreased energy 1  Change in appetite 1  Feeling bad or failure about yourself  2  Trouble concentrating 0  Moving slowly or fidgety/restless 1  Suicidal thoughts 0  PHQ-9 Score 7      ALLERGIES: Allergies  Allergen Reactions  . Almond Oil Swelling  . Apple Swelling  . Kiwi Extract Swelling  . Soy Allergy Other (See Comments)  . Strawberry Extract Other (See  Comments)    MEDICATIONS: Current Outpatient Prescriptions on File Prior to Visit  Medication Sig Dispense Refill  . albuterol (PROVENTIL HFA;VENTOLIN HFA) 108 (90 Base) MCG/ACT inhaler Inhale 2 puffs into the lungs every 6 (six) hours as needed for wheezing or shortness of breath. 1 Inhaler 0  . BORIC ACID EX Apply topically.    . clobetasol (TEMOVATE) 0.05 % external solution APPLY TO SCALP 3-4 TIMES PER WEEK AS NEEDED FOR ITCHING AND SCALING. NOT TO FACE. 50 mL 2  . Cyanocobalamin (B-12) 2500 MCG SUBL Place 2,500 mcg under the tongue daily. 30 tablet 0  . Multiple Vitamin (MULTIVITAMIN) capsule Take 1 capsule by mouth daily.     . Naltrexone-Bupropion HCl ER (CONTRAVE) 8-90 MG TB12 Take 2 tablets by mouth 2 (two) times daily. 120 tablet 0  . norethindrone-ethinyl estradiol-iron (MICROGESTIN FE 1.5/30) 1.5-30 MG-MCG tablet Take 1 tablet by mouth daily. 3 Package 1  . Vitamin D, Ergocalciferol, (DRISDOL) 50000 units CAPS capsule Take 1 capsule (50,000 Units total) by mouth every 7 (seven) days. 4 capsule 0   No current facility-administered medications on file prior to visit.     PAST MEDICAL HISTORY: Past Medical History:  Diagnosis Date  . Allergy   . Anemia   . B12 deficiency   . Back pain   . Gallbladder problem   . Joint pain   . Lactose intolerance   . Multiple food allergies   . Palpitations   .  Psoriasis   . Swelling   . Vitamin D deficiency     PAST SURGICAL HISTORY: Past Surgical History:  Procedure Laterality Date  . CHOLECYSTECTOMY  2006  . COLPOSCOPY  2008  . ENDOMETRIAL BIOPSY  2017   negative per pt  . GASTRIC BYPASS  2007    SOCIAL HISTORY: Social History  Substance Use Topics  . Smoking status: Never Smoker  . Smokeless tobacco: Never Used  . Alcohol use Yes     Comment: 2-3 glasses a week    FAMILY HISTORY: Family History  Problem Relation Age of Onset  . Hypertension Mother   . Obesity Mother   . Hypertension Father   . Sleep apnea  Father   . Obesity Father   . Alzheimer's disease Maternal Grandmother   . Leukemia Maternal Grandfather   . Alzheimer's disease Paternal Grandmother   . Cancer Paternal Grandfather   . Diabetes type II Maternal Aunt   . Stroke Maternal Aunt     ROS: Review of Systems  Constitutional: Positive for weight loss.  Psychiatric/Behavioral: Positive for depression. Negative for suicidal ideas. The patient does not have insomnia.     PHYSICAL EXAM: Blood pressure 101/67, pulse 73, temperature 98 F (36.7 C), temperature source Oral, height 5\' 4"  (1.626 m), weight 205 lb (93 kg), SpO2 99 %. Body mass index is 35.19 kg/m. Physical Exam  Constitutional: She is oriented to person, place, and time. She appears well-developed and well-nourished.  Cardiovascular: Normal rate.   Pulmonary/Chest: Effort normal.  Musculoskeletal: Normal range of motion.  Neurological: She is oriented to person, place, and time.  Skin: Skin is warm and dry.  Psychiatric: She has a normal mood and affect. Her behavior is normal.  Vitals reviewed.   RECENT LABS AND TESTS: BMET    Component Value Date/Time   NA 141 01/26/2017 0820   K 4.0 01/26/2017 0820   CL 101 01/26/2017 0820   CO2 25 01/26/2017 0820   GLUCOSE 88 01/26/2017 0820   BUN 18 01/26/2017 0820   CREATININE 0.82 01/26/2017 0820   CALCIUM 9.1 01/26/2017 0820   GFRNONAA 88 01/26/2017 0820   GFRAA 101 01/26/2017 0820   Lab Results  Component Value Date   HGBA1C 4.9 01/26/2017   HGBA1C 5.0 09/21/2016   Lab Results  Component Value Date   INSULIN 3.5 01/26/2017   INSULIN 2.7 09/21/2016   CBC    Component Value Date/Time   WBC 4.1 01/26/2017 0820   RBC 4.70 01/26/2017 0820   HGB 13.7 01/26/2017 0820   HCT 40.7 01/26/2017 0820   PLT 275 01/26/2017 0820   MCV 87 01/26/2017 0820   MCH 29.1 01/26/2017 0820   MCHC 33.7 01/26/2017 0820   RDW 13.6 01/26/2017 0820   LYMPHSABS 1.6 01/26/2017 0820   EOSABS 0.2 01/26/2017 0820   BASOSABS  0.0 01/26/2017 0820   Iron/TIBC/Ferritin/ %Sat No results found for: IRON, TIBC, FERRITIN, IRONPCTSAT Lipid Panel     Component Value Date/Time   CHOL 190 01/26/2017 0820   TRIG 58 01/26/2017 0820   HDL 116 01/26/2017 0820   LDLCALC 62 01/26/2017 0820   Hepatic Function Panel     Component Value Date/Time   PROT 6.3 01/26/2017 0820   ALBUMIN 3.8 01/26/2017 0820   AST 23 01/26/2017 0820   ALT 23 01/26/2017 0820   ALKPHOS 74 01/26/2017 0820   BILITOT 1.3 (H) 01/26/2017 0820      Component Value Date/Time   TSH 4.560 (H) 01/26/2017 0820  TSH 2.210 09/21/2016 1540    ASSESSMENT AND PLAN: Other depression  Class 2 obesity without serious comorbidity with body mass index (BMI) of 35.0 to 35.9 in adult, unspecified obesity type  PLAN:  Depression with Emotional Eating Behaviors We discussed behavior modification techniques today to help Kaprice deal with her emotional eating and depression. She has agreed to continue contrave as prescribed and dicsuss cognitive behavioral therapy to help stay on track. Buffie agreed to follow up as directed.  We spent > than 50% of the 15 minute visit on the counseling as documented in the note.   Obesity Masayo is currently in the action stage of change. As such, her goal is to continue with weight loss efforts She has agreed to follow the Category 3 plan Tricha has been instructed to work up to a goal of 150 minutes of combined cardio and strengthening exercise per week for weight loss and overall health benefits. We discussed the following Behavioral Modification Strategies today: no skipping meals, meal planning & cooking strategies, planning for success, increasing lean protein intake and decrease eating out  Milagros has agreed to follow up with our clinic in 2 to 3 weeks. She was informed of the importance of frequent follow up visits to maximize her success with intensive lifestyle modifications for her multiple health  conditions.  I, Doreene Nest, am acting as transcriptionist for Dennard Nip, MD  I have reviewed the above documentation for accuracy and completeness, and I agree with the above. -Dennard Nip, MD  OBESITY BEHAVIORAL INTERVENTION VISIT  Today's visit was # 12 out of 22.  Starting weight: 205 lbs Starting date: 09/21/17 Today's weight : 205 lbs Today's date: 03/29/2017 Total lbs lost to date: 0 (Patients must lose 7 lbs in the first 6 months to continue with counseling)   ASK: We discussed the diagnosis of obesity with Sabas Sous today and Lakara agreed to give [name] permission to discuss obesity behavioral modification therapy today.  ASSESS: Bonna has the diagnosis of obesity and her BMI today is 35.3 Lakeidra is in the action stage of change   ADVISE: Coriann was educated on the multiple health risks of obesity as well as the benefit of weight loss to improve her health. She was advised of the need for long term treatment and the importance of lifestyle modifications.  AGREE: Multiple dietary modification options and treatment options were discussed and  Devra agreed to follow the Category 3 plan We discussed the following Behavioral Modification Strategies today: no skipping meals, meal planning & cooking strategies, planning for success, increasing lean protein intake and decrease eating out

## 2017-04-19 ENCOUNTER — Ambulatory Visit (INDEPENDENT_AMBULATORY_CARE_PROVIDER_SITE_OTHER): Payer: 59 | Admitting: Physician Assistant

## 2017-04-19 VITALS — BP 91/57 | HR 53 | Temp 98.1°F | Ht 64.0 in | Wt 207.0 lb

## 2017-04-19 DIAGNOSIS — E669 Obesity, unspecified: Secondary | ICD-10-CM

## 2017-04-19 DIAGNOSIS — F3289 Other specified depressive episodes: Secondary | ICD-10-CM

## 2017-04-19 DIAGNOSIS — Z6835 Body mass index (BMI) 35.0-35.9, adult: Secondary | ICD-10-CM | POA: Diagnosis not present

## 2017-04-19 DIAGNOSIS — E559 Vitamin D deficiency, unspecified: Secondary | ICD-10-CM | POA: Diagnosis not present

## 2017-04-19 MED ORDER — VITAMIN D (ERGOCALCIFEROL) 1.25 MG (50000 UNIT) PO CAPS: 50000.0000 [IU] | ORAL_CAPSULE | ORAL | 0 refills | Status: DC

## 2017-04-19 NOTE — Progress Notes (Signed)
Office: [phone]  /  Fax: [phone]   HPI:   Chief Complaint: OBESITY Emily [name] is here to discuss her progress with her obesity treatment plan. She is on the  follow the Category 3 plan and is following her eating plan approximately 25 % of the time. She states she is exercising at the gym for 60 minutes 5 times per week. Analise has struggled the past 2 weeks with boredom eating and has had increase work Conservation officer, historic buildings. She has not been as motivated and thus has not planned ahead as well.  She does not feel that she is committed to eating healthier all the time and has not been successful at avoiding temptations. States has been skipping doses of Contrave and does not take it regularly. She is ready to get back on track.  Her weight is 207 lb (93.9 kg) today and has had a weight gain of 2 pounds since her last visit. She has not lost weight since starting treatment with [name].   Vitamin D deficiency Tanishia has a diagnosis of vitamin D deficiency. She is currently taking vit D and denies nausea, vomiting or muscle weakness.  Depression with emotional eating behaviors Janellie is struggling with emotional eating and using food for comfort to the extent that it is negatively impacting her health. She often snacks when she is not hungry. Magdalina sometimes feels she is out of control and then feels guilty that she made poor food choices. She has been working on behavior modification techniques to help reduce her emotional eating and has been somewhat successful. She shows no sign of suicidal or homicidal ideations.  Depression screen PHQ 2/9 09/21/2016  Decreased Interest 1  Down, Depressed, Hopeless 1  PHQ - 2 Score 2  Altered sleeping 0  Tired, decreased energy 1  Change in appetite 1  Feeling bad or failure about yourself  2  Trouble concentrating 0  Moving slowly or fidgety/restless 1  Suicidal thoughts 0  PHQ-9 Score 7       ALLERGIES: Allergies  Allergen Reactions  .  Almond Oil Swelling  . Apple Swelling  . Kiwi Extract Swelling  . Soy Allergy Other (See Comments)  . Strawberry Extract Other (See Comments)    MEDICATIONS: Current Outpatient Prescriptions on File Prior to Visit  Medication Sig Dispense Refill  . albuterol (PROVENTIL HFA;VENTOLIN HFA) 108 (90 Base) MCG/ACT inhaler Inhale 2 puffs into the lungs every 6 (six) hours as needed for wheezing or shortness of breath. 1 Inhaler 0  . BORIC ACID EX Apply topically.    . clobetasol (TEMOVATE) 0.05 % external solution APPLY TO SCALP 3-4 TIMES PER WEEK AS NEEDED FOR ITCHING AND SCALING. NOT TO FACE. 50 mL 2  . Cyanocobalamin (B-12) 2500 MCG SUBL Place 2,500 mcg under the tongue daily. 30 tablet 0  . Multiple Vitamin (MULTIVITAMIN) capsule Take 1 capsule by mouth daily.     . Naltrexone-Bupropion HCl ER (CONTRAVE) 8-90 MG TB12 Take 2 tablets by mouth 2 (two) times daily. 120 tablet 0  . norethindrone-ethinyl estradiol-iron (MICROGESTIN FE 1.5/30) 1.5-30 MG-MCG tablet Take 1 tablet by mouth daily. 3 Package 1   No current facility-administered medications on file prior to visit.     PAST MEDICAL HISTORY: Past Medical History:  Diagnosis Date  . Allergy   . Anemia   . B12 deficiency   . Back pain   . Gallbladder problem   . Joint pain   . Lactose intolerance   . Multiple food allergies   .  Palpitations   . Psoriasis   . Swelling   . Vitamin D deficiency     PAST SURGICAL HISTORY: Past Surgical History:  Procedure Laterality Date  . CHOLECYSTECTOMY  2006  . COLPOSCOPY  2008  . ENDOMETRIAL BIOPSY  2017   negative per pt  . GASTRIC BYPASS  2007    SOCIAL HISTORY: Social History  Substance Use Topics  . Smoking status: Never Smoker  . Smokeless tobacco: Never Used  . Alcohol use Yes     Comment: 2-3 glasses a week    FAMILY HISTORY: Family History  Problem Relation Age of Onset  . Hypertension Mother   . Obesity Mother   . Hypertension Father   . Sleep apnea Father   .  Obesity Father   . Alzheimer's disease Maternal Grandmother   . Leukemia Maternal Grandfather   . Alzheimer's disease Paternal Grandmother   . Cancer Paternal Grandfather   . Diabetes type II Maternal Aunt   . Stroke Maternal Aunt     ROS: Review of Systems  Gastrointestinal: Negative for nausea and vomiting.  Musculoskeletal: Negative.        Muscle weakness  Psychiatric/Behavioral: Positive for depression. Negative for suicidal ideas.    PHYSICAL EXAM: Blood pressure (!) 91/57, pulse (!) 53, temperature 98.1 F (36.7 C), temperature source Oral, height 5\' 4"  (1.626 m), weight 207 lb (93.9 kg), SpO2 100 %. Body mass index is 35.53 kg/m. Physical Exam  Constitutional: She is oriented to person, place, and time. She appears well-developed and well-nourished.  Cardiovascular: Normal rate.   Pulmonary/Chest: Effort normal.  Musculoskeletal: Normal range of motion.  Neurological: She is alert and oriented to person, place, and time.  Skin: Skin is warm and dry.  Psychiatric: She has a normal mood and affect.    RECENT LABS AND TESTS: BMET    Component Value Date/Time   NA 141 01/26/2017 0820   K 4.0 01/26/2017 0820   CL 101 01/26/2017 0820   CO2 25 01/26/2017 0820   GLUCOSE 88 01/26/2017 0820   BUN 18 01/26/2017 0820   CREATININE 0.82 01/26/2017 0820   CALCIUM 9.1 01/26/2017 0820   GFRNONAA 88 01/26/2017 0820   GFRAA 101 01/26/2017 0820   Lab Results  Component Value Date   HGBA1C 4.9 01/26/2017   HGBA1C 5.0 09/21/2016   Lab Results  Component Value Date   INSULIN 3.5 01/26/2017   INSULIN 2.7 09/21/2016   CBC    Component Value Date/Time   WBC 4.1 01/26/2017 0820   RBC 4.70 01/26/2017 0820   HGB 13.7 01/26/2017 0820   HCT 40.7 01/26/2017 0820   PLT 275 01/26/2017 0820   MCV 87 01/26/2017 0820   MCH 29.1 01/26/2017 0820   MCHC 33.7 01/26/2017 0820   RDW 13.6 01/26/2017 0820   LYMPHSABS 1.6 01/26/2017 0820   EOSABS 0.2 01/26/2017 0820   BASOSABS 0.0  01/26/2017 0820   Iron/TIBC/Ferritin/ %Sat No results found for: IRON, TIBC, FERRITIN, IRONPCTSAT Lipid Panel     Component Value Date/Time   CHOL 190 01/26/2017 0820   TRIG 58 01/26/2017 0820   HDL 116 01/26/2017 0820   LDLCALC 62 01/26/2017 0820   Hepatic Function Panel     Component Value Date/Time   PROT 6.3 01/26/2017 0820   ALBUMIN 3.8 01/26/2017 0820   AST 23 01/26/2017 0820   ALT 23 01/26/2017 0820   ALKPHOS 74 01/26/2017 0820   BILITOT 1.3 (H) 01/26/2017 0820      Component Value  Date/Time   TSH 4.560 (H) 01/26/2017 0820   TSH 2.210 09/21/2016 1540    ASSESSMENT AND PLAN: Vitamin D deficiency - Plan: Vitamin D, Ergocalciferol, (DRISDOL) 50000 units CAPS capsule  Other depression  Class 2 obesity without serious comorbidity with body mass index (BMI) of 35.0 to 35.9 in adult, unspecified obesity type  PLAN Vitamin D Deficiency Hanin was informed that low vitamin D levels contributes to fatigue and are associated with obesity, breast, and colon cancer. She agrees to continue to take prescription Vit D @50 ,000 IU every week, a refill was written today, and will follow up for routine testing of vitamin D, at least 2-3 times per year. She was informed of the risk of over-replacement of vitamin D and agrees to not increase her dose unless he discusses this with [name] first.  Depression with Emotional Eating Behaviors We discussed behavior modification techniques today to help Aseneth deal with her emotional eating and depression. She has agreed to continue to take Contrave as prescribed and to follow up as directed.  Obesity Helaina is currently in the action stage of change. As such, her goal is to continue with weight loss efforts She has agreed to follow the Category 3 plan Damyia has been instructed to work up to a goal of 150 minutes of combined cardio and strengthening exercise per week for weight loss and overall health benefits. We discussed the following  Behavioral Modification Stratagies today: increasing lean protein intake and ways to avoid boredom eating and keeping healthy foods in the home.   Isidra has agreed to follow up with our clinic in 3 weeks. She was informed of the importance of frequent follow up visits to maximize her success with intensive lifestyle modifications for her multiple health conditions.   Office: [phone]  /  Fax: [phone]  OBESITY BEHAVIORAL INTERVENTION VISIT  Today's visit was # 13 out of 22.  Starting weight: 205 Starting date: 09/21/16 Today's weight : Weight: 207 lb (93.9 kg)  Today's date: 04/22/2017 Total lbs lost to date: Gained 2 pounds (Patients must lose 7 lbs in the first 6 months to continue with counseling)   ASK: We discussed the diagnosis of obesity with Sabas Sous today and Alona agreed to give [name] permission to discuss obesity behavioral modification therapy today.  ASSESS: Demesha has the diagnosis of obesity and her BMI today is 35.6 Marijose is in the action stage of change   ADVISE: Shamaya was educated on the multiple health risks of obesity as well as the benefit of weight loss to improve her health. She was advised of the need for long term treatment and the importance of lifestyle modifications.  AGREE: Multiple dietary modification options and treatment options were discussed and  Jesusa agreed to follow the Category 3 plan We discussed the following Behavioral Modification Stratagies today: increasing lean protein intake and ways to avoid boredom eating and keeping healthy foods in the house.   I have reviewed the above documentation for accuracy and completeness, and I agree with the above. -Lacy Duverney, PA-C  I have reviewed the above note and agree with the plan. -Dennard Nip, MD

## 2017-05-10 ENCOUNTER — Encounter (INDEPENDENT_AMBULATORY_CARE_PROVIDER_SITE_OTHER): Payer: Self-pay

## 2017-05-10 ENCOUNTER — Encounter (INDEPENDENT_AMBULATORY_CARE_PROVIDER_SITE_OTHER): Payer: Self-pay | Admitting: Family Medicine

## 2017-05-10 ENCOUNTER — Ambulatory Visit (INDEPENDENT_AMBULATORY_CARE_PROVIDER_SITE_OTHER): Payer: 59 | Admitting: Physician Assistant

## 2017-05-10 NOTE — Telephone Encounter (Signed)
Can you please reschedule patient?   Thank you very much,  Emily [name]

## 2017-05-25 ENCOUNTER — Encounter: Payer: Self-pay | Admitting: Family

## 2017-05-25 ENCOUNTER — Ambulatory Visit (INDEPENDENT_AMBULATORY_CARE_PROVIDER_SITE_OTHER): Payer: 59 | Admitting: Family

## 2017-05-25 VITALS — BP 108/40 | HR 57 | Temp 98.4°F | Resp 18 | Ht 64.0 in | Wt 213.2 lb

## 2017-05-25 DIAGNOSIS — R229 Localized swelling, mass and lump, unspecified: Secondary | ICD-10-CM | POA: Diagnosis not present

## 2017-05-25 DIAGNOSIS — E039 Hypothyroidism, unspecified: Secondary | ICD-10-CM

## 2017-05-25 DIAGNOSIS — IMO0002 Reserved for concepts with insufficient information to code with codable children: Secondary | ICD-10-CM

## 2017-05-25 LAB — T4, FREE: Free T4: 0.86 ng/dL (ref 0.60–1.60)

## 2017-05-25 LAB — T3, FREE: T3, Free: 3.1 pg/mL (ref 2.3–4.2)

## 2017-05-25 LAB — TSH: TSH: 4.41 u[IU]/mL (ref 0.35–4.50)

## 2017-05-25 NOTE — Progress Notes (Signed)
Subjective:    Patient ID: Emily [name], female    DOB: 05-22-1976, 41 y.o.   MRN: [id number]  HPI  Emily [name] is a 41 yr old female who presents today with several complaints. She reports irritability, fatigue, irregular menses, decreased libido. Trouble losing weight. Symptoms have been going on for several months.  She plans to follow up with GYN.  She also has a mass on her left lateral abdomen "which has been there for years."  Desires removal.  Review of Systems    see HPI  Past Medical History:  Diagnosis Date  . Allergy   . Anemia   . B12 deficiency   . Back pain   . Gallbladder problem   . Joint pain   . Lactose intolerance   . Multiple food allergies   . Palpitations   . Psoriasis   . Swelling   . Vitamin D deficiency      Social History   Social History  . Marital status: Single    Spouse name: N/A  . Number of children: N/A  . Years of education: N/A   Occupational History  . RN [employer]   Social History Main Topics  . Smoking status: Never Smoker  . Smokeless tobacco: Never Used  . Alcohol use Yes     Comment: 2-3 glasses a week  . Drug use: No  . Sexual activity: Yes    Birth control/ protection: Pill   Other Topics Concern  . Not on file   Social History Narrative   Works as a Engineer, maintenance at [employer]   No children   Not married   Has a dog named [place]   From [place]- moved to [place] in 2002   Parents are both living   Has one sister back home   Enjoys going out to eat with friends   Enjoys biking and kayaking.    Goes to the GYM 6 days a week    Past Surgical History:  Procedure Laterality Date  . CHOLECYSTECTOMY  2006  . COLPOSCOPY  2008  . ENDOMETRIAL BIOPSY  2017   negative per pt  . GASTRIC BYPASS  2007    Family History  Problem Relation Age of Onset  . Hypertension Mother   . Obesity Mother   . Hypertension Father   . Sleep apnea Father   . Obesity Father   . Alzheimer's disease Maternal Grandmother    . Leukemia Maternal Grandfather   . Alzheimer's disease Paternal Grandmother   . Cancer Paternal Grandfather   . Diabetes type II Maternal Aunt   . Stroke Maternal Aunt     Allergies  Allergen Reactions  . Almond Oil Swelling  . Apple Swelling  . Kiwi Extract Swelling  . Soy Allergy Other (See Comments)  . Strawberry Extract Other (See Comments)    Current Outpatient Prescriptions on File Prior to Visit  Medication Sig Dispense Refill  . albuterol (PROVENTIL HFA;VENTOLIN HFA) 108 (90 Base) MCG/ACT inhaler Inhale 2 puffs into the lungs every 6 (six) hours as needed for wheezing or shortness of breath. 1 Inhaler 0  . BORIC ACID EX Apply topically.    . clobetasol (TEMOVATE) 0.05 % external solution APPLY TO SCALP 3-4 TIMES PER WEEK AS NEEDED FOR ITCHING AND SCALING. NOT TO FACE. 50 mL 2  . Cyanocobalamin (B-12) 2500 MCG SUBL Place 2,500 mcg under the tongue daily. 30 tablet 0  . Multiple Vitamin (MULTIVITAMIN) capsule Take 1 capsule  by mouth daily.     . norethindrone-ethinyl estradiol-iron (MICROGESTIN FE 1.5/30) 1.5-30 MG-MCG tablet Take 1 tablet by mouth daily. 3 Package 1  . Vitamin D, Ergocalciferol, (DRISDOL) 50000 units CAPS capsule Take 1 capsule (50,000 Units total) by mouth every 7 (seven) days. 4 capsule 0   No current facility-administered medications on file prior to visit.     BP (!) 108/40 (BP Location: Right Arm, Cuff Size: Large)   Pulse (!) 57   Temp 98.4 F (36.9 C) (Oral)   Resp 18   Ht 5\' 4"  (1.626 m)   Wt 213 lb 3.2 oz (96.7 kg)   LMP 05/24/2017   SpO2 100%   BMI 36.60 kg/m    Objective:   Physical Exam  Constitutional: She is oriented to person, place, and time. She appears well-developed and well-nourished.  Cardiovascular: Normal rate, regular rhythm and normal heart sounds.   No murmur heard. Pulmonary/Chest: Effort normal and breath sounds normal. No respiratory distress. She has no wheezes.  Abdominal:    Approximately 1 inch wide firm  subcutaneous mobile mass noted left lateral abdomen  Neurological: She is alert and oriented to person, place, and time.  Psychiatric: She has a normal mood and affect. Her behavior is normal. Judgment and thought content normal.          Assessment & Plan:  Hypothyroid-New.  mild elevation of TSH in the Spring drawn with the weight loss clinic.  Will repeat TSH, as well as check free t3/t4.  We discussed that her symptoms could be related to hypothyroid but could also be related to some mild depression. Will plan to bring her back in 6 weeks for re-evaluation.    Mass- refer to general surgeon for excision.

## 2017-05-25 NOTE — Patient Instructions (Signed)
Please complete lab work prior to leaving.   

## 2017-06-22 ENCOUNTER — Ambulatory Visit: Payer: Self-pay | Admitting: Surgery

## 2017-06-22 DIAGNOSIS — M799 Soft tissue disorder, unspecified: Secondary | ICD-10-CM | POA: Diagnosis not present

## 2017-06-28 ENCOUNTER — Encounter: Payer: Self-pay | Admitting: Obstetrics & Gynecology

## 2017-06-28 ENCOUNTER — Ambulatory Visit (INDEPENDENT_AMBULATORY_CARE_PROVIDER_SITE_OTHER): Payer: 59 | Admitting: Obstetrics & Gynecology

## 2017-06-28 ENCOUNTER — Encounter: Payer: Self-pay | Admitting: Family

## 2017-06-28 VITALS — BP 131/72 | HR 56 | Ht 64.0 in

## 2017-06-28 DIAGNOSIS — N760 Acute vaginitis: Secondary | ICD-10-CM

## 2017-06-28 DIAGNOSIS — N898 Other specified noninflammatory disorders of vagina: Secondary | ICD-10-CM | POA: Diagnosis not present

## 2017-06-28 DIAGNOSIS — B9689 Other specified bacterial agents as the cause of diseases classified elsewhere: Secondary | ICD-10-CM | POA: Diagnosis not present

## 2017-06-28 NOTE — Patient Instructions (Signed)
Bacterial Vaginosis Bacterial vaginosis is a vaginal infection that occurs when the normal balance of bacteria in the vagina is disrupted. It results from an overgrowth of certain bacteria. This is the most common vaginal infection among women ages 15-44. Because bacterial vaginosis increases your risk for STIs (sexually transmitted infections), getting treated can help reduce your risk for chlamydia, gonorrhea, herpes, and HIV (human immunodeficiency virus). Treatment is also important for preventing complications in pregnant women, because this condition can cause an early (premature) delivery. What are the causes? This condition is caused by an increase in harmful bacteria that are normally present in small amounts in the vagina. However, the reason that the condition develops is not fully understood. What increases the risk? The following factors may make you more likely to develop this condition:  Having a new sexual partner or multiple sexual partners.  Having unprotected sex.  Douching.  Having an intrauterine device (IUD).  Smoking.  Drug and alcohol abuse.  Taking certain antibiotic medicines.  Being pregnant.  You cannot get bacterial vaginosis from toilet seats, bedding, swimming pools, or contact with objects around you. What are the signs or symptoms? Symptoms of this condition include:  Grey or white vaginal discharge. The discharge can also be watery or foamy.  A fish-like odor with discharge, especially after sexual intercourse or during menstruation.  Itching in and around the vagina.  Burning or pain with urination.  Some women with bacterial vaginosis have no signs or symptoms. How is this diagnosed? This condition is diagnosed based on:  Your medical history.  A physical exam of the vagina.  Testing a sample of vaginal fluid under a microscope to look for a large amount of bad bacteria or abnormal cells. Your health care provider may use a cotton swab  or a small wooden spatula to collect the sample.  How is this treated? This condition is treated with antibiotics. These may be given as a pill, a vaginal cream, or a medicine that is put into the vagina (suppository). If the condition comes back after treatment, a second round of antibiotics may be needed. Follow these instructions at home: Medicines  Take over-the-counter and prescription medicines only as told by your health care provider.  Take or use your antibiotic as told by your health care provider. Do not stop taking or using the antibiotic even if you start to feel better. General instructions  If you have a female sexual partner, tell her that you have a vaginal infection. She should see her health care provider and be treated if she has symptoms. If you have a female sexual partner, he does not need treatment.  During treatment: ? Avoid sexual activity until you finish treatment. ? Do not douche. ? Avoid alcohol as directed by your health care provider. ? Avoid breastfeeding as directed by your health care provider.  Drink enough water and fluids to keep your urine clear or pale yellow.  Keep the area around your vagina and rectum clean. ? Wash the area daily with warm water. ? Wipe yourself from front to back after using the toilet.  Keep all follow-up visits as told by your health care provider. This is important. How is this prevented?  Do not douche.  Wash the outside of your vagina with warm water only.  Use protection when having sex. This includes latex condoms and dental dams.  Limit how many sexual partners you have. To help prevent bacterial vaginosis, it is best to have sex with just   one partner (monogamous).  Make sure you and your sexual partner are tested for STIs.  Wear cotton or cotton-lined underwear.  Avoid wearing tight pants and pantyhose, especially during summer.  Limit the amount of alcohol that you drink.  Do not use any products that  contain nicotine or tobacco, such as cigarettes and e-cigarettes. If you need help quitting, ask your health care provider.  Do not use illegal drugs. Where to find more information:  Centers for Disease Control and Prevention: www.cdc.gov/std  American Sexual Health Association (ASHA): www.ashastd.org  [place] Department of Health and Human Services, Office on Women's Health: www.womenshealth.gov/ or https://www.womenshealth.gov/a-z-topics/bacterial-vaginosis Contact a health care provider if:  Your symptoms do not improve, even after treatment.  You have more discharge or pain when urinating.  You have a fever.  You have pain in your abdomen.  You have pain during sex.  You have vaginal bleeding between periods. Summary  Bacterial vaginosis is a vaginal infection that occurs when the normal balance of bacteria in the vagina is disrupted.  Because bacterial vaginosis increases your risk for STIs (sexually transmitted infections), getting treated can help reduce your risk for chlamydia, gonorrhea, herpes, and HIV (human immunodeficiency virus). Treatment is also important for preventing complications in pregnant women, because the condition can cause an early (premature) delivery.  This condition is treated with antibiotic medicines. These may be given as a pill, a vaginal cream, or a medicine that is put into the vagina (suppository). This information is not intended to replace advice given to you by your health care provider. Make sure you discuss any questions you have with your health care provider. Document Released: 09/21/2005 Document Revised: 06/06/2016 Document Reviewed: 06/06/2016 Elsevier Interactive Patient Education  2017 [employer]  

## 2017-06-28 NOTE — Progress Notes (Signed)
History:  41 y.o. G0P0000 here today for f/u of recurrent BV. She denies new problems. She reports that her sx are controlled with the Boric acid but, when she stops it the sx return.   Pt denies sx at present.   The following portions of the patient's history were reviewed and updated as appropriate: allergies, current medications, past family history, past medical history, past social history, past surgical history and problem list.  Review of Systems:  Pertinent items are noted in HPI.   Objective:  Physical Exam Blood pressure 131/72, pulse (!) 56, height 5\' 4"  (1.626 m), last menstrual period 05/24/2017.  CONSTITUTIONAL: Well-developed, well-nourished female in no acute distress.  HENT:  Normocephalic, atraumatic EYES: Conjunctivae and EOM are normal. No scleral icterus.  NECK: Normal range of motion SKIN: Skin is warm and dry. No rash noted. Not diaphoretic.No pallor. [place]: Alert and oriented to person, place, and time. Normal coordination.  Pelvic: not done   Assessment & Plan:  Recurrent BV  Keep Boric acid QOD F/u in 6 months or sooner prn F/u wet mount I reviewed with pt the potential methods to decrease sx  Total face-to-face time with patient was 15 min.  Greater than 50% was spent in counseling and coordination of care with the patient.   Therasa Lorenzi L. Harraway-Smith, M.D., Cherlynn June

## 2017-06-29 ENCOUNTER — Other Ambulatory Visit: Payer: Self-pay | Admitting: Obstetrics & Gynecology

## 2017-06-29 DIAGNOSIS — N898 Other specified noninflammatory disorders of vagina: Secondary | ICD-10-CM | POA: Diagnosis not present

## 2017-06-29 DIAGNOSIS — Z7689 Persons encountering health services in other specified circumstances: Secondary | ICD-10-CM | POA: Diagnosis not present

## 2017-07-01 ENCOUNTER — Encounter: Payer: Self-pay | Admitting: Obstetrics & Gynecology

## 2017-07-01 MED ORDER — BUPROPION HCL ER (XL) 150 MG PO TB24: 150.0000 mg | ORAL_TABLET | Freq: Every day | ORAL | 1 refills | Status: DC

## 2017-07-01 MED FILL — buPROPion HCL ER (XL) 150 M: 150 | 30 days supply | Qty: 30 | Fill #0

## 2017-07-06 LAB — VAGINITIS/VAGINOSIS, DNA PROBE
CANDIDA SPECIES: NEGATIVE
GARDNERELLA VAGINALIS: POSITIVE — AB
Trichomonas vaginosis: NEGATIVE

## 2017-07-06 LAB — TEST CODE CHANGE

## 2017-07-07 LAB — CERVICOVAGINAL ANCILLARY ONLY: Wet Prep (BD Affirm): POSITIVE — AB

## 2017-07-20 ENCOUNTER — Encounter: Payer: Self-pay | Admitting: Obstetrics & Gynecology

## 2017-07-22 ENCOUNTER — Telehealth: Payer: Self-pay | Admitting: Obstetrics & Gynecology

## 2017-07-22 DIAGNOSIS — N760 Acute vaginitis: Principal | ICD-10-CM

## 2017-07-22 DIAGNOSIS — B9689 Other specified bacterial agents as the cause of diseases classified elsewhere: Secondary | ICD-10-CM

## 2017-07-22 DIAGNOSIS — Z3041 Encounter for surveillance of contraceptive pills: Secondary | ICD-10-CM

## 2017-07-22 MED ORDER — NORETHIN ACE-ETH ESTRAD-FE 1.5-30 MG-MCG PO TABS: 1.0000 | ORAL_TABLET | Freq: Every day | ORAL | 4 refills | Status: DC

## 2017-07-22 MED ORDER — METRONIDAZOLE 0.75 % VA GEL: 1.0000 | VAGINAL | 6 refills | Status: DC

## 2017-07-22 MED ORDER — SECNIDAZOLE 2 G PO PACK: 2.0000 g | PACK | Freq: Once | ORAL | 0 refills | Status: AC

## 2017-07-22 NOTE — Telephone Encounter (Signed)
Tc to pt. Left message that prescriptions are at the pharmacy.  clh-S

## 2017-07-23 MED FILL — LARIN FE 1.5-30 TABLET: 1.5-30 | 84 days supply | Qty: 84 | Fill #0

## 2017-07-23 MED FILL — metroNIDAZOLE 0.75 % GEL: 0.75 | 20 days supply | Qty: 70 | Fill #0

## 2017-07-29 ENCOUNTER — Telehealth: Payer: Self-pay

## 2017-07-29 MED ORDER — SECNIDAZOLE 2 G PO PACK: 1.0000 | PACK | Freq: Once | ORAL | 0 refills | Status: AC

## 2017-07-29 MED FILL — SOLOSEC 2 GM PACK: 2 | 30 days supply | Qty: 1 | Fill #0

## 2017-07-29 NOTE — Telephone Encounter (Signed)
[facility]. They can order the secnidozole for the patient.  Rx sent in to pharmacy. They will order it and have in tomorrow- will contact patient and let her know.   Left message for patient she can contact pharmacy tomorrow in regards to medication ordered.  Kathrene Alu RNBSN

## 2017-08-03 MED FILL — CLOBETASOL 0.05% SOLUTION: 0.05 | 30 days supply | Qty: 50 | Fill #1

## 2017-08-03 MED FILL — buPROPion HCL ER (XL) 150 M: 150 | 30 days supply | Qty: 30 | Fill #1

## 2017-08-10 ENCOUNTER — Other Ambulatory Visit: Payer: Self-pay

## 2017-08-10 ENCOUNTER — Encounter (HOSPITAL_BASED_OUTPATIENT_CLINIC_OR_DEPARTMENT_OTHER): Payer: Self-pay | Admitting: *Deleted

## 2017-08-13 ENCOUNTER — Ambulatory Visit (INDEPENDENT_AMBULATORY_CARE_PROVIDER_SITE_OTHER): Payer: 59 | Admitting: Family

## 2017-08-13 ENCOUNTER — Encounter: Payer: Self-pay | Admitting: Family

## 2017-08-13 VITALS — BP 118/60 | HR 61 | Temp 98.3°F | Resp 16 | Ht 64.0 in | Wt 223.0 lb

## 2017-08-13 DIAGNOSIS — F3289 Other specified depressive episodes: Secondary | ICD-10-CM | POA: Diagnosis not present

## 2017-08-13 MED ORDER — VITAMIN D3 75 MCG (3000 UT) PO TABS: 1.0000 | ORAL_TABLET | Freq: Every day | ORAL | Status: DC

## 2017-08-13 MED ORDER — BUPROPION HCL ER (XL) 150 MG PO TB24: 150.0000 mg | ORAL_TABLET | Freq: Every day | ORAL | 1 refills | Status: DC

## 2017-08-13 NOTE — Patient Instructions (Signed)
Please start vit D 3000 iu once daily. Continue your work on healthy diet, exercise and weight loss.

## 2017-08-13 NOTE — Progress Notes (Signed)
Subjective:    Patient ID: Emily [name], female    DOB: 16-Jun-1976, 41 y.o.   MRN: [id number]  HPI  [name] is a 41 yr old female who presents today for follow up.  Depression- reports mood is good on wellbutrin.   Wt Readings from Last 3 Encounters:  08/13/17 223 lb (101.2 kg)  05/25/17 213 lb 3.2 oz (96.7 kg)  04/19/17 207 lb (93.9 kg)   Obesity- reports that she is "disgusted" with her weight gain.  Reports that she continues to work out regularly.  Diet is generally healthy but often goes over her calorie count.    Review of Systems See HPI  Past Medical History:  Diagnosis Date  . Allergy   . Anemia   . B12 deficiency   . Back pain   . Depression   . Gallbladder problem   . Joint pain   . Lactose intolerance   . Multiple food allergies   . Palpitations   . Psoriasis   . Soft tissue mass    left flank area  . Swelling   . Vitamin D deficiency      Social History   Socioeconomic History  . Marital status: Single    Spouse name: Not on file  . Number of children: Not on file  . Years of education: Not on file  . Highest education level: Not on file  Social Needs  . Financial resource strain: Not on file  . Food insecurity - worry: Not on file  . Food insecurity - inability: Not on file  . Transportation needs - medical: Not on file  . Transportation needs - non-medical: Not on file  Occupational History  . Occupation: Programmer, multimedia: [employer]  Tobacco Use  . Smoking status: Never Smoker  . Smokeless tobacco: Never Used  Substance and Sexual Activity  . Alcohol use: Yes    Comment: 2-3 glasses a week  . Drug use: No  . Sexual activity: Yes    Birth control/protection: Pill  Other Topics Concern  . Not on file  Social History Narrative   Works as a Engineer, maintenance at [employer]   No children   Not married   Has a dog named [place]   From [place]- moved to [place] in 2002   Parents are both living   Has one sister back home   Enjoys  going out to eat with friends   Enjoys biking and kayaking.    Goes to the GYM 6 days a week    Past Surgical History:  Procedure Laterality Date  . CHOLECYSTECTOMY  2006  . COLPOSCOPY  2008  . ENDOMETRIAL BIOPSY  2017   negative per pt  . GASTRIC BYPASS  2007    Family History  Problem Relation Age of Onset  . Hypertension Mother   . Obesity Mother   . Hypertension Father   . Sleep apnea Father   . Obesity Father   . Alzheimer's disease Maternal Grandmother   . Leukemia Maternal Grandfather   . Alzheimer's disease Paternal Grandmother   . Cancer Paternal Grandfather   . Diabetes type II Maternal Aunt   . Stroke Maternal Aunt     Allergies  Allergen Reactions  . Almond Oil Swelling  . Apple Swelling  . Kiwi Extract Swelling  . Soy Allergy Other (See Comments)  . Strawberry Extract Other (See Comments)    Current Outpatient Medications on File Prior to Visit  Medication Sig Dispense Refill  . albuterol (PROVENTIL HFA;VENTOLIN HFA) 108 (90 Base) MCG/ACT inhaler Inhale 2 puffs into the lungs every 6 (six) hours as needed for wheezing or shortness of breath. 1 Inhaler 0  . clobetasol (TEMOVATE) 0.05 % external solution APPLY TO SCALP 3-4 TIMES PER WEEK AS NEEDED FOR ITCHING AND SCALING. NOT TO FACE. 50 mL 2  . Cyanocobalamin (B-12) 2500 MCG SUBL Place 2,500 mcg under the tongue daily. 30 tablet 0  . metroNIDAZOLE (METROGEL) 0.75 % vaginal gel Place 1 Applicatorful vaginally 3 (three) times a week. Apply one applicatorful to vagina at bedtime 3 times per week. 70 g 6  . Multiple Vitamin (MULTIVITAMIN) capsule Take 1 capsule by mouth daily.     . norethindrone-ethinyl estradiol-iron (MICROGESTIN FE 1.5/30) 1.5-30 MG-MCG tablet Take 1 tablet by mouth daily. 3 Package 4  . BORIC ACID EX Apply topically.     No current facility-administered medications on file prior to visit.     BP 118/60 (BP Location: Left Arm, Cuff Size: Large)   Pulse 61   Temp 98.3 F (36.8 C) (Oral)    Resp 16   Ht 5\' 4"  (1.626 m)   Wt 223 lb (101.2 kg)   SpO2 100%   BMI 38.28 kg/m       Objective:   Physical Exam  Constitutional: She is oriented to person, place, and time. She appears well-developed and well-nourished.  HENT:  Head: Normocephalic and atraumatic.  Cardiovascular: Normal rate, regular rhythm and normal heart sounds.  No murmur heard. Pulmonary/Chest: Effort normal and breath sounds normal. No respiratory distress. She has no wheezes.  Musculoskeletal: She exhibits no edema.  Neurological: She is alert and oriented to person, place, and time.  Psychiatric: She has a normal mood and affect. Her behavior is normal. Judgment and thought content normal.          Assessment & Plan:

## 2017-08-15 DIAGNOSIS — E669 Obesity, unspecified: Secondary | ICD-10-CM | POA: Insufficient documentation

## 2017-08-15 HISTORY — DX: Morbid (severe) obesity due to excess calories: E66.01

## 2017-08-15 NOTE — Assessment & Plan Note (Signed)
Stable on wellbutrin. Continue same.  

## 2017-08-15 NOTE — Assessment & Plan Note (Signed)
Discussed weight loss. Discussed trial of vyvanse.  She will think about this and get back to me if she decides she would like to try.

## 2017-08-16 ENCOUNTER — Encounter (HOSPITAL_BASED_OUTPATIENT_CLINIC_OR_DEPARTMENT_OTHER): Payer: Self-pay | Admitting: Surgery

## 2017-08-16 DIAGNOSIS — M7989 Other specified soft tissue disorders: Secondary | ICD-10-CM | POA: Diagnosis present

## 2017-08-16 NOTE — H&P (Signed)
General Surgery [facility]  Emily [name] DOB: [birth year] Single / Language: [name] / Race: White Female   History of Present Illness   The patient is a 41 year old female who presents with a soft tissue mass.  CC: soft tissue mass left flank  Patient presents on referral from her primary care provider, [name], for evaluation of soft tissue mass on the left flank. Patient states that this is been present for approximately 4-5 years. It has gradually increased in size. She has had intermittent drainage early in the course of this mass but not recently. It has become larger and causes intermittent discomfort. She has had no other such masses evaluated or removed. She presents today to discuss surgical excision for definitive diagnosis and management.   Diagnostic Studies History Colonoscopy  never Mammogram  within last year Pap Smear  1-5 years ago  Allergies  Almond Oil (Bitter) *CHEMICALS*  APPLE  KIWI FRUIT  extract Soy   Medication History  Clobetasol Propionate (0.05% Solution, External) Active. Vitamin D (50000U Tablet, Oral) Active. Boric Acid (External) Active. Cyanocobalamin (2500MCG Tablet, Oral) Active. Multiple Vitamins (Oral) Active. Albuterol (90MCG/ACT Aerosol Soln, Inhalation) Active. Norethindrone-Eth Estradiol (Oral) Specific strength unknown - Active. Medications Reconciled  Social History  Alcohol use  Occasional alcohol use. Caffeine use  Coffee. No drug use  Tobacco use  Never smoker.  Family History  Alcohol Abuse  Family Members In General. Arthritis  Mother. Cancer  Family Members In General. Cerebrovascular Accident  Family Members In General. Hypertension  Father, Mother. Respiratory Condition  Mother.  Pregnancy / Birth History Age at menarche  89 years. Contraceptive History  Oral contraceptives. Gravida  0 Para  0 Regular periods   Other Problems   Cholelithiasis  Heart murmur  Other disease, cancer, significant illness     Review of Systems General Not Present- Appetite Loss, Chills, Fatigue, Fever, Night Sweats, Weight Gain and Weight Loss. Skin Present- Dryness. Not Present- Change in Wart/Mole, Hives, Jaundice, New Lesions, Non-Healing Wounds, Rash and Ulcer. HEENT Not Present- Earache, Hearing Loss, Hoarseness, Nose Bleed, Oral Ulcers, Ringing in the Ears, Seasonal Allergies, Sinus Pain, Sore Throat, Visual Disturbances, Wears glasses/contact lenses and Yellow Eyes. Respiratory Not Present- Bloody sputum, Chronic Cough, Difficulty Breathing, Snoring and Wheezing. Breast Not Present- Breast Mass, Breast Pain, Nipple Discharge and Skin Changes. Cardiovascular Not Present- Chest Pain, Difficulty Breathing Lying Down, Leg Cramps, Palpitations, Rapid Heart Rate, Shortness of Breath and Swelling of Extremities. Gastrointestinal Not Present- Abdominal Pain, Bloating, Bloody Stool, Change in Bowel Habits, Chronic diarrhea, Constipation, Difficulty Swallowing, Excessive gas, Gets full quickly at meals, Hemorrhoids, Indigestion, Nausea, Rectal Pain and Vomiting. Female Genitourinary Not Present- Frequency, Nocturia, Painful Urination, Pelvic Pain and Urgency. Musculoskeletal Not Present- Back Pain, Joint Pain, Joint Stiffness, Muscle Pain, Muscle Weakness and Swelling of Extremities. Neurological Not Present- Decreased Memory, Fainting, Headaches, Numbness, Seizures, Tingling, Tremor, Trouble walking and Weakness. Psychiatric Not Present- Anxiety, Bipolar, Change in Sleep Pattern, Depression, Fearful and Frequent crying. Endocrine Not Present- Cold Intolerance, Excessive Hunger, Hair Changes, Heat Intolerance, Hot flashes and New Diabetes. Hematology Not Present- Blood Thinners, Easy Bruising, Excessive bleeding, Gland problems, HIV and Persistent Infections.  Vitals Weight: 216 lb Height: 64in Body Surface Area: 2.02 m Body Mass  Index: 37.08 kg/m  Temp.: 98.26F  Pulse: 63 (Regular)  BP: 130/76 (Sitting, Left Arm, Standard)  Physical Exam  See vital signs recorded above  GENERAL APPEARANCE Development: normal Nutritional status: normal Gross deformities: none  SKIN Rash, lesions, ulcers: none Induration, erythema: none Nodules: none palpable  EYES Conjunctiva and lids: normal Pupils: equal and reactive Iris: normal bilaterally  EARS, NOSE, MOUTH, THROAT External ears: no lesion or deformity External nose: no lesion or deformity Hearing: grossly normal Lips: no lesion or deformity Dentition: normal for age Oral mucosa: moist  NECK Symmetric: yes Trachea: midline Thyroid: no palpable nodules in the thyroid bed  CHEST Respiratory effort: normal Retraction or accessory muscle use: no Breath sounds: normal bilaterally Rales, rhonchi, wheeze: none  CARDIOVASCULAR Auscultation: regular rhythm, normal rate Murmurs: none Pulses: carotid and radial pulse 2+ palpable Lower extremity edema: none Lower extremity varicosities: none  ABDOMEN On the left lateral abdominal wall is a soft tissue mass located approximately halfway between the costal margin and the iliac crest which measures at least 4 cm in greatest dimension on palpation. Mass is relatively firm and mildly tender. There are no overlying cutaneous changes. There is no sign of infection. There is no fluctuance.  MUSCULOSKELETAL Station and gait: normal Digits and nails: no clubbing or cyanosis Muscle strength: grossly normal all extremities Range of motion: grossly normal all extremities Deformity: none  LYMPHATIC Cervical: none palpable Supraclavicular: none palpable  PSYCHIATRIC Oriented to person, place, and time: yes Mood and affect: normal for situation Judgment and insight: appropriate for situation    Assessment & Plan   SOFT TISSUE MASS (M79.9)  Patient presents with an enlarging soft tissue mass on the  left flank. This is been present for 4-5 years and gradually increasing in size. Early in the course of the lesion she experienced drainage. She has had intermittent mild tenderness. She desires surgical excision for definitive diagnosis and management.  Patient discussed the location of the surgical incision and size of the surgical incision. We discussed potential complications such as seroma formation or infection. We will submit the specimen to pathology for review. Patient would like to proceed with outpatient surgery at some point in the near future.  The risks and benefits of the procedure have been discussed at length with the patient. The patient understands the proposed procedure, potential alternative treatments, and the course of recovery to be expected. All of the patient's questions have been answered at this time. The patient wishes to proceed with surgery.  [name], [place] [facility] Office: [phone]

## 2017-08-17 ENCOUNTER — Ambulatory Visit (HOSPITAL_BASED_OUTPATIENT_CLINIC_OR_DEPARTMENT_OTHER): Payer: 59 | Admitting: Anesthesiology

## 2017-08-17 ENCOUNTER — Ambulatory Visit (HOSPITAL_BASED_OUTPATIENT_CLINIC_OR_DEPARTMENT_OTHER)
Admission: RE | Admit: 2017-08-17 | Discharge: 2017-08-17 | Disposition: A | Discharge status: Home / Self Care | Payer: 59 | Source: Ambulatory Visit | Attending: Surgery | Admitting: Surgery

## 2017-08-17 ENCOUNTER — Encounter (HOSPITAL_BASED_OUTPATIENT_CLINIC_OR_DEPARTMENT_OTHER)
Admission: RE | Disposition: A | Discharge status: Home / Self Care | Payer: Self-pay | Source: Ambulatory Visit | Attending: Surgery

## 2017-08-17 ENCOUNTER — Encounter (HOSPITAL_BASED_OUTPATIENT_CLINIC_OR_DEPARTMENT_OTHER): Payer: Self-pay | Admitting: *Deleted

## 2017-08-17 ENCOUNTER — Other Ambulatory Visit: Payer: Self-pay

## 2017-08-17 DIAGNOSIS — Z6837 Body mass index (BMI) 37.0-37.9, adult: Secondary | ICD-10-CM | POA: Diagnosis not present

## 2017-08-17 DIAGNOSIS — E559 Vitamin D deficiency, unspecified: Secondary | ICD-10-CM | POA: Diagnosis not present

## 2017-08-17 DIAGNOSIS — N761 Subacute and chronic vaginitis: Secondary | ICD-10-CM | POA: Diagnosis not present

## 2017-08-17 DIAGNOSIS — M7989 Other specified soft tissue disorders: Secondary | ICD-10-CM

## 2017-08-17 DIAGNOSIS — R222 Localized swelling, mass and lump, trunk: Secondary | ICD-10-CM | POA: Diagnosis not present

## 2017-08-17 DIAGNOSIS — L720 Epidermal cyst: Secondary | ICD-10-CM | POA: Diagnosis not present

## 2017-08-17 DIAGNOSIS — Z79899 Other long term (current) drug therapy: Secondary | ICD-10-CM | POA: Diagnosis not present

## 2017-08-17 DIAGNOSIS — L723 Sebaceous cyst: Secondary | ICD-10-CM | POA: Diagnosis not present

## 2017-08-17 DIAGNOSIS — R011 Cardiac murmur, unspecified: Secondary | ICD-10-CM | POA: Diagnosis not present

## 2017-08-17 DIAGNOSIS — F329 Major depressive disorder, single episode, unspecified: Secondary | ICD-10-CM | POA: Diagnosis not present

## 2017-08-17 HISTORY — DX: Other specified soft tissue disorders: M79.89

## 2017-08-17 HISTORY — PX: MASS EXCISION: SHX2000

## 2017-08-17 HISTORY — DX: Major depressive disorder, single episode, unspecified: F32.9

## 2017-08-17 HISTORY — DX: Depression, unspecified: F32.A

## 2017-08-17 SURGERY — EXCISION MASS
Anesthesia: Monitor Anesthesia Care | Site: Flank | Laterality: Left

## 2017-08-17 MED ORDER — BUPIVACAINE HCL (PF) 0.5 % IJ SOLN
INTRAMUSCULAR | Status: DC | PRN
Start: 2017-08-17 — End: 2017-08-17
  Administered 2017-08-17: 10 mL

## 2017-08-17 MED ORDER — MEPERIDINE HCL 25 MG/ML IJ SOLN: 6.2500 mg | INTRAMUSCULAR | Status: DC | PRN

## 2017-08-17 MED ORDER — CEFAZOLIN SODIUM-DEXTROSE 2-4 GM/100ML-% IV SOLN
2.0000 g | INTRAVENOUS | Status: AC
  Administered 2017-08-17: 2 g via INTRAVENOUS

## 2017-08-17 MED ORDER — FENTANYL CITRATE (PF) 100 MCG/2ML IJ SOLN: 25.0000 ug | INTRAMUSCULAR | Status: DC | PRN

## 2017-08-17 MED ORDER — PROPOFOL 10 MG/ML IV BOLUS
INTRAVENOUS | Status: DC | PRN
  Administered 2017-08-17 (×7): 20 mg via INTRAVENOUS
  Administered 2017-08-17: 40 mg via INTRAVENOUS
  Administered 2017-08-17: 20 mg via INTRAVENOUS

## 2017-08-17 MED ORDER — BUPIVACAINE-EPINEPHRINE (PF) 0.5% -1:200000 IJ SOLN
INTRAMUSCULAR | Status: AC
  Filled 2017-08-17: qty 30

## 2017-08-17 MED ORDER — OXYCODONE HCL 5 MG PO TABS: 5.0000 mg | ORAL_TABLET | Freq: Once | ORAL | Status: DC | PRN

## 2017-08-17 MED ORDER — SCOPOLAMINE 1 MG/3DAYS TD PT72: 1.0000 | MEDICATED_PATCH | Freq: Once | TRANSDERMAL | Status: DC | PRN

## 2017-08-17 MED ORDER — CHLORHEXIDINE GLUCONATE CLOTH 2 % EX PADS: 6.0000 | MEDICATED_PAD | Freq: Once | CUTANEOUS | Status: DC

## 2017-08-17 MED ORDER — CEFAZOLIN SODIUM-DEXTROSE 2-4 GM/100ML-% IV SOLN
INTRAVENOUS | Status: AC
  Filled 2017-08-17: qty 100

## 2017-08-17 MED ORDER — TRAMADOL HCL 50 MG PO TABS: 50.0000 mg | ORAL_TABLET | Freq: Four times a day (QID) | ORAL | 0 refills | Status: DC | PRN

## 2017-08-17 MED ORDER — PROMETHAZINE HCL 25 MG/ML IJ SOLN: 6.2500 mg | INTRAMUSCULAR | Status: DC | PRN

## 2017-08-17 MED ORDER — MIDAZOLAM HCL 2 MG/2ML IJ SOLN
1.0000 mg | INTRAMUSCULAR | Status: DC | PRN
  Administered 2017-08-17: 2 mg via INTRAVENOUS

## 2017-08-17 MED ORDER — FENTANYL CITRATE (PF) 100 MCG/2ML IJ SOLN
50.0000 ug | INTRAMUSCULAR | Status: DC | PRN
  Administered 2017-08-17: 100 ug via INTRAVENOUS

## 2017-08-17 MED ORDER — MIDAZOLAM HCL 2 MG/2ML IJ SOLN
INTRAMUSCULAR | Status: AC
  Filled 2017-08-17: qty 2

## 2017-08-17 MED ORDER — LACTATED RINGERS IV SOLN
INTRAVENOUS | Status: DC
  Administered 2017-08-17: 10:00:00 via INTRAVENOUS

## 2017-08-17 MED ORDER — LIDOCAINE 2% (20 MG/ML) 5 ML SYRINGE
INTRAMUSCULAR | Status: DC | PRN
  Administered 2017-08-17: 50 mg via INTRAVENOUS

## 2017-08-17 MED ORDER — FENTANYL CITRATE (PF) 100 MCG/2ML IJ SOLN
INTRAMUSCULAR | Status: AC
  Filled 2017-08-17: qty 2

## 2017-08-17 MED ORDER — BUPIVACAINE HCL (PF) 0.5 % IJ SOLN
INTRAMUSCULAR | Status: AC
  Filled 2017-08-17: qty 30

## 2017-08-17 MED ORDER — ONDANSETRON HCL 4 MG/2ML IJ SOLN
INTRAMUSCULAR | Status: DC | PRN
  Administered 2017-08-17: 4 mg via INTRAVENOUS

## 2017-08-17 MED ORDER — LIDOCAINE 2% (20 MG/ML) 5 ML SYRINGE
INTRAMUSCULAR | Status: AC
  Filled 2017-08-17: qty 5

## 2017-08-17 MED ORDER — OXYCODONE HCL 5 MG/5ML PO SOLN: 5.0000 mg | Freq: Once | ORAL | Status: DC | PRN

## 2017-08-17 MED ORDER — PROPOFOL 10 MG/ML IV BOLUS
INTRAVENOUS | Status: AC
  Filled 2017-08-17: qty 20

## 2017-08-17 SURGICAL SUPPLY — 42 items
BENZOIN TINCTURE PRP APPL 2/3 (GAUZE/BANDAGES/DRESSINGS) IMPLANT
BLADE SURG 15 STRL LF DISP TIS (BLADE) ×1 IMPLANT
BLADE SURG 15 STRL SS (BLADE) ×1
CHLORAPREP W/TINT 26ML (MISCELLANEOUS) ×2 IMPLANT
CLEANER CAUTERY TIP 5X5 PAD (MISCELLANEOUS) IMPLANT
COVER BACK TABLE 60X90IN (DRAPES) ×2 IMPLANT
COVER MAYO STAND STRL (DRAPES) ×2 IMPLANT
DECANTER SPIKE VIAL GLASS SM (MISCELLANEOUS) IMPLANT
DRAPE LAPAROTOMY 100X72 PEDS (DRAPES) ×2 IMPLANT
DRAPE U-SHAPE 76X120 STRL (DRAPES) IMPLANT
DRAPE UTILITY XL STRL (DRAPES) ×2 IMPLANT
DRSG TEGADERM 4X4.75 (GAUZE/BANDAGES/DRESSINGS) IMPLANT
ELECT REM PT RETURN 9FT ADLT (ELECTROSURGICAL) ×2
ELECTRODE REM PT RTRN 9FT ADLT (ELECTROSURGICAL) ×1 IMPLANT
GAUZE SPONGE 4X4 12PLY STRL LF (GAUZE/BANDAGES/DRESSINGS) ×2 IMPLANT
GLOVE BIOGEL PI IND STRL 6.5 (GLOVE) ×1 IMPLANT
GLOVE BIOGEL PI IND STRL 7.0 (GLOVE) ×1 IMPLANT
GLOVE BIOGEL PI INDICATOR 6.5 (GLOVE) ×1
GLOVE BIOGEL PI INDICATOR 7.0 (GLOVE) ×1
GLOVE ECLIPSE 6.5 STRL STRAW (GLOVE) ×2 IMPLANT
GLOVE SURG ORTHO 8.0 STRL STRW (GLOVE) ×2 IMPLANT
GOWN STRL REUS W/ TWL LRG LVL3 (GOWN DISPOSABLE) ×1 IMPLANT
GOWN STRL REUS W/ TWL XL LVL3 (GOWN DISPOSABLE) ×1 IMPLANT
GOWN STRL REUS W/TWL LRG LVL3 (GOWN DISPOSABLE) ×1
GOWN STRL REUS W/TWL XL LVL3 (GOWN DISPOSABLE) ×1
NEEDLE HYPO 25X1 1.5 SAFETY (NEEDLE) ×2 IMPLANT
PACK BASIN DAY SURGERY FS (CUSTOM PROCEDURE TRAY) ×2 IMPLANT
PAD CLEANER CAUTERY TIP 5X5 (MISCELLANEOUS)
PENCIL BUTTON HOLSTER BLD 10FT (ELECTRODE) ×2 IMPLANT
SHEET MEDIUM DRAPE 40X70 STRL (DRAPES) IMPLANT
SLEEVE SCD COMPRESS KNEE MED (MISCELLANEOUS) IMPLANT
STRIP CLOSURE SKIN 1/2X4 (GAUZE/BANDAGES/DRESSINGS) ×2 IMPLANT
SUT ETHILON 3 0 PS 1 (SUTURE) IMPLANT
SUT ETHILON 4 0 PS 2 18 (SUTURE) IMPLANT
SUT MNCRL AB 4-0 PS2 18 (SUTURE) ×2 IMPLANT
SUT VIC AB 3-0 SH 27 (SUTURE) ×1
SUT VIC AB 3-0 SH 27X BRD (SUTURE) ×1 IMPLANT
SUT VICRYL 3-0 CR8 SH (SUTURE) IMPLANT
SUT VICRYL 4-0 PS2 18IN ABS (SUTURE) IMPLANT
SYR CONTROL 10ML LL (SYRINGE) ×2 IMPLANT
TOWEL OR 17X24 6PK STRL BLUE (TOWEL DISPOSABLE) ×2 IMPLANT
TOWEL OR NON WOVEN STRL DISP B (DISPOSABLE) ×2 IMPLANT

## 2017-08-17 NOTE — Brief Op Note (Signed)
08/17/2017  11:25 AM  PATIENT:  Emily [name]  41 y.o. female  PRE-OPERATIVE DIAGNOSIS:  soft tissue mass left flank  POST-OPERATIVE DIAGNOSIS:  same  PROCEDURE:  Procedure(s): EXCISION SOFT TISSUE  MASS LEFT FLANK (Left)  SURGEON:  Surgeon(s) and Role:    [name], MD - Primary  ANESTHESIA:   IV sedation  EBL: minimal  BLOOD ADMINISTERED:none  DRAINS: none   LOCAL MEDICATIONS USED:  MARCAINE     SPECIMEN:  Excision  DISPOSITION OF SPECIMEN:  PATHOLOGY  COUNTS:  YES  TOURNIQUET:  * No tourniquets in log *  DICTATION: .Other Dictation: Dictation Number [id number]  PLAN OF CARE: Discharge to home after PACU  PATIENT DISPOSITION:  PACU - hemodynamically stable.   Delay start of Pharmacological VTE agent (>24hrs) due to surgical blood loss or risk of bleeding: yes  [name], MD [facility] Office: [phone]

## 2017-08-17 NOTE — Anesthesia Preprocedure Evaluation (Signed)
Anesthesia Evaluation  Patient identified by MRN, date of birth, ID band Patient awake    Reviewed: Allergy & Precautions, NPO status , Patient's Chart, lab work & pertinent test results  Airway Mallampati: II  TM Distance: <3 FB Neck ROM: Full    Dental no notable dental hx.    Pulmonary neg pulmonary ROS,    Pulmonary exam normal breath sounds clear to auscultation       Cardiovascular negative cardio ROS Normal cardiovascular exam Rhythm:Regular Rate:Normal     Neuro/Psych negative neurological ROS  negative psych ROS   GI/Hepatic negative GI ROS, Neg liver ROS,   Endo/Other  Morbid obesity  Renal/GU negative Renal ROS  negative genitourinary   Musculoskeletal negative musculoskeletal ROS (+)   Abdominal   Peds negative pediatric ROS (+)  Hematology negative hematology ROS (+)   Anesthesia Other Findings   Reproductive/Obstetrics negative OB ROS                             Anesthesia Physical Anesthesia Plan  ASA: II  Anesthesia Plan: MAC   Post-op Pain Management:    Induction: Intravenous  PONV Risk Score and Plan: 2 and Ondansetron  Airway Management Planned: Simple Face Mask  Additional Equipment:   Intra-op Plan:   Post-operative Plan:   Informed Consent: I have reviewed the patients History and Physical, chart, labs and discussed the procedure including the risks, benefits and alternatives for the proposed anesthesia with the patient or authorized representative who has indicated his/her understanding and acceptance.   Dental advisory given  Plan Discussed with: CRNA and Surgeon  Anesthesia Plan Comments:         Anesthesia Quick Evaluation

## 2017-08-17 NOTE — Op Note (Signed)
[name], [name] NO.:  [id number]  MEDICAL RECORD NO.:  [id number]  LOCATION:                                 FACILITY:  PHYSICIAN:  Earnstine Regal, MD           DATE OF BIRTH:  DATE OF PROCEDURE:  08/17/2017                              OPERATIVE REPORT   LOCATION:  [place].  PREOPERATIVE DIAGNOSIS:  Soft tissue mass, left flank.  POSTOPERATIVE DIAGNOSIS:  Soft tissue mass, left flank.  PROCEDURE:  Excision soft tissue mass, left flank, skin and subcutaneous tissue (3.5 x 3.0 x 2.5 cm).  SURGEON:  Earnstine Regal, MD.  ANESTHESIA:  Local with intravenous sedation.  PREPARATION:  ChloraPrep.  COMPLICATIONS:  None.  BLOOD LOSS:  Minimal.  INDICATIONS:  The patient is a 40 year old white female with an enlarging soft tissue mass in the left flank.  She now comes to Surgery for excision.  BODY OF REPORT:  Procedure was done in OR #8 at the [facility].  The patient was brought to the operating room, placed in a right lateral decubitus position on the operating room table.  Following administration of intravenous sedation, the patient was prepped and draped in the usual aseptic fashion.  After ascertaining that an adequate level of sedation had been achieved, the skin overlying the mass was anesthetized with local anesthetic.  An elliptical incision was made with a #15 blade so as to remove the overlying skin with the underlying mass.  Dissection was carried through subcutaneous tissues using the electrocautery for hemostasis.  The entire mass was excised and submitted in toto to Pathology for review.  Good hemostasis was obtained throughout the wound.  Subcutaneous tissues were closed with interrupted 3-0 Vicryl sutures.  Skin was closed with a running 4-0 Monocryl subcuticular suture.  Wound was washed and dried, and Steri-Strips were applied.  Sterile dressing was applied.  The patient was awakened from anesthesia  and brought to the recovery room. The patient tolerated the procedure well.    [name], [place] [facility] Office: [phone]    TMG/MEDQ  D:  08/17/2017  T:  08/17/2017  Job:  [id number]

## 2017-08-17 NOTE — Transfer of Care (Signed)
Immediate Anesthesia Transfer of Care Note  Patient: Emily [name]  Procedure(s) Performed: EXCISION SOFT TISSUE  MASS LEFT FLANK (Left Flank)  Patient Location: PACU  Anesthesia Type:MAC  Level of Consciousness: awake, alert  and oriented  Airway & Oxygen Therapy: Patient Spontanous Breathing  Post-op Assessment: Report given to RN and Post -op Vital signs reviewed and stable  Post vital signs: Reviewed and stable  Last Vitals:  Vitals:   08/17/17 0934  BP: 119/68  Pulse: (!) 51  Resp: 18  Temp: 36.8 C  SpO2: 99%    Last Pain: There were no vitals filed for this visit.    Patients Stated Pain Goal: 0 (43/27/61 4709)  Complications: No apparent anesthesia complications

## 2017-08-17 NOTE — Discharge Instructions (Signed)
°  [facility] -- DISCHARGE INSTRUCTIONS  REMINDER:   Carry a list of your medications and allergies with you at all times  Call your pharmacy at least 1 week in advance to refill prescriptions  Do not mix any prescribed pain medicine with alcohol  Do not drive any motor vehicles while taking pain medication  Take medications with food unless otherwise directed  Follow-up appointments (date to return to physician): Please call [phone] to confirm your follow up appointment with your surgeon.  Call your Surgeon if you have:  Temperature greater than 101.0  Persistent nausea and vomiting  Severe uncontrolled pain  Redness, tenderness, or signs of infection (pain, swelling, redness, odor or    green/yellow discharge around the site)  Difficulty breathing, headache or visual disturbances  Hives  Persistent dizziness or light-headedness  Any other questions or concerns you may have after discharge  In an emergency, call 911 or go to an Emergency Department at a nearby hospital.  Diet: Begin with liquids, and if they are tolerated, resume your usual diet.  Avoid spicy, greasy or heavy foods.  If you have nausea or vomiting, go back to liquids.  If you cannot keep liquids down, call your doctor.  Avoid alcohol consumption while on prescription pain medications. Good nutrition promotes healing. Increase fiber and fluids.   ADDITIONAL INSTRUCTIONS:   Earnstine Regal, MD, [facility] Office: [place] Instructions  Activity: Get plenty of rest for the remainder of the day. A responsible individual must stay with you for 24 hours following the procedure.  For the next 24 hours, DO NOT: -Drive a car -Paediatric nurse -Drink alcoholic beverages -Take any medication unless instructed by your physician -Make any legal decisions or sign important papers.  Meals: Start with liquid foods such as gelatin or soup.  Progress to regular foods as tolerated. Avoid greasy, spicy, heavy foods. If nausea and/or vomiting occur, drink only clear liquids until the nausea and/or vomiting subsides. Call your physician if vomiting continues.  Special Instructions/Symptoms: Your throat may feel dry or sore from the anesthesia or the breathing tube placed in your throat during surgery. If this causes discomfort, gargle with warm salt water. The discomfort should disappear within 24 hours.  If you had a scopolamine patch placed behind your ear for the management of post- operative nausea and/or vomiting:  1. The medication in the patch is effective for 72 hours, after which it should be removed.  Wrap patch in a tissue and discard in the trash. Wash hands thoroughly with soap and water. 2. You may remove the patch earlier than 72 hours if you experience unpleasant side effects which may include dry mouth, dizziness or visual disturbances. 3. Avoid touching the patch. Wash your hands with soap and water after contact with the patch.

## 2017-08-17 NOTE — Interval H&P Note (Signed)
History and Physical Interval Note:  08/17/2017 10:16 AM  Emily [name]  has presented today for surgery, with the diagnosis of soft tissue mass.  The various methods of treatment have been discussed with the patient and family. After consideration of risks, benefits and other options for treatment, the patient has consented to    Procedure(s): EXCISION SOFT TISSUE  MASS LEFT FLANK (Left) as a surgical intervention .    The patient's history has been reviewed, patient examined, no change in status, stable for surgery.  I have reviewed the patient's chart and labs.  Questions were answered to the patient's satisfaction.    [name], [facility] Office: [place]

## 2017-08-18 ENCOUNTER — Encounter (HOSPITAL_BASED_OUTPATIENT_CLINIC_OR_DEPARTMENT_OTHER): Payer: Self-pay | Admitting: Surgery

## 2017-08-18 NOTE — Anesthesia Postprocedure Evaluation (Signed)
Anesthesia Post Note  Patient: Emily [name]  Procedure(s) Performed: EXCISION SOFT TISSUE  MASS LEFT FLANK (Left Flank)     Patient location during evaluation: PACU Anesthesia Type: General Level of consciousness: awake and alert Pain management: pain level controlled Vital Signs Assessment: post-procedure vital signs reviewed and stable Respiratory status: spontaneous breathing, nonlabored ventilation, respiratory function stable and patient connected to nasal cannula oxygen Cardiovascular status: blood pressure returned to baseline and stable Postop Assessment: no apparent nausea or vomiting Anesthetic complications: no    Last Vitals:  Vitals:   08/17/17 1231 08/17/17 1234  BP: (!) 124/51   Pulse: (!) 42 (!) 50  Resp: 18   Temp: (!) 36.3 C   SpO2: 97%     Last Pain:  Vitals:   08/17/17 1231  TempSrc: Oral  PainSc: 1                  Jacquel Redditt S

## 2017-08-18 NOTE — Progress Notes (Signed)
Please contact patient and notify of benign pathology results.  Emmi Wertheim M. Jaze Rodino, MD, FACS [facility] Office: [phone]   

## 2017-08-23 DIAGNOSIS — D3132 Benign neoplasm of left choroid: Secondary | ICD-10-CM | POA: Diagnosis not present

## 2017-08-23 DIAGNOSIS — H524 Presbyopia: Secondary | ICD-10-CM | POA: Diagnosis not present

## 2017-08-23 DIAGNOSIS — H11001 Unspecified pterygium of right eye: Secondary | ICD-10-CM | POA: Diagnosis not present

## 2017-08-23 DIAGNOSIS — H18453 Nodular corneal degeneration, bilateral: Secondary | ICD-10-CM | POA: Diagnosis not present

## 2017-08-23 DIAGNOSIS — H25013 Cortical age-related cataract, bilateral: Secondary | ICD-10-CM | POA: Diagnosis not present

## 2017-08-25 ENCOUNTER — Telehealth: Payer: Self-pay | Admitting: *Deleted

## 2017-08-25 NOTE — Telephone Encounter (Signed)
Received result report from [facility]; forwarded to provider/SLS 11/21

## 2017-09-08 ENCOUNTER — Telehealth: Payer: Self-pay | Admitting: Physician Assistant

## 2017-09-08 ENCOUNTER — Ambulatory Visit (INDEPENDENT_AMBULATORY_CARE_PROVIDER_SITE_OTHER): Payer: 59 | Admitting: *Deleted

## 2017-09-08 DIAGNOSIS — R82998 Other abnormal findings in urine: Secondary | ICD-10-CM | POA: Diagnosis not present

## 2017-09-08 DIAGNOSIS — R350 Frequency of micturition: Secondary | ICD-10-CM | POA: Diagnosis not present

## 2017-09-08 DIAGNOSIS — R300 Dysuria: Secondary | ICD-10-CM

## 2017-09-08 LAB — POCT URINALYSIS DIPSTICK
BILIRUBIN UA: NEGATIVE
GLUCOSE UA: NEGATIVE
Ketones, UA: NEGATIVE
Nitrite, UA: NEGATIVE
SPEC GRAV UA: 1.02 (ref 1.010–1.025)
Urobilinogen, UA: 0.2 E.U./dL
pH, UA: 6 (ref 5.0–8.0)

## 2017-09-08 MED ORDER — CEPHALEXIN 500 MG PO CAPS: 500.0000 mg | ORAL_CAPSULE | Freq: Two times a day (BID) | ORAL | 0 refills | Status: AC

## 2017-09-08 MED FILL — CEPHALEXIN 500 MG CAPSULE: 500 | 7 days supply | Qty: 14 | Fill #0

## 2017-09-08 MED FILL — metroNIDAZOLE 0.75 % GEL: 0.75 | 20 days supply | Qty: 70 | Fill #1

## 2017-09-08 MED FILL — BUPROPION HCL XL 150 MG TAB: 150 | 90 days supply | Qty: 90 | Fill #0

## 2017-09-08 MED FILL — DESONIDE 0.05% CREAM: 0.05 | 30 days supply | Qty: 30 | Fill #1

## 2017-09-08 NOTE — Telephone Encounter (Signed)
Patient working in office today. Has had 6 days of urinary urgency, frequency and dysuria without fever, chills, n/v or flank pain. Has history of UTI. Denies other concerns. Urine dip obtained by CMA and + blood and large LE. Will obtain culture. Will start empiric Keflex until culture results. Supportive measures and OTC medications reviewed.

## 2017-09-11 LAB — URINE CULTURE
MICRO NUMBER:: 81370313
SPECIMEN QUALITY:: ADEQUATE

## 2017-09-12 ENCOUNTER — Encounter: Payer: Self-pay | Admitting: Physician Assistant

## 2017-09-23 ENCOUNTER — Encounter: Payer: Self-pay | Admitting: Family

## 2017-11-12 MED FILL — LARIN FE 1.5-30 TABLET: 1.5-30 | 84 days supply | Qty: 84 | Fill #1

## 2018-01-18 MED FILL — buPROPion HCL ER (XL) 150 M: 150 | 90 days supply | Qty: 90 | Fill #1

## 2018-01-18 MED FILL — CLOBETASOL 0.05% SOLUTION: 0.05 | 30 days supply | Qty: 50 | Fill #2

## 2018-02-02 ENCOUNTER — Encounter: Payer: Self-pay | Admitting: Family

## 2018-02-04 ENCOUNTER — Ambulatory Visit (INDEPENDENT_AMBULATORY_CARE_PROVIDER_SITE_OTHER): Payer: No Typology Code available for payment source | Admitting: Family

## 2018-02-04 ENCOUNTER — Encounter: Payer: Self-pay | Admitting: Family

## 2018-02-04 VITALS — BP 104/49 | HR 57 | Temp 98.6°F | Resp 16 | Ht 64.0 in | Wt 227.6 lb

## 2018-02-04 DIAGNOSIS — Z0000 Encounter for general adult medical examination without abnormal findings: Secondary | ICD-10-CM

## 2018-02-04 MED ORDER — PROBIOTIC ACIDOPHILUS PO CAPS: 1.0000 | ORAL_CAPSULE | Freq: Every day | ORAL | Status: DC

## 2018-02-04 NOTE — Progress Notes (Signed)
Subjective:    Patient ID: Emily [name], female    DOB: [birth year], 42 y.o.   MRN: [id number]  HPI  Patient presents today for complete physical.  Immunizations: pt will check tetanus and let us know.  Diet: working on keto diet x 3 weeks. Wt Readings from Last 3 Encounters:  02/04/18 227 lb 9.6 oz (103.2 kg)  08/17/17 218 lb (98.9 kg)  08/13/17 223 lb (101.2 kg)  Exercise:cardio/weights 5 days a week Pap Smear: 12/13/15 Mammogram: 12/01/16 Dental: up to date Vision: 10/18  Lab Results  Component Value Date   TSH 4.41 05/25/2017      Review of Systems  Constitutional: Negative for unexpected weight change.  HENT: Negative for postnasal drip.   Eyes: Negative for visual disturbance.  Respiratory: Negative for cough.   Cardiovascular:       Occasional LE edema, more so since she started keto diet  Gastrointestinal: Negative for constipation and diarrhea.  Genitourinary: Negative for dysuria, frequency and menstrual problem.  Musculoskeletal: Positive for arthralgias.       Some left hip pain and right hip. Taking collagen.  Weight loss has helped hip pain in the past.   Skin: Positive for rash.       Psoriasis has gotten worse.  Will follow up with dermatology.  Hematological: Negative for adenopathy.     Past Medical History:  Diagnosis Date  . Allergy   . Anemia   . B12 deficiency   . Back pain   . Depression   . Gallbladder problem   . Joint pain   . Lactose intolerance   . Multiple food allergies   . Palpitations   . Psoriasis   . Soft tissue mass    left flank area  . Swelling   . Vitamin D deficiency      Social History   Socioeconomic History  . Marital status: Single    Spouse name: Not on file  . Number of children: Not on file  . Years of education: Not on file  . Highest education level: Not on file  Occupational History  . Occupation: Programmer, multimedia: [employer]  . Financial resource strain: Not on file  . Food  insecurity:    Worry: Not on file    Inability: Not on file  . Transportation needs:    Medical: Not on file    Non-medical: Not on file  Tobacco Use  . Smoking status: Never Smoker  . Smokeless tobacco: Never Used  Substance and Sexual Activity  . Alcohol use: Yes    Comment: 2-3 glasses a week  . Drug use: No  . Sexual activity: Yes    Birth control/protection: Pill  Lifestyle  . Physical activity:    Days per week: Not on file    Minutes per session: Not on file  . Stress: Not on file  Relationships  . Social connections:    Talks on phone: Not on file    Gets together: Not on file    Attends religious service: Not on file    Active member of club or organization: Not on file    Attends meetings of clubs or organizations: Not on file    Relationship status: Not on file  . Intimate partner violence:    Fear of current or ex partner: Not on file    Emotionally abused: Not on file    Physically abused: Not on file    Forced sexual  activity: Not on file  Other Topics Concern  . Not on file  Social History Narrative   Works as a Engineer, maintenance at [employer]   No children   Not married   Has a dog named [place]   From [place]- moved to [place] in 2002   Parents are both living   Has one sister back home   Enjoys going out to eat with friends   Enjoys biking and kayaking.    Goes to the GYM 6 days a week    Past Surgical History:  Procedure Laterality Date  . CHOLECYSTECTOMY  2006  . COLPOSCOPY  2008  . ENDOMETRIAL BIOPSY  2017   negative per pt  . GASTRIC BYPASS  2007  . MASS EXCISION Left 08/17/2017   Procedure: EXCISION SOFT TISSUE  MASS LEFT FLANK;  Surgeon: [name], MD;  Location: [facility];  Service: General;  Laterality: Left;    Family History  Problem Relation Age of Onset  . Hypertension Mother   . Obesity Mother   . Hypertension Father   . Sleep apnea Father   . Obesity Father   . Alzheimer's disease Maternal Grandmother     . Leukemia Maternal Grandfather   . Alzheimer's disease Paternal Grandmother   . Cancer Paternal Grandfather   . Diabetes type II Maternal Aunt   . Stroke Maternal Aunt     Allergies  Allergen Reactions  . Almond Oil Swelling  . Apple Swelling  . Kiwi Extract Swelling  . Soy Allergy Other (See Comments)  . Strawberry Extract Other (See Comments)    Current Outpatient Medications on File Prior to Visit  Medication Sig Dispense Refill  . albuterol (PROVENTIL HFA;VENTOLIN HFA) 108 (90 Base) MCG/ACT inhaler Inhale 2 puffs into the lungs every 6 (six) hours as needed for wheezing or shortness of breath. 1 Inhaler 0  . BORIC ACID EX Apply topically.    Marland Kitchen buPROPion (WELLBUTRIN XL) 150 MG 24 hr tablet Take 1 tablet (150 mg total) daily by mouth. 90 tablet 1  . Cholecalciferol (VITAMIN D3) 3000 units TABS Take 1 tablet daily by mouth. 30 tablet   . clobetasol (TEMOVATE) 0.05 % external solution APPLY TO SCALP 3-4 TIMES PER WEEK AS NEEDED FOR ITCHING AND SCALING. NOT TO FACE. 50 mL 2  . Cyanocobalamin (B-12) 2500 MCG SUBL Place 2,500 mcg under the tongue daily. 30 tablet 0  . metroNIDAZOLE (METROGEL) 0.75 % vaginal gel Place 1 Applicatorful vaginally 3 (three) times a week. Apply one applicatorful to vagina at bedtime 3 times per week. 70 g 6  . Multiple Vitamin (MULTIVITAMIN) capsule Take 1 capsule by mouth daily.     . norethindrone-ethinyl estradiol-iron (MICROGESTIN FE 1.5/30) 1.5-30 MG-MCG tablet Take 1 tablet by mouth daily. 3 Package 4  . traMADol (ULTRAM) 50 MG tablet Take 1-2 tablets (50-100 mg total) every 6 (six) hours as needed by mouth for moderate pain or severe pain. 10 tablet 0   No current facility-administered medications on file prior to visit.     BP (!) 104/49 (BP Location: Right Arm, Cuff Size: Large)   Pulse (!) 57   Temp 98.6 F (37 C) (Oral)   Resp 16   Ht 5\' 4"  (1.626 m)   Wt 227 lb 9.6 oz (103.2 kg)   SpO2 100%   BMI 39.07 kg/m       Objective:    Physical Exam  Physical Exam  Constitutional: She is oriented to  person, place, and time. She appears well-developed and well-nourished. No distress.  HENT:  Head: Normocephalic and atraumatic.  Right Ear: Tympanic membrane and ear canal normal.  Left Ear: Tympanic membrane and ear canal normal.  Mouth/Throat: Oropharynx is clear and moist.  Eyes: Pupils are equal, round, and reactive to light. No scleral icterus.  Neck: Normal range of motion. No thyromegaly present.  Cardiovascular: Normal rate and regular rhythm.   No murmur heard. Pulmonary/Chest: Effort normal and breath sounds normal. No respiratory distress. He has no wheezes. She has no rales. She exhibits no tenderness.  Abdominal: Soft. Bowel sounds are normal. She exhibits no distension and no mass. There is no tenderness. There is no rebound and no guarding.  Musculoskeletal: She exhibits no edema.  Lymphadenopathy:    She has no cervical adenopathy.  Neurological: She is alert and oriented to person, place, and time. She has normal patellar reflexes. She exhibits normal muscle tone. Coordination normal.  Skin: Skin is warm and dry.  Psychiatric: She has a normal mood and affect. Her behavior is normal. Judgment and thought content normal.  Breasts: Examined lying Right: Without masses, retractions, discharge or axillary adenopathy.  Left: Without masses, retractions, discharge or axillary adenopathy.  Pelvic: deferred to GYN           Assessment & Plan:   Preventative care- refer for mammogram. Encouraged her to continue to work on healthy diet, exercise and weight loss.  She will complete fasting lab work at [facility] on Monday. Pap up to date. She will check status of tetanus shot and let us know.     EKG tracing is personally reviewed.  EKG notes NSR.  No acute changes.   Assessment & Plan:

## 2018-02-04 NOTE — Patient Instructions (Signed)
Please complete lab work on Monday at [facility] fasting. Continue to work on diet/exercise/weight loss.

## 2018-02-07 ENCOUNTER — Other Ambulatory Visit (INDEPENDENT_AMBULATORY_CARE_PROVIDER_SITE_OTHER): Payer: No Typology Code available for payment source

## 2018-02-07 DIAGNOSIS — Z0000 Encounter for general adult medical examination without abnormal findings: Secondary | ICD-10-CM

## 2018-02-08 ENCOUNTER — Encounter: Payer: Self-pay | Admitting: Family

## 2018-02-08 LAB — URINALYSIS, ROUTINE W REFLEX MICROSCOPIC
BILIRUBIN UA: NEGATIVE
Glucose, UA: NEGATIVE
Ketones, UA: NEGATIVE
NITRITE UA: NEGATIVE
PH UA: 5.5 (ref 5.0–7.5)
Protein, UA: NEGATIVE
RBC UA: NEGATIVE
Specific Gravity, UA: 1.024 (ref 1.005–1.030)
UUROB: 0.2 mg/dL (ref 0.2–1.0)

## 2018-02-08 LAB — BASIC METABOLIC PANEL
BUN / CREAT RATIO: 22 (ref 9–23)
BUN: 15 mg/dL (ref 6–24)
CHLORIDE: 105 mmol/L (ref 96–106)
CO2: 22 mmol/L (ref 20–29)
Calcium: 8.9 mg/dL (ref 8.7–10.2)
Creatinine, Ser: 0.69 mg/dL (ref 0.57–1.00)
GFR calc non Af Amer: 106 mL/min/{1.73_m2} (ref >59)
GFR, EST AFRICAN AMERICAN: 122 mL/min/{1.73_m2} (ref >59)
GLUCOSE: 89 mg/dL (ref 65–99)
POTASSIUM: 4.4 mmol/L (ref 3.5–5.2)
SODIUM: 142 mmol/L (ref 134–144)

## 2018-02-08 LAB — MICROSCOPIC EXAMINATION: CASTS: NONE SEEN /LPF

## 2018-02-08 LAB — CBC WITH DIFFERENTIAL/PLATELET
BASOS ABS: 0 10*3/uL (ref 0.0–0.2)
Basos: 1 %
EOS (ABSOLUTE): 0.2 10*3/uL (ref 0.0–0.4)
Eos: 4 %
HEMATOCRIT: 42.3 % (ref 34.0–46.6)
Hemoglobin: 13.2 g/dL (ref 11.1–15.9)
Immature Grans (Abs): 0 10*3/uL (ref 0.0–0.1)
Immature Granulocytes: 0 %
LYMPHS ABS: 2 10*3/uL (ref 0.7–3.1)
Lymphs: 40 %
MCH: 27.3 pg (ref 26.6–33.0)
MCHC: 31.2 g/dL — AB (ref 31.5–35.7)
MCV: 87 fL (ref 79–97)
MONOCYTES: 5 %
MONOS ABS: 0.3 10*3/uL (ref 0.1–0.9)
NEUTROS ABS: 2.5 10*3/uL (ref 1.4–7.0)
Neutrophils: 50 %
Platelets: 335 10*3/uL (ref 150–379)
RBC: 4.84 x10E6/uL (ref 3.77–5.28)
RDW: 13.7 % (ref 12.3–15.4)
WBC: 5 10*3/uL (ref 3.4–10.8)

## 2018-02-08 LAB — HEPATIC FUNCTION PANEL
ALBUMIN: 3.8 g/dL (ref 3.5–5.5)
ALT: 34 IU/L — ABNORMAL HIGH (ref 0–32)
AST: 24 IU/L (ref 0–40)
Alkaline Phosphatase: 74 IU/L (ref 39–117)
BILIRUBIN TOTAL: 0.9 mg/dL (ref 0.0–1.2)
Bilirubin, Direct: 0.26 mg/dL (ref 0.00–0.40)
TOTAL PROTEIN: 6.4 g/dL (ref 6.0–8.5)

## 2018-02-08 LAB — TSH: TSH: 2.86 u[IU]/mL (ref 0.450–4.500)

## 2018-02-08 LAB — LIPID PANEL
CHOL/HDL RATIO: 1.8 ratio (ref 0.0–4.4)
CHOLESTEROL TOTAL: 200 mg/dL — AB (ref 100–199)
HDL: 113 mg/dL (ref >39)
LDL CALC: 74 mg/dL (ref 0–99)
Triglycerides: 64 mg/dL (ref 0–149)
VLDL Cholesterol Cal: 13 mg/dL (ref 5–40)

## 2018-02-10 MED ORDER — NITROFURANTOIN MONOHYD MACRO 100 MG PO CAPS: 100.0000 mg | ORAL_CAPSULE | Freq: Two times a day (BID) | ORAL | 0 refills | Status: DC

## 2018-02-10 MED FILL — NITROFURANTOIN MONO-MCR 100: 100 | 7 days supply | Qty: 14 | Fill #0

## 2018-02-11 ENCOUNTER — Encounter: Payer: Self-pay | Admitting: Family

## 2018-02-17 MED FILL — LARIN FE 1.5-30 TABLET: 1.5-30 | 84 days supply | Qty: 84 | Fill #2

## 2018-03-02 ENCOUNTER — Encounter: Payer: Self-pay | Admitting: Family

## 2018-03-02 MED ORDER — VALACYCLOVIR HCL 1 G PO TABS: ORAL_TABLET | ORAL | 3 refills | Status: DC

## 2018-03-02 MED FILL — valACYclovir HCL 1 GM TABS: 1 | 2 days supply | Qty: 4 | Fill #0

## 2018-03-25 MED FILL — metroNIDAZOLE 0.75 % GEL: 0.75 | 20 days supply | Qty: 70 | Fill #2

## 2018-05-17 ENCOUNTER — Encounter: Payer: Self-pay | Admitting: Family

## 2018-06-03 MED FILL — TACLONEX SCALP SUSPENSION: 0.005-0.064 | 30 days supply | Qty: 120 | Fill #0

## 2018-06-28 MED FILL — LARIN FE 1.5-30 TABLET: 1.5-30 | 84 days supply | Qty: 84 | Fill #3

## 2018-07-06 ENCOUNTER — Encounter: Payer: Self-pay | Admitting: Internal Medicine

## 2018-07-06 ENCOUNTER — Ambulatory Visit (INDEPENDENT_AMBULATORY_CARE_PROVIDER_SITE_OTHER): Payer: No Typology Code available for payment source | Admitting: Internal Medicine

## 2018-07-06 DIAGNOSIS — Z7184 Encounter for health counseling related to travel: Secondary | ICD-10-CM

## 2018-07-06 DIAGNOSIS — Z789 Other specified health status: Secondary | ICD-10-CM

## 2018-07-06 DIAGNOSIS — Z7185 Encounter for immunization safety counseling: Secondary | ICD-10-CM

## 2018-07-06 DIAGNOSIS — Z7189 Other specified counseling: Secondary | ICD-10-CM

## 2018-07-06 DIAGNOSIS — Z23 Encounter for immunization: Secondary | ICD-10-CM

## 2018-07-06 DIAGNOSIS — Z9189 Other specified personal risk factors, not elsewhere classified: Secondary | ICD-10-CM

## 2018-07-06 MED ORDER — ZITHROMAX 500 MG PO TABS: 1000.0000 mg | ORAL_TABLET | Freq: Once | ORAL | 0 refills | Status: AC

## 2018-07-06 MED ORDER — ATOVAQUONE-PROGUANIL HCL 250-100 MG PO TABS: 1.0000 | ORAL_TABLET | Freq: Every day | ORAL | 0 refills | Status: DC

## 2018-07-06 MED FILL — ATOVAQUONE-PROGUANIL 250-10: 250-100 | 10 days supply | Qty: 10 | Fill #0

## 2018-07-06 MED FILL — AZITHROMYCIN 500 MG TABLET: 500 | 2 days supply | Qty: 4 | Fill #0

## 2018-07-06 NOTE — Progress Notes (Signed)
Subjective:   Emily [name] is a 42 y.o. female who presents to the Infectious Disease clinic for travel consultation. Planned departure date: July 11, 2018          Planned return date: 2 weeks Countries of travel: [place] Areas in country: safari, hotels   Accommodations: hotel Purpose of travel: vacation Prior travel out of [name]: yes     Objective:   Medications: reviewed    Assessment:   No contraindications to travel. none     Plan:    Issues discussed: environmental concerns, freshwater swimming, future shots, insect-borne illnesses, malaria, MVA safety, rabies, safe food/water, traveler's diarrhea, website/handouts for more information, what to do if ill upon return, what to do if ill while there and Yellow Fever. Immunizations recommended: Typhoid (parenteral). Malaria prophylaxis: malarone, daily dose starting 1-2 days before entering endemic area, ending 7 days after leaving area Traveler's diarrhea prophylaxis: azithromycin. Total duration of visit: 1 Hour. Total time spent on education, counseling, coordination of care: 30 Minutes.

## 2018-07-06 NOTE — Patient Instructions (Signed)
[facility]                [address] [place]                   [place], [place] [address]                      Phone: [phone]                        Fax: [phone]   Planned departure date: July 11 2018          Planned return date: 2 weeks Countries of travel: [place]   Guidelines for the Prevention & Treatment of Traveler's Diarrhea  Prevention: "Boil it, Peel it, Lacinda Axon it, or Forget it"   the fewer chances -> lower risk: try to stick to food & water precautions as much as possible"   If it's "piping hot"; it is probably okay, if not, it may not be   Treatment   1) You should always take care to drink lots of fluids in order to avoid dehydration   2) You should bring medications with you in case you come down with a case of diarrhea   3) OTC = bring pepto-bismol - can take with initial abdominal symptoms;                    Imodium - can help slow down your intestinal tract, can help relief cramps                    and diarrhea, can take if no bloody diarrhea  Use azithromycin if needed for traveler's diarrhea  Guidelines for the Prevention of Malaria  Avoidance:  -fewer mosquito bites = lower risk. Mosquitos can bite at night as well as daytime  -cover up (long sleeve clothing), mosquito nets, screens  -Insect repellent for your skin ( DEET containing lotion > 20%): for clothes ( permethrin spray)   2 days prior to travel to the safari, start malarone, daily dose starting 1-2 days before entering endemic area, ending 7 days after leaving area for malaria prevention.   Immunizations received today: Typhoid (parenteral)  Future immunizations, if indicated none indicated   Prior to travel:  1) Be sure to pick up appropriate prescriptions, including medicine you take daily. Do not expect to be able to fill your prescriptions abroad.  2) Strongly consider obtaining traveler's insurance, including emergency  evacuation insurance. Most plans in the [name] do not cover participants abroad. (see below for resources)  3) Register at the appropriate [place] embassy or consulate with travel dates so they are aware of your presence in-country and for helpful advice during travel using the [employer] (STEP, GreenNylon.com.cy).  4) Leave contact information with a relative or friend.  5) Keep a Research officer, political party, credit cards in case they become lost or stolen  6) Inform your credit card company that you will be travelling abroad   During travel:  1) If you become ill and need medical advice, the [place] [employer]	[place] of the country you are traveling in general provides a list of [place] speaking doctors.  We are also available on MyChart for remote consultation if you register prior to travel. 2) Avoid motorcycles or scooters when at all possible. Traffic laws in many countries are lax and accidents occur frequently.  3) Do not take any unnecessary risks that you wouldn't do at home.   Resources:  -Country specific information: BlindResource.ca or GreenNylon.com.cy  -Press photographer (DEET, mosquito nets): REI, [employer]'s [employer] Goods store, [employer], [place] insurance options: gatewayplans.com; http://clayton-rivera.info/; travelguard.com or Good [employer], gninsurance.com or info@gninsurance .com, [id number].   Post Travel:  If you return from your trip ill, call your primary care doctor or our travel clinic @ [phone].   Enjoy your trip and know that with proper pre-travel preparation, most people have an enjoyable and uninterrupted trip!

## 2018-10-05 DIAGNOSIS — Z8616 Personal history of COVID-19: Secondary | ICD-10-CM

## 2018-10-05 HISTORY — DX: Personal history of COVID-19: Z86.16

## 2018-10-18 MED FILL — LARIN FE 1.5-30 TABLET: 1.5-30 | 84 days supply | Qty: 84 | Fill #0

## 2019-05-24 MED FILL — LARIN FE 1.5-30 TABLET: 1.5-30 | 84 days supply | Qty: 84 | Fill #1

## 2019-05-26 ENCOUNTER — Other Ambulatory Visit: Payer: Self-pay

## 2019-05-26 ENCOUNTER — Encounter: Payer: Self-pay | Admitting: Family

## 2019-05-26 ENCOUNTER — Ambulatory Visit (INDEPENDENT_AMBULATORY_CARE_PROVIDER_SITE_OTHER): Payer: No Typology Code available for payment source | Admitting: Family

## 2019-05-26 VITALS — BP 120/72 | HR 65 | Temp 96.9°F | Resp 16 | Ht 64.0 in | Wt 232.0 lb

## 2019-05-26 DIAGNOSIS — E538 Deficiency of other specified B group vitamins: Secondary | ICD-10-CM | POA: Diagnosis not present

## 2019-05-26 DIAGNOSIS — Z0000 Encounter for general adult medical examination without abnormal findings: Secondary | ICD-10-CM

## 2019-05-26 DIAGNOSIS — E559 Vitamin D deficiency, unspecified: Secondary | ICD-10-CM

## 2019-05-26 LAB — BASIC METABOLIC PANEL
BUN: 20 mg/dL (ref 6–23)
CO2: 26 mEq/L (ref 19–32)
Calcium: 8.8 mg/dL (ref 8.4–10.5)
Chloride: 105 mEq/L (ref 96–112)
Creatinine, Ser: 0.68 mg/dL (ref 0.40–1.20)
GFR: 93.39 mL/min (ref >60.00)
Glucose, Bld: 91 mg/dL (ref 70–99)
Potassium: 4.1 mEq/L (ref 3.5–5.1)
Sodium: 138 mEq/L (ref 135–145)

## 2019-05-26 LAB — CBC WITH DIFFERENTIAL/PLATELET
Basophils Absolute: 0 10*3/uL (ref 0.0–0.1)
Basophils Relative: 0.5 % (ref 0.0–3.0)
Eosinophils Absolute: 0.1 10*3/uL (ref 0.0–0.7)
Eosinophils Relative: 2.8 % (ref 0.0–5.0)
HCT: 35.9 % — ABNORMAL LOW (ref 36.0–46.0)
Hemoglobin: 11.8 g/dL — ABNORMAL LOW (ref 12.0–15.0)
Lymphocytes Relative: 34.6 % (ref 12.0–46.0)
Lymphs Abs: 1.7 10*3/uL (ref 0.7–4.0)
MCHC: 33 g/dL (ref 30.0–36.0)
MCV: 79.3 fl (ref 78.0–100.0)
Monocytes Absolute: 0.3 10*3/uL (ref 0.1–1.0)
Monocytes Relative: 5.7 % (ref 3.0–12.0)
Neutro Abs: 2.8 10*3/uL (ref 1.4–7.7)
Neutrophils Relative %: 56.4 % (ref 43.0–77.0)
Platelets: 343 10*3/uL (ref 150.0–400.0)
RBC: 4.53 Mil/uL (ref 3.87–5.11)
RDW: 14.2 % (ref 11.5–15.5)
WBC: 5 10*3/uL (ref 4.0–10.5)

## 2019-05-26 LAB — LIPID PANEL
Cholesterol: 204 mg/dL — ABNORMAL HIGH (ref 0–200)
HDL: 103.5 mg/dL (ref >39.00)
LDL Cholesterol: 88 mg/dL (ref 0–99)
NonHDL: 100.75
Total CHOL/HDL Ratio: 2
Triglycerides: 65 mg/dL (ref 0.0–149.0)
VLDL: 13 mg/dL (ref 0.0–40.0)

## 2019-05-26 LAB — TSH: TSH: 2.07 u[IU]/mL (ref 0.35–4.50)

## 2019-05-26 LAB — HEPATIC FUNCTION PANEL
ALT: 25 U/L (ref 0–35)
AST: 26 U/L (ref 0–37)
Albumin: 4.1 g/dL (ref 3.5–5.2)
Alkaline Phosphatase: 76 U/L (ref 39–117)
Bilirubin, Direct: 0.2 mg/dL (ref 0.0–0.3)
Total Bilirubin: 0.9 mg/dL (ref 0.2–1.2)
Total Protein: 6.7 g/dL (ref 6.0–8.3)

## 2019-05-26 LAB — VITAMIN B12: Vitamin B-12: 187 pg/mL — ABNORMAL LOW (ref 211–911)

## 2019-05-26 NOTE — Progress Notes (Signed)
Subjective:    Patient ID: Emily [name], female    DOB: 1976/03/05, 43 y.o.   MRN: [id number]  HPI  Patient presents today for complete physical.  Immunizations: Tdap 2017 Diet: She is doing weight watchers and HIIT program.    Wt Readings from Last 3 Encounters:  05/26/19 232 lb (105.2 kg)  02/04/18 227 lb 9.6 oz (103.2 kg)  08/17/17 218 lb (98.9 kg)  Exercise:5 days a week Pap Smear: scheduled next month Mammogram: due Dental:up to date Vision: 10/19    Review of Systems  Constitutional: Negative for unexpected weight change.  HENT: Negative for hearing loss and rhinorrhea.   Respiratory: Negative for cough and shortness of breath.   Cardiovascular: Negative for chest pain and leg swelling.  Gastrointestinal: Negative for blood in stool, constipation and diarrhea.  Genitourinary: Negative for dysuria, frequency and menstrual problem.  Musculoskeletal: Negative for arthralgias and myalgias.  Skin: Positive for rash (some psoriasis).  Neurological:       Reports that she has had a mild HA daily x 10 days, she will let me know if this does not improve.   Psychiatric/Behavioral:       Denies depression/anxiety   Past Medical History:  Diagnosis Date  . Allergy   . Anemia   . B12 deficiency   . Back pain   . Depression   . Gallbladder problem   . Joint pain   . Lactose intolerance   . Multiple food allergies   . Palpitations   . Psoriasis   . Soft tissue mass    left flank area  . Swelling   . Vitamin D deficiency      Social History   Socioeconomic History  . Marital status: Single    Spouse name: Not on file  . Number of children: Not on file  . Years of education: Not on file  . Highest education level: Not on file  Occupational History  . Occupation: Programmer, multimedia: [employer]  . Financial resource strain: Not on file  . Food insecurity    Worry: Not on file    Inability: Not on file  . Transportation needs    Medical: Not  on file    Non-medical: Not on file  Tobacco Use  . Smoking status: Never Smoker  . Smokeless tobacco: Never Used  Substance and Sexual Activity  . Alcohol use: Yes    Comment: 2-3 glasses a week  . Drug use: No  . Sexual activity: Yes    Birth control/protection: Pill  Lifestyle  . Physical activity    Days per week: Not on file    Minutes per session: Not on file  . Stress: Not on file  Relationships  . Social Herbalist on phone: Not on file    Gets together: Not on file    Attends religious service: Not on file    Active member of club or organization: Not on file    Attends meetings of clubs or organizations: Not on file    Relationship status: Not on file  . Intimate partner violence    Fear of current or ex partner: Not on file    Emotionally abused: Not on file    Physically abused: Not on file    Forced sexual activity: Not on file  Other Topics Concern  . Not on file  Social History Narrative   Works as a Engineer, maintenance at [employer]   No children   Not married   Has a dog named Lucy   From [place]- moved to [place] in 2002   Parents are both living   Has one sister back home   Enjoys going out to eat with friends   Enjoys biking and kayaking.    Goes to the GYM 6 days a week    Past Surgical History:  Procedure Laterality Date  . CHOLECYSTECTOMY  2006  . COLPOSCOPY  2008  . ENDOMETRIAL BIOPSY  2017   negative per pt  . GASTRIC BYPASS  2007  . MASS EXCISION Left 08/17/2017   Procedure: EXCISION SOFT TISSUE  MASS LEFT FLANK;  Surgeon: [name], MD;  Location: [facility];  Service: General;  Laterality: Left;    Family History  Problem Relation Age of Onset  . Hypertension Mother   . Obesity Mother   . Hypertension Father   . Sleep apnea Father   . Obesity Father   . Alzheimer's disease Maternal Grandmother   . Leukemia Maternal Grandfather   . Alzheimer's disease Paternal Grandmother   . Cancer Paternal  Grandfather   . Diabetes type II Maternal Aunt   . Stroke Maternal Aunt     Allergies  Allergen Reactions  . Almond Oil Swelling  . Apple Swelling  . Kiwi Extract Swelling  . Soy Allergy Other (See Comments)  . Strawberry Extract Other (See Comments)    Current Outpatient Medications on File Prior to Visit  Medication Sig Dispense Refill  . norethindrone-ethinyl estradiol-iron (MICROGESTIN FE 1.5/30) 1.5-30 MG-MCG tablet Take 1 tablet by mouth daily. 3 Package 4  . TURMERIC CURCUMIN PO      No current facility-administered medications on file prior to visit.     BP 120/72 (BP Location: Right Arm, Patient Position: Sitting, Cuff Size: Large)   Pulse 65   Temp (!) 96.9 F (36.1 C) (Temporal)   Resp 16   Ht 5\' 4"  (1.626 m)   Wt 232 lb (105.2 kg)   SpO2 99%   BMI 39.82 kg/m       Objective:   Physical Exam  Physical Exam  Constitutional: She is oriented to person, place, and time. She appears well-developed and well-nourished. No distress.  HENT:  Head: Normocephalic and atraumatic.  Right Ear: Tympanic membrane and ear canal normal.  Left Ear: Tympanic membrane and ear canal normal.  Mouth/Throat: Not examined- pt wearing mask Eyes: Pupils are equal, round, and reactive to light. No scleral icterus.  Neck: Normal range of motion. No thyromegaly present.  Cardiovascular: Normal rate and regular rhythm.   No murmur heard. Pulmonary/Chest: Effort normal and breath sounds normal. No respiratory distress. He has no wheezes. She has no rales. She exhibits no tenderness.  Abdominal: Soft. Bowel sounds are normal. She exhibits no distension and no mass. There is no tenderness. There is no rebound and no guarding.  Musculoskeletal: She exhibits no edema.  Lymphadenopathy:    She has no cervical adenopathy.  Neurological: She is alert and oriented to person, place, and time. She has 1+ bilateral patellar reflexes. She exhibits normal muscle tone. Coordination normal.  Skin:  Skin is warm and dry.  Psychiatric: She has a normal mood and affect. Her behavior is normal. Judgment and thought content normal.            Assessment & Plan:    Preventative care- encouraged pt to continue healthy diet, exercise, weight loss efforts. She will  complete pap with GYN, mammogram order placed. Immunizations reviewed and up to date.  Obtain routine lab work.       Assessment & Plan:

## 2019-05-26 NOTE — Patient Instructions (Signed)
Please complete lab work prior to leaving. Schedule mammogram on the first floor.  

## 2019-05-28 ENCOUNTER — Encounter: Payer: Self-pay | Admitting: Family

## 2019-05-28 DIAGNOSIS — E559 Vitamin D deficiency, unspecified: Secondary | ICD-10-CM

## 2019-05-28 DIAGNOSIS — E538 Deficiency of other specified B group vitamins: Secondary | ICD-10-CM

## 2019-05-28 DIAGNOSIS — D649 Anemia, unspecified: Secondary | ICD-10-CM

## 2019-05-29 LAB — VITAMIN D 1,25 DIHYDROXY
Vitamin D 1, 25 (OH)2 Total: 101 pg/mL — ABNORMAL HIGH (ref 18–72)
Vitamin D2 1, 25 (OH)2: <8 pg/mL
Vitamin D3 1, 25 (OH)2: 101 pg/mL

## 2019-05-30 ENCOUNTER — Encounter: Payer: Self-pay | Admitting: Family

## 2019-06-02 ENCOUNTER — Ambulatory Visit (HOSPITAL_BASED_OUTPATIENT_CLINIC_OR_DEPARTMENT_OTHER): Payer: No Typology Code available for payment source

## 2019-06-07 ENCOUNTER — Ambulatory Visit (HOSPITAL_BASED_OUTPATIENT_CLINIC_OR_DEPARTMENT_OTHER)
Admission: RE | Admit: 2019-06-07 | Discharge: 2019-06-07 | Disposition: A | Discharge status: Home / Self Care | Payer: No Typology Code available for payment source | Source: Ambulatory Visit | Attending: Family | Admitting: Family

## 2019-06-07 ENCOUNTER — Other Ambulatory Visit: Payer: Self-pay

## 2019-06-07 DIAGNOSIS — Z0000 Encounter for general adult medical examination without abnormal findings: Secondary | ICD-10-CM | POA: Insufficient documentation

## 2019-06-07 DIAGNOSIS — Z1231 Encounter for screening mammogram for malignant neoplasm of breast: Secondary | ICD-10-CM | POA: Diagnosis not present

## 2019-06-15 ENCOUNTER — Encounter: Payer: Self-pay | Admitting: Obstetrics & Gynecology

## 2019-06-15 ENCOUNTER — Other Ambulatory Visit: Payer: Self-pay

## 2019-06-15 ENCOUNTER — Ambulatory Visit (INDEPENDENT_AMBULATORY_CARE_PROVIDER_SITE_OTHER): Payer: No Typology Code available for payment source | Admitting: Obstetrics & Gynecology

## 2019-06-15 VITALS — BP 126/50 | HR 56 | Ht 64.0 in | Wt 231.1 lb

## 2019-06-15 DIAGNOSIS — Z1151 Encounter for screening for human papillomavirus (HPV): Secondary | ICD-10-CM

## 2019-06-15 DIAGNOSIS — Z3041 Encounter for surveillance of contraceptive pills: Secondary | ICD-10-CM

## 2019-06-15 DIAGNOSIS — Z01419 Encounter for gynecological examination (general) (routine) without abnormal findings: Secondary | ICD-10-CM

## 2019-06-15 DIAGNOSIS — Z124 Encounter for screening for malignant neoplasm of cervix: Secondary | ICD-10-CM | POA: Diagnosis not present

## 2019-06-15 DIAGNOSIS — Z3009 Encounter for other general counseling and advice on contraception: Secondary | ICD-10-CM | POA: Diagnosis not present

## 2019-06-15 MED ORDER — NORETHIN ACE-ETH ESTRAD-FE 1.5-30 MG-MCG PO TABS: 1.0000 | ORAL_TABLET | Freq: Every day | ORAL | 4 refills | Status: DC

## 2019-06-15 NOTE — Progress Notes (Signed)
Subjective:     Emily [name] is a 43 y.o. female G0 here for a routine exam. LMP pill amenorrhea due to continuous pill use. Current complaints: none. No BV for >1 year.      Gynecologic History Patient's last menstrual period was 06/12/2019. Contraception: OCP (estrogen/progesterone) Last Pap: 2017 in [place] Results were: normal. H/o irreg PAP 10 years prev Last mammogram: 06/2019. Results were: normal  Obstetric History OB History  Gravida Para Term Preterm AB Living  0 0 0 0 0 0  SAB TAB Ectopic Multiple Live Births  0 0 0 0 0     The following portions of the patient's history were reviewed and updated as appropriate: allergies, current medications, past family history, past medical history, past social history, past surgical history and problem list.  Review of Systems Pertinent items are noted in HPI.    Objective:  BP (!) 126/50   Pulse (!) 56   Ht 5\' 4"  (1.626 m)   Wt 231 lb 1.3 oz (104.8 kg)   LMP 06/12/2019   BMI 39.66 kg/m   General Appearance:    Alert, cooperative, no distress, appears stated age  Head:    Normocephalic, without obvious abnormality, atraumatic  Eyes:    conjunctiva/corneas clear, EOM's intact, both eyes  Ears:    Normal external ear canals, both ears  Nose:   Nares normal, septum midline, mucosa normal, no drainage    or sinus tenderness  Throat:   Lips, mucosa, and tongue normal; teeth and gums normal  Neck:   Supple, symmetrical, trachea midline, no adenopathy;    thyroid:  no enlargement/tenderness/nodules  Back:     Symmetric, no curvature, ROM normal, no CVA tenderness  Lungs:     Clear to auscultation bilaterally, respirations unlabored  Chest Wall:    No tenderness or deformity   Heart:    Regular rate and rhythm, S1 and S2 normal, no murmur, rub   or gallop  Breast Exam:    No tenderness, masses, or nipple abnormality  Abdomen:     Soft, non-tender, bowel sounds active all four quadrants,    no masses, no organomegaly   Genitalia:    Normal female without lesion, discharge or tenderness     Extremities:   Extremities normal, atraumatic, no cyanosis or edema  Pulses:   2+ and symmetric all extremities  Skin:   Skin color, texture, turgor normal, some areas of psoriases on abd and vulva and right breast    Assessment:    Healthy female exam.   Contraception counseling- cont OCPs     Plan:    Follow up in: 1 year.    F/u sooner prn  F/u PAP with hrHPV  Earl Zellmer L. Harraway-Smith, M.D., Cherlynn June

## 2019-06-19 LAB — CYTOLOGY - PAP: HPV: NOT DETECTED

## 2019-06-26 ENCOUNTER — Telehealth: Payer: Self-pay

## 2019-06-26 NOTE — Telephone Encounter (Signed)
Left message for patient to return call to office. Branch Pacitti  RN 

## 2019-06-26 NOTE — Telephone Encounter (Signed)
-----   Message from Lavonia Drafts, MD sent at 06/26/2019 10:07 AM EDT ----- Please call pt. She has a abnormal PAP (LGSIL with NEG hrHPV). Because of her prev abnormal PAP, I recommend that we do a colpo. Please inform and schedule.   Thx,  Dr. Ihor Dow

## 2019-06-27 NOTE — Telephone Encounter (Signed)
Patient called and made aware of pap results. Patient scheduled for colposcopy. Kathrene Alu RN

## 2019-08-07 ENCOUNTER — Other Ambulatory Visit: Payer: Self-pay

## 2019-08-07 ENCOUNTER — Ambulatory Visit (INDEPENDENT_AMBULATORY_CARE_PROVIDER_SITE_OTHER): Payer: No Typology Code available for payment source | Admitting: Obstetrics & Gynecology

## 2019-08-07 ENCOUNTER — Encounter: Payer: Self-pay | Admitting: Obstetrics & Gynecology

## 2019-08-07 ENCOUNTER — Other Ambulatory Visit (HOSPITAL_COMMUNITY)
Admission: RE | Admit: 2019-08-07 | Discharge: 2019-08-07 | Disposition: A | Discharge status: Home / Self Care | Payer: No Typology Code available for payment source | Source: Ambulatory Visit | Attending: Obstetrics & Gynecology | Admitting: Obstetrics & Gynecology

## 2019-08-07 VITALS — BP 134/60 | HR 55 | Wt 231.0 lb

## 2019-08-07 DIAGNOSIS — Z3202 Encounter for pregnancy test, result negative: Secondary | ICD-10-CM

## 2019-08-07 DIAGNOSIS — N870 Mild cervical dysplasia: Secondary | ICD-10-CM

## 2019-08-07 DIAGNOSIS — R87612 Low grade squamous intraepithelial lesion on cytologic smear of cervix (LGSIL): Secondary | ICD-10-CM

## 2019-08-07 LAB — POCT URINE PREGNANCY: Preg Test, Ur: NEGATIVE

## 2019-08-07 NOTE — Progress Notes (Signed)
Patient given informed consent, signed copy in the chart, time out was performed.  Placed in lithotomy position. Cervix viewed with speculum and colposcope after application of acetic acid.  06/15/2019 LGSIL with neg hrHPV  Colposcopy adequate?  yes Acetowhite lesions? yes Punctation? No  Mosaicism?  no Abnormal vasculature?  no Biopsies? Yes at 7 o'clock ECC? yes   Patient was given post procedure instructions.  She will return in 2 weeks virtually for results.  Blythe Hartshorn L. Harraway-Smith, M.D., Cherlynn June

## 2019-08-07 NOTE — Patient Instructions (Signed)
Colposcopy Colposcopy is a procedure to examine the lowest part of the uterus (cervix), the vagina, and the area around the vaginal opening (vulva) for abnormalities or signs of disease. The procedure is done using a lighted microscope or magnifying lens (colposcope). If any unusual cells are found during the procedure, your health care provider may remove a tissue sample for testing (biopsy). A colposcopy may be done if you:  Have an abnormal Pap test. A Pap test is a screening test that is used to check for signs of cancer or infection of the vagina, cervix, and uterus.  Have a Pap smear test in which you test positive for high-risk HPV (human papillomavirus).  Have a sore or lesion on your cervix.  Have genital warts on your vulva, vagina, or cervix.  Took certain medicines while pregnant, such as diethylstilbestrol (DES).  Have pain during sexual intercourse.  Have vaginal bleeding, especially after sexual intercourse.  Need to have a cervical polyp removed.  Need to have a lost intrauterine device (IUD) string located. Let your health care provider know about:  Any allergies you have, including allergies to prescribed medicine, latex, or iodine.  All medicines you are taking, including vitamins, herbs, eye drops, creams, and over-the-counter medicines. Bring a list of all of your medicines to your appointment.  Any problems you or family members have had with anesthetic medicines.  Any blood disorders you have.  Any surgeries you have had.  Any medical conditions you have, such as pelvic inflammatory disease (PID) or endometrial disorder.  Any history of frequent fainting.  Your menstrual cycle and what form of birth control (contraception) you use.  Your medical history, including any prior cervical treatment.  Whether you are pregnant or may be pregnant. What are the risks? Generally, this is a safe procedure. However, problems may occur, including:  Pain.   Infection, which may include a fever, bad-smelling discharge, or pelvic pain.  Bleeding or discharge.  Misdiagnosis.  Fainting and vasovagal reactions, but this is rare.  Allergic reactions to medicines.  Damage to other structures or organs. What happens before the procedure?  If you have your menstrual period or will have it at the time of your procedure, tell your health care provider. A colposcopy typically is not done during menstruation.  Continue your contraceptive practices before and after the procedure.  For 24 hours before the colposcopy: ? Do not douche. ? Do not use tampons. ? Do not use medicines, creams, or suppositories in the vagina. ? Do not have sexual intercourse.  Ask your health care provider about: ? Changing or stopping your regular medicines. This is especially important if you are taking diabetes medicines or blood thinners. ? Taking medicines such as aspirin and ibuprofen. These medicines can thin your blood. Do not take these medicines before your procedure if your health care provider instructs you not to. It is likely that your health care provider will tell you to avoid taking aspirin or medicine that contains aspirin for 7 days before the procedure.  Follow instructions from your health care provider about eating or drinking restrictions. You will likely need to eat a regular diet the day of the procedure and not skip any meals.  You may have an exam or testing. A pregnancy test will be taken on the day of the procedure.  You may have a blood or urine sample taken.  Plan to have someone take you home from the hospital or clinic.  If you will be going  home right after the procedure, plan to have someone with you for 24 hours. What happens during the procedure?  You will lie down on your back, with your feet in foot rests (stirrups).  A warmed and lubricated instrument (speculum) will be inserted into your vagina. The speculum will be used to hold  apart the walls of your vagina so your health care provider can see your cervix and the inside of your vagina.  A cotton swab will be used to place a small amount of liquid solution on the areas to be examined. This solution makes it easier to see abnormal cells. You may feel a slight burning during this part.  The colposcope will be used to scan the cervix with a bright white light. The colposcope will be held near your vulvaand will magnify your vulva, vagina, and cervix for easier examination.  Your health care provider may decide to take a biopsy. If so: ? You may be given medicine to numb the area (local anesthetic). ? Surgical instruments will be used to suck out mucus and cells through your vagina. ? You may feel mild pain while the tissue sample is removed. ? Bleeding may occur. A solution may be used to stop the bleeding. ? If a sample of tissue is needed from the inside of the cervix, a different procedure called endocervical curettage (ECC) may be completed. During this procedure, a curved instrument (curette) will be used to scrape cells from your cervix or the top of your cervix (endocervix).  Your health care provider will record the location of any abnormalities. The procedure may vary among health care providers and hospitals. What happens after the procedure?  You will lie down and rest for a few minutes. You may be offered juice or cookies.  Your blood pressure, heart rate, breathing rate, and blood oxygen level will be monitored until any medicines you were given have worn off.  You may have to wear compression stockings. These stockings help to prevent blood clots and reduce swelling in your legs.  You may have some cramping in your abdomen. This should go away after a few minutes. This information is not intended to replace advice given to you by your health care provider. Make sure you discuss any questions you have with your health care provider. Document Released:  12/12/2002 Document Revised: 09/03/2017 Document Reviewed: 04/27/2016 Elsevier Patient Education  [place] After This sheet gives you information about how to care for yourself after your procedure. Your health care provider may also give you more specific instructions. If you have problems or questions, contact your health care provider. What can I expect after the procedure? If you had a colposcopy without a biopsy, you can expect to feel fine right away, but you may have some spotting for a few days. You can go back to your normal activities. If you had a colposcopy with a biopsy, it is common to have:  Soreness and pain. This may last for a few days.  Light-headedness.  Mild vaginal bleeding or dark-colored, grainy discharge. This may last for a few days. The discharge may be due to a solution that was used during the procedure. You may need to wear a sanitary pad during this time.  Spotting for at least 48 hours after the procedure. Follow these instructions at home:   Take over-the-counter and prescription medicines only as told by your health care provider. Talk with your health care provider about what type of over-the-counter  pain medicine and prescription medicine you can start taking again. It is especially important to talk with your health care provider if you take blood-thinning medicine.  Do not drive or use heavy machinery while taking prescription pain medicine.  For at least 3 days after your procedure, or as long as told by your health care provider, avoid: ? Douching. ? Using tampons. ? Having sexual intercourse.  Continue to use birth control (contraception).  Limit your physical activity for the first day after the procedure as told by your health care provider. Ask your health care provider what activities are safe for you.  It is up to you to get the results of your procedure. Ask your health care provider, or the department  performing the procedure, when your results will be ready.  Keep all follow-up visits as told by your health care provider. This is important. Contact a health care provider if:  You develop a skin rash. Get help right away if:  You are bleeding heavily from your vagina or you are passing blood clots. This includes using more than one sanitary pad per hour for 2 hours in a row.  You have a fever or chills.  You have pelvic pain.  You have abnormal, yellow-colored, or bad-smelling vaginal discharge. This could be a sign of infection.  You have severe pain or cramps in your lower abdomen that do not get better with medicine.  You feel light-headed or dizzy, or you faint. Summary  If you had a colposcopy without a biopsy, you can expect to feel fine right away, but you may have some spotting for a few days. You can go back to your normal activities.  If you had a colposcopy with a biopsy, you may notice mild pain and spotting for 48 hours after the procedure.  Avoid douching, using tampons, and having sexual intercourse for 3 days after the procedure or as long as told by your health care provider.  Contact your health care provider if you have bleeding, severe pain, or signs of infection. This information is not intended to replace advice given to you by your health care provider. Make sure you discuss any questions you have with your health care provider. Document Released: 07/12/2013 Document Revised: 09/03/2017 Document Reviewed: 05/08/2016 Elsevier Patient Education  2020 [employer].

## 2019-08-08 LAB — SURGICAL PATHOLOGY

## 2019-08-24 ENCOUNTER — Telehealth: Payer: No Typology Code available for payment source | Admitting: Obstetrics & Gynecology

## 2019-10-30 MED FILL — LARIN FE 1.5-30 TABLET: 1.5-30 | 84 days supply | Qty: 84 | Fill #0

## 2020-01-16 ENCOUNTER — Encounter: Payer: Self-pay | Admitting: Family

## 2020-01-16 MED ORDER — CLOBETASOL PROPIONATE 0.05 % EX OINT: 1.0000 "application " | TOPICAL_OINTMENT | Freq: Two times a day (BID) | CUTANEOUS | 1 refills | Status: DC | PRN

## 2020-01-16 MED FILL — CLOBETASOL PROP 0.05% OINT: 0.05 | 30 days supply | Qty: 45 | Fill #0

## 2020-01-26 ENCOUNTER — Telehealth: Payer: Self-pay | Admitting: Family

## 2020-01-26 MED ORDER — VALACYCLOVIR HCL 1 G PO TABS: ORAL_TABLET | ORAL | 0 refills | Status: DC

## 2020-01-26 MED FILL — valACYclovir HCL 1 GM TABS: 1 | 3 days supply | Qty: 12 | Fill #0

## 2020-01-26 NOTE — Telephone Encounter (Signed)
Pt requesting rx for oral cold sore. Valtrex sent to pharmacy.

## 2020-03-20 MED FILL — LARIN FE 1.5-30 TABLET: 1.5-30 | 84 days supply | Qty: 84 | Fill #1

## 2020-05-14 ENCOUNTER — Encounter: Payer: Self-pay | Admitting: Family

## 2020-05-31 ENCOUNTER — Encounter: Payer: Self-pay | Admitting: Family

## 2020-05-31 ENCOUNTER — Other Ambulatory Visit: Payer: Self-pay

## 2020-05-31 ENCOUNTER — Ambulatory Visit (INDEPENDENT_AMBULATORY_CARE_PROVIDER_SITE_OTHER): Payer: No Typology Code available for payment source | Admitting: Family

## 2020-05-31 VITALS — BP 110/80 | HR 64 | Temp 98.0°F | Resp 16 | Ht 64.0 in | Wt 234.0 lb

## 2020-05-31 DIAGNOSIS — E538 Deficiency of other specified B group vitamins: Secondary | ICD-10-CM | POA: Diagnosis not present

## 2020-05-31 DIAGNOSIS — Z0000 Encounter for general adult medical examination without abnormal findings: Secondary | ICD-10-CM

## 2020-05-31 DIAGNOSIS — Z114 Encounter for screening for human immunodeficiency virus [HIV]: Secondary | ICD-10-CM

## 2020-05-31 DIAGNOSIS — Z1159 Encounter for screening for other viral diseases: Secondary | ICD-10-CM

## 2020-05-31 DIAGNOSIS — E559 Vitamin D deficiency, unspecified: Secondary | ICD-10-CM

## 2020-05-31 NOTE — Patient Instructions (Addendum)
Please complete lab work when you are able. Schedule mammogram on the first floor.    Preventive Care 29-46 Years Old, Female Preventive care refers to visits with your health care provider and lifestyle choices that can promote health and wellness. This includes:  A yearly physical exam. This may also be called an annual well check.  Regular dental visits and eye exams.  Immunizations.  Screening for certain conditions.  Healthy lifestyle choices, such as eating a healthy diet, getting regular exercise, not using drugs or products that contain nicotine and tobacco, and limiting alcohol use. What can I expect for my preventive care visit? Physical exam Your health care provider will check your:  Height and weight. This may be used to calculate body mass index (BMI), which tells if you are at a healthy weight.  Heart rate and blood pressure.  Skin for abnormal spots. Counseling Your health care provider may ask you questions about your:  Alcohol, tobacco, and drug use.  Emotional well-being.  Home and relationship well-being.  Sexual activity.  Eating habits.  Work and work Statistician.  Method of birth control.  Menstrual cycle.  Pregnancy history. What immunizations do I need?  Influenza (flu) vaccine  This is recommended every year. Tetanus, diphtheria, and pertussis (Tdap) vaccine  You may need a Td booster every 10 years. Varicella (chickenpox) vaccine  You may need this if you have not been vaccinated. Zoster (shingles) vaccine  You may need this after age 63. Measles, mumps, and rubella (MMR) vaccine  You may need at least one dose of MMR if you were born in 1957 or later. You may also need a second dose. Pneumococcal conjugate (PCV13) vaccine  You may need this if you have certain conditions and were not previously vaccinated. Pneumococcal polysaccharide (PPSV23) vaccine  You may need one or two doses if you smoke cigarettes or if you have  certain conditions. Meningococcal conjugate (MenACWY) vaccine  You may need this if you have certain conditions. Hepatitis A vaccine  You may need this if you have certain conditions or if you travel or work in places where you may be exposed to hepatitis A. Hepatitis B vaccine  You may need this if you have certain conditions or if you travel or work in places where you may be exposed to hepatitis B. Haemophilus influenzae type b (Hib) vaccine  You may need this if you have certain conditions. Human papillomavirus (HPV) vaccine  If recommended by your health care provider, you may need three doses over 6 months. You may receive vaccines as individual doses or as more than one vaccine together in one shot (combination vaccines). Talk with your health care provider about the risks and benefits of combination vaccines. What tests do I need? Blood tests  Lipid and cholesterol levels. These may be checked every 5 years, or more frequently if you are over 95 years old.  Hepatitis C test.  Hepatitis B test. Screening  Lung cancer screening. You may have this screening every year starting at age 79 if you have a 30-pack-year history of smoking and currently smoke or have quit within the past 15 years.  Colorectal cancer screening. All adults should have this screening starting at age 7 and continuing until age 51. Your health care provider may recommend screening at age 35 if you are at increased risk. You will have tests every 1-10 years, depending on your results and the type of screening test.  Diabetes screening. This is done by checking  your blood sugar (glucose) after you have not eaten for a while (fasting). You may have this done every 1-3 years.  Mammogram. This may be done every 1-2 years. Talk with your health care provider about when you should start having regular mammograms. This may depend on whether you have a family history of breast cancer.  BRCA-related cancer  screening. This may be done if you have a family history of breast, ovarian, tubal, or peritoneal cancers.  Pelvic exam and Pap test. This may be done every 3 years starting at age 47. Starting at age 35, this may be done every 5 years if you have a Pap test in combination with an HPV test. Other tests  Sexually transmitted disease (STD) testing.  Bone density scan. This is done to screen for osteoporosis. You may have this scan if you are at high risk for osteoporosis. Follow these instructions at home: Eating and drinking  Eat a diet that includes fresh fruits and vegetables, whole grains, lean protein, and low-fat dairy.  Take vitamin and mineral supplements as recommended by your health care provider.  Do not drink alcohol if: ? Your health care provider tells you not to drink. ? You are pregnant, may be pregnant, or are planning to become pregnant.  If you drink alcohol: ? Limit how much you have to 0-1 drink a day. ? Be aware of how much alcohol is in your drink. In the [place] one drink equals one 12 oz bottle of beer (355 mL), one 5 oz glass of wine (148 mL), or one 1 oz glass of hard liquor (44 mL). Lifestyle  Take daily care of your teeth and gums.  Stay active. Exercise for at least 30 minutes on 5 or more days each week.  Do not use any products that contain nicotine or tobacco, such as cigarettes, e-cigarettes, and chewing tobacco. If you need help quitting, ask your health care provider.  If you are sexually active, practice safe sex. Use a condom or other form of birth control (contraception) in order to prevent pregnancy and STIs (sexually transmitted infections).  If told by your health care provider, take low-dose aspirin daily starting at age 71. What's next?  Visit your health care provider once a year for a well check visit.  Ask your health care provider how often you should have your eyes and teeth checked.  Stay up to date on all vaccines. This  information is not intended to replace advice given to you by your health care provider. Make sure you discuss any questions you have with your health care provider. Document Revised: 06/02/2018 Document Reviewed: 06/02/2018 Elsevier Patient Education  2020 [employer].

## 2020-05-31 NOTE — Progress Notes (Signed)
Subjective:    Patient ID: Emily [name], female    DOB: 04/19/1976, 44 y.o.   MRN: [id number]  HPI  Patient is a 44 yr old female who presents today for cpx.  Immunizations: tdap 2017  Diet: trying to eat healthier. Wt Readings from Last 3 Encounters:  05/31/20 234 lb (106.1 kg)  08/07/19 231 lb (104.8 kg)  06/15/19 231 lb 1.3 oz (104.8 kg)  Exercise:  3 days a week Pap Smear: 06/15/19 (had CIN-I biopsy 11/20)- scheduled in 2 weeks Mammogram: 06/07/2019 Vision: up to date Dental: up to date  Review of Systems  Constitutional: Negative for unexpected weight change.  HENT: Negative for hearing loss and rhinorrhea.   Eyes: Negative for visual disturbance.  Respiratory: Negative for cough and shortness of breath.   Cardiovascular: Positive for leg swelling (with prolonged standing). Negative for chest pain.  Gastrointestinal: Negative for constipation and diarrhea.  Genitourinary: Negative for dysuria, frequency, hematuria and menstrual problem.  Musculoskeletal: Negative for arthralgias and myalgias.  Skin: Positive for rash (scalp psoriasis- sees dermatology).  Neurological: Negative for headaches.  Hematological: Negative for adenopathy.  Psychiatric/Behavioral:       Mild anxiety Denies depression   Past Medical History:  Diagnosis Date  . Allergy   . Anemia   . B12 deficiency   . Back pain   . Depression   . Gallbladder problem   . Joint pain   . Lactose intolerance   . Multiple food allergies   . Palpitations   . Psoriasis   . Soft tissue mass    left flank area  . Swelling   . Vitamin D deficiency      Social History   Socioeconomic History  . Marital status: Single    Spouse name: Not on file  . Number of children: Not on file  . Years of education: Not on file  . Highest education level: Not on file  Occupational History  . Occupation: Programmer, multimedia: [employer]  Tobacco Use  . Smoking status: Never Smoker  . Smokeless tobacco: Never Used   Substance and Sexual Activity  . Alcohol use: Yes    Comment: 2-3 glasses a week  . Drug use: No  . Sexual activity: Yes    Birth control/protection: Pill  Other Topics Concern  . Not on file  Social History Narrative   Works as a Engineer, maintenance at [employer]   No children   Not married   Has a dog named Dennard   From [place]- moved to [place] in 2002   Parents are both living   Has one sister back home   Enjoys going out to eat with friends   Enjoys biking and kayaking.    Goes to the [employer] 6 days a week   Social Determinants of Health   Financial Resource Strain:   . Difficulty of Paying Living Expenses: Not on file  Food Insecurity:   . Worried About Charity fundraiser in the Last Year: Not on file  . Ran Out of Food in the Last Year: Not on file  Transportation Needs:   . Lack of Transportation (Medical): Not on file  . Lack of Transportation (Non-Medical): Not on file  Physical Activity:   . Days of Exercise per Week: Not on file  . Minutes of Exercise per Session: Not on file  Stress:   . Feeling of Stress : Not on file  Social Connections:   .  Frequency of Communication with Friends and Family: Not on file  . Frequency of Social Gatherings with Friends and Family: Not on file  . Attends Religious Services: Not on file  . Active Member of Clubs or Organizations: Not on file  . Attends Archivist Meetings: Not on file  . Marital Status: Not on file  Intimate Partner Violence:   . Fear of Current or Ex-Partner: Not on file  . Emotionally Abused: Not on file  . Physically Abused: Not on file  . Sexually Abused: Not on file    Past Surgical History:  Procedure Laterality Date  . CHOLECYSTECTOMY  2006  . COLPOSCOPY  2008  . ENDOMETRIAL BIOPSY  2017   negative per pt  . GASTRIC BYPASS  2007  . MASS EXCISION Left 08/17/2017   Procedure: EXCISION SOFT TISSUE  MASS LEFT FLANK;  Surgeon: [name], MD;  Location: [facility];   Service: General;  Laterality: Left;    Family History  Problem Relation Age of Onset  . Hypertension Mother   . Obesity Mother   . Hypertension Father   . Sleep apnea Father   . Obesity Father   . Alzheimer's disease Maternal Grandmother   . Leukemia Maternal Grandfather   . Alzheimer's disease Paternal Grandmother   . Cancer Paternal Grandfather   . Diabetes type II Maternal Aunt   . Stroke Maternal Aunt     Allergies  Allergen Reactions  . Almond Oil Swelling  . Apple Swelling  . Kiwi Extract Swelling  . Soy Allergy Other (See Comments)  . Strawberry Extract Other (See Comments)    Current Outpatient Medications on File Prior to Visit  Medication Sig Dispense Refill  . clobetasol ointment (TEMOVATE) 4.74 % Apply 1 application topically 2 (two) times daily as needed. 45 g 1  . norethindrone-ethinyl estradiol-iron (MICROGESTIN FE 1.5/30) 1.5-30 MG-MCG tablet Take 1 tablet by mouth daily. 3 Package 4  . valACYclovir (VALTREX) 1000 MG tablet Take 2 tablet by mouth now and 2 tablets by mouth in 12 hours for cold sore 12 tablet 0   No current facility-administered medications on file prior to visit.    BP 110/80 (BP Location: Right Arm, Patient Position: Sitting, Cuff Size: Large)   Pulse 64   Temp 98 F (36.7 C) (Oral)   Resp 16   Ht 5\' 4"  (1.626 m)   Wt 234 lb (106.1 kg)   SpO2 98%   BMI 40.17 kg/m       Objective:   Physical Exam  Physical Exam  Constitutional: She is oriented to person, place, and time. She appears well-developed and well-nourished. No distress.  HENT:  Head: Normocephalic and atraumatic.  Right Ear: Tympanic membrane and ear canal normal.  Left Ear: Tympanic membrane and ear canal normal.  Mouth/Throat: Not examined- wearing mask Eyes: Pupils are equal, round, and reactive to light. No scleral icterus.  Neck: Normal range of motion. No thyromegaly present.  Cardiovascular: Normal rate and regular rhythm.   No murmur  heard. Pulmonary/Chest: Effort normal and breath sounds normal. No respiratory distress. He has no wheezes. She has no rales. She exhibits no tenderness.  Abdominal: Soft. Bowel sounds are normal. She exhibits no distension and no mass. There is no tenderness. There is no rebound and no guarding.  Musculoskeletal: She exhibits no edema.  Lymphadenopathy:    She has no cervical adenopathy.  Neurological: She is alert and oriented to person, place, and time. She has normal patellar  reflexes. She exhibits normal muscle tone. Coordination normal.  Skin: Skin is warm and dry.  Psychiatric: She has a normal mood and affect. Her behavior is normal. Judgment and thought content normal.  Breast/pelvis: deferred          Assessment & Plan:   Preventative care- due for mammogram. Obtain routine lab work as well as b12/folate/vit D due to hx of gastric bypass.  Pap scheduled with GYN.  Discussed healthy diet, exercise, weight loss.  This visit occurred during the SARS-CoV-2 public health emergency.  Safety protocols were in place, including screening questions prior to the visit, additional usage of staff PPE, and extensive cleaning of exam room while observing appropriate contact time as indicated for disinfecting solutions.         Assessment & Plan:

## 2020-06-03 ENCOUNTER — Ambulatory Visit (INDEPENDENT_AMBULATORY_CARE_PROVIDER_SITE_OTHER): Payer: No Typology Code available for payment source | Admitting: Family Medicine

## 2020-06-03 ENCOUNTER — Other Ambulatory Visit: Payer: Self-pay

## 2020-06-03 DIAGNOSIS — Z114 Encounter for screening for human immunodeficiency virus [HIV]: Secondary | ICD-10-CM

## 2020-06-03 DIAGNOSIS — Z1159 Encounter for screening for other viral diseases: Secondary | ICD-10-CM

## 2020-06-03 LAB — CBC WITH DIFFERENTIAL/PLATELET
Basophils Absolute: 0 10*3/uL (ref 0.0–0.1)
Basophils Relative: 0.6 % (ref 0.0–3.0)
Eosinophils Absolute: 0.2 10*3/uL (ref 0.0–0.7)
Eosinophils Relative: 3.2 % (ref 0.0–5.0)
HCT: 35.1 % — ABNORMAL LOW (ref 36.0–46.0)
Hemoglobin: 10.9 g/dL — ABNORMAL LOW (ref 12.0–15.0)
Lymphocytes Relative: 34.7 % (ref 12.0–46.0)
Lymphs Abs: 1.8 10*3/uL (ref 0.7–4.0)
MCHC: 31.1 g/dL (ref 30.0–36.0)
MCV: 75.5 fl — ABNORMAL LOW (ref 78.0–100.0)
Monocytes Absolute: 0.3 10*3/uL (ref 0.1–1.0)
Monocytes Relative: 5.3 % (ref 3.0–12.0)
Neutro Abs: 2.9 10*3/uL (ref 1.4–7.7)
Neutrophils Relative %: 56.2 % (ref 43.0–77.0)
Platelets: 390 10*3/uL (ref 150.0–400.0)
RBC: 4.65 Mil/uL (ref 3.87–5.11)
RDW: 16.7 % — ABNORMAL HIGH (ref 11.5–15.5)
WBC: 5.1 10*3/uL (ref 4.0–10.5)

## 2020-06-03 LAB — BASIC METABOLIC PANEL
BUN: 15 mg/dL (ref 6–23)
CO2: 24 mEq/L (ref 19–32)
Calcium: 8.8 mg/dL (ref 8.4–10.5)
Chloride: 104 mEq/L (ref 96–112)
Creatinine, Ser: 0.63 mg/dL (ref 0.40–1.20)
GFR: 101.53 mL/min (ref >60.00)
Glucose, Bld: 84 mg/dL (ref 70–99)
Potassium: 4.2 mEq/L (ref 3.5–5.1)
Sodium: 138 mEq/L (ref 135–145)

## 2020-06-03 LAB — HEPATIC FUNCTION PANEL
ALT: 20 U/L (ref 0–35)
AST: 29 U/L (ref 0–37)
Albumin: 3.8 g/dL (ref 3.5–5.2)
Alkaline Phosphatase: 81 U/L (ref 39–117)
Bilirubin, Direct: 0.2 mg/dL (ref 0.0–0.3)
Total Bilirubin: 1.1 mg/dL (ref 0.2–1.2)
Total Protein: 6.6 g/dL (ref 6.0–8.3)

## 2020-06-03 LAB — LIPID PANEL
Cholesterol: 229 mg/dL — ABNORMAL HIGH (ref 0–200)
HDL: 129.8 mg/dL (ref >39.00)
LDL Cholesterol: 87 mg/dL (ref 0–99)
NonHDL: 99.41
Total CHOL/HDL Ratio: 2
Triglycerides: 64 mg/dL (ref 0.0–149.0)
VLDL: 12.8 mg/dL (ref 0.0–40.0)

## 2020-06-03 LAB — B12 AND FOLATE PANEL
Folate: 5.6 ng/mL — ABNORMAL LOW (ref >5.9)
Vitamin B-12: 143 pg/mL — ABNORMAL LOW (ref 211–911)

## 2020-06-03 LAB — TSH: TSH: 2.65 u[IU]/mL (ref 0.35–4.50)

## 2020-06-04 LAB — HEPATITIS C ANTIBODY
Hepatitis C Ab: NONREACTIVE
SIGNAL TO CUT-OFF: 0 (ref <1.00)

## 2020-06-04 LAB — HIV ANTIBODY (ROUTINE TESTING W REFLEX): HIV 1&2 Ab, 4th Generation: NONREACTIVE

## 2020-06-06 ENCOUNTER — Telehealth: Payer: Self-pay | Admitting: Family

## 2020-06-06 ENCOUNTER — Other Ambulatory Visit: Payer: Self-pay

## 2020-06-06 ENCOUNTER — Ambulatory Visit (INDEPENDENT_AMBULATORY_CARE_PROVIDER_SITE_OTHER): Payer: No Typology Code available for payment source

## 2020-06-06 DIAGNOSIS — E538 Deficiency of other specified B group vitamins: Secondary | ICD-10-CM | POA: Diagnosis not present

## 2020-06-06 MED ORDER — CYANOCOBALAMIN 1000 MCG/ML IJ SOLN
1000.0000 ug | Freq: Once | INTRAMUSCULAR | Status: AC
  Administered 2020-06-06: 1000 ug via INTRAMUSCULAR

## 2020-06-06 NOTE — Telephone Encounter (Signed)
Please contact pt to schedule nurse visit for b12 shots. See mychart message.

## 2020-06-06 NOTE — Progress Notes (Signed)
Reviewed and agree.  [name]

## 2020-06-06 NOTE — Progress Notes (Signed)
Pt here for weekly B12 injection per [name]  B12 1065mcg given IM eft deltoid, and pt tolerated injection well.  Next B12 injection scheduled for next week.

## 2020-06-06 NOTE — Telephone Encounter (Signed)
Patient has reviewed message and is scheduled for first B12 shot today

## 2020-06-07 LAB — VITAMIN D 1,25 DIHYDROXY
Vitamin D 1, 25 (OH)2 Total: 71 pg/mL (ref 18–72)
Vitamin D2 1, 25 (OH)2: <8 pg/mL
Vitamin D3 1, 25 (OH)2: 71 pg/mL

## 2020-06-14 ENCOUNTER — Ambulatory Visit (INDEPENDENT_AMBULATORY_CARE_PROVIDER_SITE_OTHER): Payer: No Typology Code available for payment source

## 2020-06-14 ENCOUNTER — Other Ambulatory Visit: Payer: Self-pay

## 2020-06-14 DIAGNOSIS — E538 Deficiency of other specified B group vitamins: Secondary | ICD-10-CM

## 2020-06-14 MED ORDER — CYANOCOBALAMIN 1000 MCG/ML IJ SOLN
1000.0000 ug | Freq: Once | INTRAMUSCULAR | Status: AC
  Administered 2020-06-14: 1000 ug via INTRAMUSCULAR

## 2020-06-14 NOTE — Progress Notes (Signed)
Pt here for weekly B12 injection per Melissa  B12 1074mcg given IM in left deltoid, and pt tolerated injection well.

## 2020-06-20 ENCOUNTER — Other Ambulatory Visit: Payer: Self-pay

## 2020-06-20 ENCOUNTER — Ambulatory Visit (INDEPENDENT_AMBULATORY_CARE_PROVIDER_SITE_OTHER): Payer: No Typology Code available for payment source | Admitting: Obstetrics & Gynecology

## 2020-06-20 ENCOUNTER — Encounter: Payer: Self-pay | Admitting: Obstetrics & Gynecology

## 2020-06-20 ENCOUNTER — Ambulatory Visit (HOSPITAL_BASED_OUTPATIENT_CLINIC_OR_DEPARTMENT_OTHER)
Admission: RE | Admit: 2020-06-20 | Discharge: 2020-06-20 | Disposition: A | Discharge status: Home / Self Care | Payer: No Typology Code available for payment source | Source: Ambulatory Visit | Attending: Family | Admitting: Family

## 2020-06-20 ENCOUNTER — Other Ambulatory Visit (HOSPITAL_COMMUNITY)
Admission: RE | Admit: 2020-06-20 | Discharge: 2020-06-20 | Disposition: A | Discharge status: Home / Self Care | Payer: No Typology Code available for payment source | Source: Ambulatory Visit | Attending: Obstetrics & Gynecology | Admitting: Obstetrics & Gynecology

## 2020-06-20 ENCOUNTER — Ambulatory Visit (INDEPENDENT_AMBULATORY_CARE_PROVIDER_SITE_OTHER): Payer: No Typology Code available for payment source

## 2020-06-20 VITALS — BP 125/67 | HR 62 | Ht 64.0 in | Wt 233.0 lb

## 2020-06-20 DIAGNOSIS — Z01419 Encounter for gynecological examination (general) (routine) without abnormal findings: Secondary | ICD-10-CM | POA: Diagnosis not present

## 2020-06-20 DIAGNOSIS — Z3009 Encounter for other general counseling and advice on contraception: Secondary | ICD-10-CM

## 2020-06-20 DIAGNOSIS — Z3041 Encounter for surveillance of contraceptive pills: Secondary | ICD-10-CM | POA: Diagnosis not present

## 2020-06-20 DIAGNOSIS — Z0000 Encounter for general adult medical examination without abnormal findings: Secondary | ICD-10-CM

## 2020-06-20 DIAGNOSIS — E538 Deficiency of other specified B group vitamins: Secondary | ICD-10-CM | POA: Diagnosis not present

## 2020-06-20 DIAGNOSIS — Z1231 Encounter for screening mammogram for malignant neoplasm of breast: Secondary | ICD-10-CM | POA: Diagnosis not present

## 2020-06-20 DIAGNOSIS — R87612 Low grade squamous intraepithelial lesion on cytologic smear of cervix (LGSIL): Secondary | ICD-10-CM

## 2020-06-20 DIAGNOSIS — Z1211 Encounter for screening for malignant neoplasm of colon: Secondary | ICD-10-CM

## 2020-06-20 MED ORDER — CYANOCOBALAMIN 1000 MCG/ML IJ SOLN
1000.0000 ug | Freq: Once | INTRAMUSCULAR | Status: AC
  Administered 2020-06-20: 1000 ug via INTRAMUSCULAR

## 2020-06-20 MED ORDER — NORETHIN ACE-ETH ESTRAD-FE 1.5-30 MG-MCG PO TABS
1.0000 | ORAL_TABLET | Freq: Every day | ORAL | 4 refills | Status: DC
  Filled 2021-02-14: qty 84, 84d supply, fill #0
  Filled 2021-05-15: qty 84, 84d supply, fill #1

## 2020-06-20 MED FILL — LARIN FE 1.5-30 TABLET: 1.5-30 | 84 days supply | Qty: 84 | Fill #0

## 2020-06-20 NOTE — Progress Notes (Signed)
Emily [name] is a 44 y.o. female presents to the office today for cyanocobalamin injection, per physician's orders. Original order: 06-06-20. B12  was administered IM (location) today. Patient tolerated injection.  Patient next injection due:in one week for #4 of 4 weekly, appt made for 06-28-20.  Jiles Prows

## 2020-06-20 NOTE — Progress Notes (Signed)
Last pap: 06/15/2019 - LSIL Mamo today at 11

## 2020-06-20 NOTE — Progress Notes (Signed)
Reviewed and agree.   [name]

## 2020-06-20 NOTE — Progress Notes (Signed)
Subjective:     Emily [name] is a 44 y.o. female here for a routine exam.  Current complaints: none. Pt takes continuous OCPs with no break. She started her menses today. She is sexually active. On OCPs with occ condom use. Drinks ~4 drinks per day on the weekends with friends. Recognizes that this is a large amount. Has not had a colonoscopy. No FH of colon cancer.   No FH of any female cancers.     Gynecologic History Patient's last menstrual period was 06/20/2020 (approximate). Contraception: condoms and OCP (estrogen/progesterone) Last Pap: 06/15/2019. Results were: abnormal LGSIL  Last mammogram: 06/07/2019. Results were: normal  Obstetric History OB History  Gravida Para Term Preterm AB Living  0 0 0 0 0 0  SAB TAB Ectopic Multiple Live Births  0 0 0 0 0     The following portions of the patient's history were reviewed and updated as appropriate: allergies, current medications, past family history, past medical history, past social history, past surgical history and problem list.  Review of Systems Pertinent items are noted in HPI.    Objective:  BP 125/67   Pulse 62   Ht 5\' 4"  (1.626 m)   Wt 233 lb 0.6 oz (105.7 kg)   LMP 06/20/2020 (Approximate)   BMI 40.00 kg/m  General Appearance:    Alert, cooperative, no distress, appears stated age  Head:    Normocephalic, without obvious abnormality, atraumatic  Eyes:    conjunctiva/corneas clear, EOM's intact, both eyes  Ears:    Normal external ear canals, both ears  Nose:   Nares normal, septum midline, mucosa normal, no drainage    or sinus tenderness  Throat:   Lips, mucosa, and tongue normal; teeth and gums normal  Neck:   Supple, symmetrical, trachea midline, no adenopathy;    thyroid:  no enlargement/tenderness/nodules  Back:     Symmetric, no curvature, ROM normal, no CVA tenderness  Lungs:     respirations unlabored  Chest Wall:    No tenderness or deformity   Heart:    Regular rate and rhythm  Breast Exam:    No  tenderness, masses, or nipple abnormality  Abdomen:     Soft, non-tender, bowel sounds active all four quadrants,    no masses, no organomegaly  Genitalia:    Normal female without lesion, discharge or tenderness   Small uterus. + blood in vault.    Extremities:   Extremities normal, atraumatic, no cyanosis or edema  Pulses:   2+ and symmetric all extremities  Skin:   Skin color, texture, turgor normal, no rashes or lesions    Assessment:    Healthy female exam.   Contraception counseling- wants to cont OCPs Colon cancer screen- discussed colonoscopy vs cologard. Pt will opt for colonoscopy       Breast cancer screening H/o LGSIL PAP     Plan:   F/u PAP and hrHPV screen Mammogram today  Rec decreased ETOH intake Referral for colonoscopy Refilled OCPs  Mylon Mabey L. Harraway-Smith, M.D., Cherlynn June

## 2020-06-26 LAB — CYTOLOGY - PAP
Comment: NEGATIVE
Comment: NEGATIVE
Diagnosis: NEGATIVE
HPV 16: NEGATIVE
HPV 18 / 45: NEGATIVE
High risk HPV: POSITIVE — AB

## 2020-06-28 ENCOUNTER — Other Ambulatory Visit: Payer: Self-pay

## 2020-06-28 ENCOUNTER — Ambulatory Visit (INDEPENDENT_AMBULATORY_CARE_PROVIDER_SITE_OTHER): Payer: No Typology Code available for payment source

## 2020-06-28 DIAGNOSIS — E538 Deficiency of other specified B group vitamins: Secondary | ICD-10-CM | POA: Diagnosis not present

## 2020-06-28 MED ORDER — CYANOCOBALAMIN 1000 MCG/ML IJ SOLN
1000.0000 ug | INTRAMUSCULAR | Status: DC
  Administered 2020-06-28: 1000 ug via INTRAMUSCULAR

## 2020-06-28 NOTE — Progress Notes (Signed)
Pt here for weekly B12 injection per Melissa  B12 1044mcg given IM in left deltoid, and pt tolerated injection well.  Pt scheduled monthly injection for July 31, 2020

## 2020-07-31 ENCOUNTER — Ambulatory Visit (INDEPENDENT_AMBULATORY_CARE_PROVIDER_SITE_OTHER): Payer: No Typology Code available for payment source

## 2020-07-31 ENCOUNTER — Other Ambulatory Visit: Payer: Self-pay

## 2020-07-31 DIAGNOSIS — E538 Deficiency of other specified B group vitamins: Secondary | ICD-10-CM

## 2020-07-31 MED ORDER — CYANOCOBALAMIN 1000 MCG/ML IJ SOLN
1000.0000 ug | Freq: Once | INTRAMUSCULAR | Status: AC
  Administered 2020-07-31: 1000 ug via INTRAMUSCULAR

## 2020-07-31 NOTE — Progress Notes (Signed)
Pt here for monthly B12 injection per [name]  B12 1058mcg given IM in right deltoid, and pt tolerated injection well.

## 2020-08-02 ENCOUNTER — Other Ambulatory Visit (HOSPITAL_BASED_OUTPATIENT_CLINIC_OR_DEPARTMENT_OTHER): Payer: Self-pay | Admitting: Internal Medicine

## 2020-08-02 ENCOUNTER — Ambulatory Visit: Payer: No Typology Code available for payment source | Attending: Internal Medicine

## 2020-08-02 DIAGNOSIS — Z23 Encounter for immunization: Secondary | ICD-10-CM

## 2020-09-03 ENCOUNTER — Other Ambulatory Visit: Payer: Self-pay

## 2020-09-03 ENCOUNTER — Ambulatory Visit (INDEPENDENT_AMBULATORY_CARE_PROVIDER_SITE_OTHER): Payer: No Typology Code available for payment source

## 2020-09-03 DIAGNOSIS — E538 Deficiency of other specified B group vitamins: Secondary | ICD-10-CM | POA: Diagnosis not present

## 2020-09-03 MED ORDER — CYANOCOBALAMIN 1000 MCG/ML IJ SOLN
1000.0000 ug | Freq: Once | INTRAMUSCULAR | Status: AC
  Administered 2020-09-03: 1000 ug via INTRAMUSCULAR

## 2020-09-03 NOTE — Progress Notes (Signed)
Pt here for monthly B12 injection per  [name]  B12 1074mcg given IM left deltoid, and pt tolerated injection well.  Next B12 injection scheduled for Next month.

## 2020-09-24 ENCOUNTER — Encounter: Payer: Self-pay | Admitting: Family

## 2020-09-24 ENCOUNTER — Other Ambulatory Visit (HOSPITAL_BASED_OUTPATIENT_CLINIC_OR_DEPARTMENT_OTHER): Payer: Self-pay | Admitting: Family

## 2020-09-24 MED ORDER — CYANOCOBALAMIN 1000 MCG/ML IJ SOLN: 1000.0000 ug | INTRAMUSCULAR | 4 refills | Status: DC

## 2020-09-24 MED ORDER — CLOBETASOL PROPIONATE 0.05 % EX SOLN: 1.0000 "application " | Freq: Two times a day (BID) | CUTANEOUS | 3 refills | Status: DC

## 2020-09-24 MED ORDER — "NEEDLE (DISP) 23G X 1-1/2"" MISC": 1 refills | Status: DC

## 2020-09-24 MED ORDER — VALACYCLOVIR HCL 1 G PO TABS: ORAL_TABLET | ORAL | 4 refills | Status: DC

## 2020-09-24 MED FILL — CYANOCOBALAMIN 1,000 MCG/ML: 1000 | 90 days supply | Qty: 3 | Fill #0

## 2020-09-24 MED FILL — CLOBETASOL PROPIONATE 0.05: 0.05 | 30 days supply | Qty: 50 | Fill #0

## 2020-09-24 MED FILL — BD 3 ML SYRINGE WITH NEEDLE: 23G X 1-1/2 | 30 days supply | Qty: 12 | Fill #0

## 2020-09-24 MED FILL — valACYclovir HCL 1 GM TABS: 1 | 3 days supply | Qty: 12 | Fill #0

## 2020-11-11 ENCOUNTER — Encounter: Payer: Self-pay | Admitting: Family

## 2020-11-20 ENCOUNTER — Ambulatory Visit (INDEPENDENT_AMBULATORY_CARE_PROVIDER_SITE_OTHER): Payer: No Typology Code available for payment source | Admitting: Family

## 2020-11-20 ENCOUNTER — Other Ambulatory Visit: Payer: Self-pay | Admitting: Family

## 2020-11-20 ENCOUNTER — Other Ambulatory Visit: Payer: Self-pay

## 2020-11-20 ENCOUNTER — Encounter: Payer: Self-pay | Admitting: Family

## 2020-11-20 VITALS — BP 110/76 | HR 67 | Resp 17 | Ht 64.0 in | Wt 242.0 lb

## 2020-11-20 DIAGNOSIS — R454 Irritability and anger: Secondary | ICD-10-CM

## 2020-11-20 MED ORDER — BUPROPION HCL ER (XL) 150 MG PO TB24: 150.0000 mg | ORAL_TABLET | Freq: Every day | ORAL | 1 refills | Status: DC

## 2020-11-20 MED FILL — buPROPion HCL ER (XL) 150 M: 150 | 30 days supply | Qty: 30 | Fill #0

## 2020-11-20 NOTE — Progress Notes (Signed)
Subjective:    Patient ID: Emily [name], female    DOB: [birth year], 45 y.o.   MRN: [id number]  HPI  Patient is a 45 yr old female who presents today to discuss depression. Reports that she is extremely irritable towards others.  She describes herself as short tempered, snappy and irritable.    Review of Systems See HPI  Past Medical History:  Diagnosis Date  . Allergy   . Anemia   . B12 deficiency   . Back pain   . Depression   . Gallbladder problem   . Joint pain   . Lactose intolerance   . Multiple food allergies   . Palpitations   . Psoriasis   . Soft tissue mass    left flank area  . Swelling   . Vitamin D deficiency      Social History   Socioeconomic History  . Marital status: Single    Spouse name: Not on file  . Number of children: Not on file  . Years of education: Not on file  . Highest education level: Not on file  Occupational History  . Occupation: Programmer, multimedia: [employer]  Tobacco Use  . Smoking status: Never Smoker  . Smokeless tobacco: Never Used  Substance and Sexual Activity  . Alcohol use: Yes    Comment: 2-3 glasses a week  . Drug use: No  . Sexual activity: Yes    Birth control/protection: Pill  Other Topics Concern  . Not on file  Social History Narrative   Works as a Engineer, maintenance at [employer]   No children   Not married   Has a dog named L[place]   From [place]- moved to [place] in 2002   Parents are both living   Has one sister back home   Enjoys going out to eat with friends   Enjoys biking and kayaking.    Goes to the GYM 6 days a week   Social Determinants of Radio broadcast assistant Strain: Not on file  Food Insecurity: Not on file  Transportation Needs: Not on file  Physical Activity: Not on file  Stress: Not on file  Social Connections: Not on file  Intimate Partner Violence: Not on file    Past Surgical History:  Procedure Laterality Date  . CHOLECYSTECTOMY  2006  . COLPOSCOPY  2008  .  ENDOMETRIAL BIOPSY  2017   negative per pt  . GASTRIC BYPASS  2007  . MASS EXCISION Left 08/17/2017   Procedure: EXCISION SOFT TISSUE  MASS LEFT FLANK;  Surgeon: [name], MD;  Location: [facility];  Service: General;  Laterality: Left;    Family History  Problem Relation Age of Onset  . Hypertension Mother   . Obesity Mother   . Hypertension Father   . Sleep apnea Father   . Obesity Father   . Alzheimer's disease Maternal Grandmother   . Leukemia Maternal Grandfather   . Alzheimer's disease Paternal Grandmother   . Cancer Paternal Grandfather   . Diabetes type II Maternal Aunt   . Stroke Maternal Aunt     Allergies  Allergen Reactions  . Almond Oil Swelling  . Apple Swelling  . Kiwi Extract Swelling  . Soy Allergy Other (See Comments)  . Strawberry Extract Other (See Comments)    Current Outpatient Medications on File Prior to Visit  Medication Sig Dispense Refill  . clobetasol (TEMOVATE) 0.05 % external solution Apply 1 application  topically 2 (two) times daily. 50 mL 3  . cyanocobalamin (,VITAMIN B-12,) 1000 MCG/ML injection Inject 1 mL (1,000 mcg total) into the muscle every 30 (thirty) days. 12 mL 4  . folic acid (FOLVITE) 1 MG tablet Take 1 mg by mouth daily.    Marland Kitchen NEEDLE, DISP, 23 G 23G X 1-1/2" MISC Use as directed 12 each 1  . norethindrone-ethinyl estradiol-iron (MICROGESTIN FE 1.5/30) 1.5-30 MG-MCG tablet Take 1 tablet by mouth daily. 84 tablet 4  . valACYclovir (VALTREX) 1000 MG tablet Take 2 tablet by mouth now and 2 tablets by mouth in 12 hours for cold sore 12 tablet 4   No current facility-administered medications on file prior to visit.    BP 110/76 (BP Location: Left Arm, Patient Position: Sitting, Cuff Size: Large)   Pulse 67   Resp 17   Ht 5\' 4"  (1.626 m)   Wt 242 lb (109.8 kg)   SpO2 99%   BMI 41.54 kg/m       Objective:   Physical Exam Constitutional:      Appearance: Normal appearance.  Neurological:     Mental  Status: She is alert.  Psychiatric:        Attention and Perception: Attention and perception normal.        Mood and Affect: Mood and affect normal.        Speech: Speech normal.        Behavior: Behavior normal.           Assessment & Plan:  Irritability- likely related to depression.  Will initiate wellbutrin xl 150mg  once daily an plan to bring her back in 6 weeks for follow up.  This visit occurred during the SARS-CoV-2 public health emergency.  Safety protocols were in place, including screening questions prior to the visit, additional usage of staff PPE, and extensive cleaning of exam room while observing appropriate contact time as indicated for disinfecting solutions.

## 2020-12-24 ENCOUNTER — Other Ambulatory Visit: Payer: Self-pay | Admitting: Family

## 2020-12-24 ENCOUNTER — Ambulatory Visit (INDEPENDENT_AMBULATORY_CARE_PROVIDER_SITE_OTHER): Payer: No Typology Code available for payment source | Admitting: Family

## 2020-12-24 ENCOUNTER — Telehealth: Payer: Self-pay | Admitting: Family

## 2020-12-24 ENCOUNTER — Other Ambulatory Visit: Payer: Self-pay

## 2020-12-24 ENCOUNTER — Encounter: Payer: Self-pay | Admitting: Family

## 2020-12-24 VITALS — BP 124/65 | HR 77 | Temp 98.3°F | Resp 16 | Ht 64.0 in | Wt 230.0 lb

## 2020-12-24 DIAGNOSIS — F32A Depression, unspecified: Secondary | ICD-10-CM

## 2020-12-24 DIAGNOSIS — D649 Anemia, unspecified: Secondary | ICD-10-CM

## 2020-12-24 DIAGNOSIS — R002 Palpitations: Secondary | ICD-10-CM | POA: Diagnosis not present

## 2020-12-24 LAB — BASIC METABOLIC PANEL
BUN: 21 mg/dL (ref 6–23)
CO2: 27 mEq/L (ref 19–32)
Calcium: 9.3 mg/dL (ref 8.4–10.5)
Chloride: 101 mEq/L (ref 96–112)
Creatinine, Ser: 0.7 mg/dL (ref 0.40–1.20)
GFR: 103.29 mL/min (ref >60.00)
Glucose, Bld: 91 mg/dL (ref 70–99)
Potassium: 4.5 mEq/L (ref 3.5–5.1)
Sodium: 138 mEq/L (ref 135–145)

## 2020-12-24 LAB — CBC WITH DIFFERENTIAL/PLATELET
Basophils Absolute: 0 10*3/uL (ref 0.0–0.1)
Basophils Relative: 0.8 % (ref 0.0–3.0)
Eosinophils Absolute: 0.1 10*3/uL (ref 0.0–0.7)
Eosinophils Relative: 2 % (ref 0.0–5.0)
HCT: 35.8 % — ABNORMAL LOW (ref 36.0–46.0)
Hemoglobin: 11.2 g/dL — ABNORMAL LOW (ref 12.0–15.0)
Lymphocytes Relative: 31.6 % (ref 12.0–46.0)
Lymphs Abs: 1.8 10*3/uL (ref 0.7–4.0)
MCHC: 31.3 g/dL (ref 30.0–36.0)
MCV: 69.2 fl — ABNORMAL LOW (ref 78.0–100.0)
Monocytes Absolute: 0.3 10*3/uL (ref 0.1–1.0)
Monocytes Relative: 6 % (ref 3.0–12.0)
Neutro Abs: 3.4 10*3/uL (ref 1.4–7.7)
Neutrophils Relative %: 59.6 % (ref 43.0–77.0)
Platelets: 413 10*3/uL — ABNORMAL HIGH (ref 150.0–400.0)
RBC: 5.17 Mil/uL — ABNORMAL HIGH (ref 3.87–5.11)
RDW: 16.2 % — ABNORMAL HIGH (ref 11.5–15.5)
WBC: 5.8 10*3/uL (ref 4.0–10.5)

## 2020-12-24 LAB — TSH: TSH: 2.37 u[IU]/mL (ref 0.35–4.50)

## 2020-12-24 MED ORDER — BUPROPION HCL ER (XL) 150 MG PO TB24: 150.0000 mg | ORAL_TABLET | Freq: Every day | ORAL | 1 refills | Status: DC

## 2020-12-24 MED ORDER — IRON 325 (65 FE) MG PO TABS: 1.0000 | ORAL_TABLET | ORAL | 0 refills | Status: DC

## 2020-12-24 NOTE — Telephone Encounter (Signed)
Advised pt to take iron 325mg  QOD and repeat CBC in 3 months.

## 2020-12-24 NOTE — Progress Notes (Signed)
Subjective:    Patient ID: Emily [name], female    DOB: [birth year], 45 y.o.   MRN: [id number]  HPI   Patient is a 45 yr old female who presents today with chief complaint of palpitations. Reports that it occurred this AM in the shower. She noted HR in the 160's, irregular pulse.  Lasted about 30 minutes, was worse with standing up.  Had associated SOB.     Wt Readings from Last 3 Encounters:  12/24/20 230 lb (104.3 kg)  11/20/20 242 lb (109.8 kg)  06/20/20 233 lb 0.6 oz (105.7 kg)   Depression- she reports that her mood is much better since she started wellbutrin.  No longer feeling irritable. Notes that the wellbutrin also curbs her appetite which she is pleased about. Additionally, she notes that since starting wellbutrin, she notices that alcohol makes her nauseous. As a result, she has drastically cut back on her alcohol consumption.  She has not had a drink in 15 days. She is also doing the [employer] plan and has lost 12 pounds in the last 3 weeks.     Review of Systems Past Medical History:  Diagnosis Date  . Allergy   . Anemia   . B12 deficiency   . Back pain   . Depression   . Gallbladder problem   . Joint pain   . Lactose intolerance   . Multiple food allergies   . Palpitations   . Psoriasis   . Soft tissue mass    left flank area  . Swelling   . Vitamin D deficiency      Social History   Socioeconomic History  . Marital status: Single    Spouse name: Not on file  . Number of children: Not on file  . Years of education: Not on file  . Highest education level: Not on file  Occupational History  . Occupation: Programmer, multimedia: [employer]  Tobacco Use  . Smoking status: Never Smoker  . Smokeless tobacco: Never Used  Substance and Sexual Activity  . Alcohol use: Yes    Comment: 2-3 glasses a week  . Drug use: No  . Sexual activity: Yes    Birth control/protection: Pill  Other Topics Concern  . Not on file  Social History Narrative   Works as a  Engineer, maintenance at [employer]   No children   Not married   Has a dog named [place]   From [place]- moved to [place] in 2002   Parents are both living   Has one sister back home   Enjoys going out to eat with friends   Enjoys biking and kayaking.    Goes to the GYM 6 days a week   Social Determinants of Radio broadcast assistant Strain: Not on file  Food Insecurity: Not on file  Transportation Needs: Not on file  Physical Activity: Not on file  Stress: Not on file  Social Connections: Not on file  Intimate Partner Violence: Not on file    Past Surgical History:  Procedure Laterality Date  . CHOLECYSTECTOMY  2006  . COLPOSCOPY  2008  . ENDOMETRIAL BIOPSY  2017   negative per pt  . GASTRIC BYPASS  2007  . MASS EXCISION Left 08/17/2017   Procedure: EXCISION SOFT TISSUE  MASS LEFT FLANK;  Surgeon: [name], MD;  Location: [facility];  Service: General;  Laterality: Left;    Family History  Problem Relation  Age of Onset  . Hypertension Mother   . Obesity Mother   . Hypertension Father   . Sleep apnea Father   . Obesity Father   . Alzheimer's disease Maternal Grandmother   . Leukemia Maternal Grandfather   . Alzheimer's disease Paternal Grandmother   . Cancer Paternal Grandfather   . Diabetes type II Maternal Aunt   . Stroke Maternal Aunt     Allergies  Allergen Reactions  . Almond Oil Swelling  . Apple Swelling  . Kiwi Extract Swelling  . Soy Allergy Other (See Comments)  . Strawberry Extract Other (See Comments)    Current Outpatient Medications on File Prior to Visit  Medication Sig Dispense Refill  . buPROPion (WELLBUTRIN XL) 150 MG 24 hr tablet Take 1 tablet (150 mg total) by mouth daily. 30 tablet 1  . clobetasol (TEMOVATE) 0.05 % external solution Apply 1 application topically 2 (two) times daily. 50 mL 3  . cyanocobalamin (,VITAMIN B-12,) 1000 MCG/ML injection Inject 1 mL (1,000 mcg total) into the muscle every 30 (thirty) days. 12  mL 4  . folic acid (FOLVITE) 1 MG tablet Take 1 mg by mouth daily.    Marland Kitchen NEEDLE, DISP, 23 G 23G X 1-1/2" MISC Use as directed 12 each 1  . norethindrone-ethinyl estradiol-iron (MICROGESTIN FE 1.5/30) 1.5-30 MG-MCG tablet Take 1 tablet by mouth daily. 84 tablet 4  . valACYclovir (VALTREX) 1000 MG tablet Take 2 tablet by mouth now and 2 tablets by mouth in 12 hours for cold sore 12 tablet 4   No current facility-administered medications on file prior to visit.    BP 124/65 (BP Location: Left Arm, Patient Position: Sitting, Cuff Size: Large)   Pulse 77   Temp 98.3 F (36.8 C) (Oral)   Resp 16   Ht 5\' 4"  (1.626 m)   Wt 230 lb (104.3 kg)   SpO2 100%   BMI 39.48 kg/m       Objective:   Physical Exam Constitutional:      Appearance: She is well-developed.  Cardiovascular:     Rate and Rhythm: Normal rate and regular rhythm.     Heart sounds: Normal heart sounds. No murmur heard.   Pulmonary:     Effort: Pulmonary effort is normal. No respiratory distress.     Breath sounds: Normal breath sounds. No wheezing.  Psychiatric:        Behavior: Behavior normal.        Thought Content: Thought content normal.        Judgment: Judgment normal.           Assessment & Plan:  Palpitations-New. hx is concerning for PAF or possibly SVT.  EKG tracing is personally reviewed.  EKG notes NSR.  No acute changes. Will obtain bmet, TSH, CBC and plan referral to cardiology for further evaluation.  Depression- much better on wellbutrin, will continue same.   This visit occurred during the SARS-CoV-2 public health emergency.  Safety protocols were in place, including screening questions prior to the visit, additional usage of staff PPE, and extensive cleaning of exam room while observing appropriate contact time as indicated for disinfecting solutions.

## 2020-12-31 ENCOUNTER — Ambulatory Visit: Payer: No Typology Code available for payment source | Admitting: Family

## 2021-01-07 DIAGNOSIS — K829 Disease of gallbladder, unspecified: Secondary | ICD-10-CM | POA: Insufficient documentation

## 2021-01-07 DIAGNOSIS — E739 Lactose intolerance, unspecified: Secondary | ICD-10-CM | POA: Insufficient documentation

## 2021-01-07 DIAGNOSIS — R609 Edema, unspecified: Secondary | ICD-10-CM | POA: Insufficient documentation

## 2021-01-07 DIAGNOSIS — D649 Anemia, unspecified: Secondary | ICD-10-CM | POA: Insufficient documentation

## 2021-01-07 DIAGNOSIS — T7840XA Allergy, unspecified, initial encounter: Secondary | ICD-10-CM | POA: Insufficient documentation

## 2021-01-07 DIAGNOSIS — M549 Dorsalgia, unspecified: Secondary | ICD-10-CM | POA: Insufficient documentation

## 2021-01-07 DIAGNOSIS — M2550 Pain in unspecified joint: Secondary | ICD-10-CM | POA: Insufficient documentation

## 2021-01-07 DIAGNOSIS — R002 Palpitations: Secondary | ICD-10-CM | POA: Insufficient documentation

## 2021-01-10 ENCOUNTER — Other Ambulatory Visit: Payer: Self-pay

## 2021-01-10 ENCOUNTER — Ambulatory Visit (INDEPENDENT_AMBULATORY_CARE_PROVIDER_SITE_OTHER): Payer: No Typology Code available for payment source | Admitting: Cardiology

## 2021-01-10 ENCOUNTER — Ambulatory Visit (INDEPENDENT_AMBULATORY_CARE_PROVIDER_SITE_OTHER): Payer: No Typology Code available for payment source

## 2021-01-10 ENCOUNTER — Encounter: Payer: Self-pay | Admitting: Cardiology

## 2021-01-10 VITALS — BP 130/84 | HR 70 | Ht 64.0 in | Wt 228.0 lb

## 2021-01-10 DIAGNOSIS — R0609 Other forms of dyspnea: Secondary | ICD-10-CM

## 2021-01-10 DIAGNOSIS — R002 Palpitations: Secondary | ICD-10-CM

## 2021-01-10 DIAGNOSIS — Z9884 Bariatric surgery status: Secondary | ICD-10-CM

## 2021-01-10 DIAGNOSIS — R0600 Dyspnea, unspecified: Secondary | ICD-10-CM

## 2021-01-10 NOTE — Patient Instructions (Signed)
Medication Instructions:  Your physician recommends that you continue on your current medications as directed. Please refer to the Current Medication list given to you today.  *If you need a refill on your cardiac medications before your next appointment, please call your pharmacy*   Lab Work: None If you have labs (blood work) drawn today and your tests are completely normal, you will receive your results only by: Marland Kitchen MyChart Message (if you have MyChart) OR . A paper copy in the mail If you have any lab test that is abnormal or we need to change your treatment, we will call you to review the results.   Testing/Procedures: Your physician has requested that you have an echocardiogram. Echocardiography is a painless test that uses sound waves to create images of your heart. It provides your doctor with information about the size and shape of your heart and how well your heart's chambers and valves are working. This procedure takes approximately one hour. There are no restrictions for this procedure.  A zio monitor was ordered today. It will remain on for 14 days. You will then return monitor and event diary in provided box. It takes 1-2 weeks for report to be downloaded and returned to [name]. We will call you with the results. If monitor falls off or has orange flashing light, please call Zio for further instructions.      Follow-Up: At [facility], you and your health needs are our priority.  As part of our continuing mission to provide you with exceptional heart care, we have created designated Provider Care Teams.  These Care Teams include your primary Cardiologist (physician) and Advanced Practice Providers (APPs -  Physician Assistants and Nurse Practitioners) who all work together to provide you with the care you need, when you need it.  We recommend signing up for the patient portal called "MyChart".  Sign up information is provided on this After Visit Summary.  MyChart is used to  connect with patients for Virtual Visits (Telemedicine).  Patients are able to view lab/test results, encounter notes, upcoming appointments, etc.  Non-urgent messages can be sent to your provider as well.   To learn more about what you can do with MyChart, go to [email or web address].    Your next appointment:   2 month(s)  The format for your next appointment:   In Person  Provider:   Jenne Campus, MD   Other Instructions   Echocardiogram An echocardiogram is a test that uses sound waves (ultrasound) to produce images of the heart. Images from an echocardiogram can provide important information about:  Heart size and shape.  The size and thickness and movement of your heart's walls.  Heart muscle function and strength.  Heart valve function or if you have stenosis. Stenosis is when the heart valves are too narrow.  If blood is flowing backward through the heart valves (regurgitation).  A tumor or infectious growth around the heart valves.  Areas of heart muscle that are not working well because of poor blood flow or injury from a heart attack.  Aneurysm detection. An aneurysm is a weak or damaged part of an artery wall. The wall bulges out from the normal force of blood pumping through the body. Tell a health care provider about:  Any allergies you have.  All medicines you are taking, including vitamins, herbs, eye drops, creams, and over-the-counter medicines.  Any blood disorders you have.  Any surgeries you have had.  Any medical conditions you have.  Whether you  are pregnant or may be pregnant. What are the risks? Generally, this is a safe test. However, problems may occur, including an allergic reaction to dye (contrast) that may be used during the test. What happens before the test? No specific preparation is needed. You may eat and drink normally. What happens during the test?  You will take off your clothes from the waist up and put on a hospital  gown.  Electrodes or electrocardiogram (ECG)patches may be placed on your chest. The electrodes or patches are then connected to a device that monitors your heart rate and rhythm.  You will lie down on a table for an ultrasound exam. A gel will be applied to your chest to help sound waves pass through your skin.  A handheld device, called a transducer, will be pressed against your chest and moved over your heart. The transducer produces sound waves that travel to your heart and bounce back (or "echo" back) to the transducer. These sound waves will be captured in real-time and changed into images of your heart that can be viewed on a video monitor. The images will be recorded on a computer and reviewed by your health care provider.  You may be asked to change positions or hold your breath for a short time. This makes it easier to get different views or better views of your heart.  In some cases, you may receive contrast through an IV in one of your veins. This can improve the quality of the pictures from your heart. The procedure may vary among health care providers and hospitals.   What can I expect after the test? You may return to your normal, everyday life, including diet, activities, and medicines, unless your health care provider tells you not to do that. Follow these instructions at home:  It is up to you to get the results of your test. Ask your health care provider, or the department that is doing the test, when your results will be ready.  Keep all follow-up visits. This is important. Summary  An echocardiogram is a test that uses sound waves (ultrasound) to produce images of the heart.  Images from an echocardiogram can provide important information about the size and shape of your heart, heart muscle function, heart valve function, and other possible heart problems.  You do not need to do anything to prepare before this test. You may eat and drink normally.  After the  echocardiogram is completed, you may return to your normal, everyday life, unless your health care provider tells you not to do that. This information is not intended to replace advice given to you by your health care provider. Make sure you discuss any questions you have with your health care provider. Document Revised: 05/14/2020 Document Reviewed: 05/14/2020 Elsevier Patient Education  2021 [employer].

## 2021-01-10 NOTE — Progress Notes (Signed)
Cardiology Consultation:    Date:  01/10/2021   ID:  Emily [name], DOB 11/25/1975, MRN [id number]  PCP:  [name], NP  Cardiologist:  Jenne Campus, MD   Referring MD: [name], NP   Chief Complaint  Patient presents with  . Palpitations    History of Present Illness:    Emily [name] is a 45 y.o. female who is being seen today for the evaluation of palpitations at the request of [name], NP.  For almost 20 years she has been experiencing palpitations when she mean by that is situational her car heart can speed up ago fast usually regular that sensation lasted for about 15 to 20 seconds.  She may feel a bit dizzy and sometimes short of breath when it happened never passed out because of this.  However 2 weeks ago she had very unusual episode she was taking shower started having palpitation became really very dizzy also she thinks she was sweating and this sensation lasted unusually long for about 30 minutes on top of that she felt it was somewhat irregular.  It fell simply different she is concerned and worried about possibility of atrial fibrillation.  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  Otherwise she is doing well.  She never had any heart trouble.  She did have gastric bypass surgery many years ago still trying to be active goes and exercise and regular basis at least 3 times a week and she has no difficulty doing it.  There is no swelling of lower extremities.  And there is no documented sleep apnea.  She cannot tell me if she snores or not.  She does not smoke does not have family history of premature coronary artery disease.  She is not on a special diet.  Past Medical History:  Diagnosis Date  . AC (acromioclavicular) arthritis 05/21/2015  . Acute pain of right shoulder 05/21/2015  . Allergy   . Anemia   . B12 deficiency   . Back pain   . Biceps tendonitis on right 05/21/2015  . Compound nevus 01/15/2014  .  Depression   . Gallbladder problem   . Hypermetropia of both eyes 09/12/2014  . Impingement syndrome of right shoulder 05/21/2015  . Joint pain   . Lactose intolerance   . Morbid obesity ([place]) 08/15/2017  . Multiple food allergies   . Nevus of choroid of left eye 09/12/2014  . Palpitations   . Psoriasis   . Seborrheic dermatitis 02/08/2015  . Soft tissue mass    left flank area  . Status post gastric bypass for obesity 12/08/2016  . Swelling   . Vitamin B 12 deficiency 12/08/2016  . Vitamin D deficiency     Past Surgical History:  Procedure Laterality Date  . CHOLECYSTECTOMY  2006  . COLPOSCOPY  2008  . ENDOMETRIAL BIOPSY  2017   negative per pt  . GASTRIC BYPASS  2007  . MASS EXCISION Left 08/17/2017   Procedure: EXCISION SOFT TISSUE  MASS LEFT FLANK;  Surgeon: [name], MD;  Location: [facility];  Service: General;  Laterality: Left;    Current Medications: Current Meds  Medication Sig  . buPROPion (WELLBUTRIN XL) 150 MG 24 hr tablet TAKE 1 TABLET (150 MG TOTAL) BY MOUTH DAILY. (Patient taking differently: Take 150 mg by mouth daily.)  . clobetasol (TEMOVATE) 0.05 % external solution APPLY 1 APPLICATION ON TO THE SKIN 2 TIMES DAILY (Patient taking differently: Apply 1 application topically  2 (two) times daily.)  . COVID-19 mRNA vaccine, Pfizer, 30 MCG/0.3ML injection INJECT AS DIRECTED (Patient taking differently: Inject 0.3 mLs into the muscle once.)  . cyanocobalamin (,VITAMIN B-12,) 1000 MCG/ML injection INJECT 1 ML INTO THE MUSCLE EVERY 30 DAYS (Patient taking differently: Inject 1,000 mcg into the muscle every 30 (thirty) days.)  . Ferrous Sulfate (IRON) 325 (65 Fe) MG TABS Take 1 tablet (325 mg total) by mouth every other day.  . folic acid (FOLVITE) 1 MG tablet Take 1 mg by mouth daily.  Marland Kitchen NEEDLE, DISP, 23 G 23G X 1-1/2" MISC Use as directed (Patient taking differently: 1 application by Other route as needed. Use as directed for B12)  .  norethindrone-ethinyl estradiol-iron (MICROGESTIN FE 1.5/30) 1.5-30 MG-MCG tablet Take 1 tablet by mouth daily.     Allergies:   Almond oil, Apple, Kiwi extract, Soy allergy, and Strawberry extract   Social History   Socioeconomic History  . Marital status: Single    Spouse name: Not on file  . Number of children: Not on file  . Years of education: Not on file  . Highest education level: Not on file  Occupational History  . Occupation: Programmer, multimedia: [employer]  Tobacco Use  . Smoking status: Never Smoker  . Smokeless tobacco: Never Used  Substance and Sexual Activity  . Alcohol use: Yes    Comment: 2-3 glasses a week  . Drug use: No  . Sexual activity: Yes    Birth control/protection: Pill  Other Topics Concern  . Not on file  Social History Narrative   Works as a Engineer, maintenance at [employer]   No children   Not married   Has a dog named Kaylor   From [place]- moved to [place] in 2002   Parents are both living   Has one sister back home   Enjoys going out to eat with friends   Enjoys biking and kayaking.    Goes to the GYM 6 days a week   Social Determinants of Radio broadcast assistant Strain: Not on file  Food Insecurity: Not on file  Transportation Needs: Not on file  Physical Activity: Not on file  Stress: Not on file  Social Connections: Not on file     Family History: The patient's family history includes Alzheimer's disease in her maternal grandmother and paternal grandmother; Cancer in her paternal grandfather; Diabetes type II in her maternal aunt; Hypertension in her father and mother; Leukemia in her maternal grandfather; Obesity in her father and mother; Sleep apnea in her father; Stroke in her maternal aunt. ROS:   Please see the history of present illness.    All 14 point review of systems negative except as described per history of present illness.  EKGs/Labs/Other Studies Reviewed:    The following studies were reviewed today:   EKG:   EKG is  ordered today.  The ekg ordered today demonstrates normal sinus rhythm, normal P interval, normal QS complex duration morphology no ST segment changes.  Recent Labs: 06/03/2020: ALT 20 12/24/2020: BUN 21; Creatinine, Ser 0.70; Hemoglobin 11.2; Platelets 413.0; Potassium 4.5; Sodium 138; TSH 2.37  Recent Lipid Panel    Component Value Date/Time   CHOL 229 (H) 06/03/2020 0758   CHOL 200 (H) 02/07/2018 0812   TRIG 64.0 06/03/2020 0758   HDL 129.80 06/03/2020 0758   HDL 113 02/07/2018 0812   CHOLHDL 2 06/03/2020 0758   VLDL 12.8 06/03/2020 0758  LDLCALC 87 06/03/2020 0758   LDLCALC 74 02/07/2018 0812    Physical Exam:    VS:  BP 130/84 (BP Location: Right Arm, Patient Position: Sitting)   Pulse 70   Ht 5\' 4"  (1.626 m)   Wt 228 lb (103.4 kg)   SpO2 96%   BMI 39.14 kg/m     Wt Readings from Last 3 Encounters:  01/10/21 228 lb (103.4 kg)  12/24/20 230 lb (104.3 kg)  11/20/20 242 lb (109.8 kg)     GEN:  Well nourished, well developed in no acute distress HEENT: Normal NECK: No JVD; No carotid bruits LYMPHATICS: No lymphadenopathy CARDIAC: RRR, no murmurs, no rubs, no gallops RESPIRATORY:  Clear to auscultation without rales, wheezing or rhonchi  ABDOMEN: Soft, non-tender, non-distended MUSCULOSKELETAL:  No edema; No deformity  SKIN: Warm and dry NEUROLOGIC:  Alert and oriented x 3 PSYCHIATRIC:  Normal affect   ASSESSMENT:    1. Palpitations   2. Dyspnea on exertion   3. Status post gastric bypass for obesity    PLAN:    In order of problems listed above:  1. Palpitations.  I will put Zio patch for 2 weeks to see if he can catch arrhythmia.  We did talk also about need to get apple watch that will allow her to record EKG and she is planning to do that.  As a part of evaluation I will schedule her to have an echocardiogram to check a left ventricle ejection fraction, however she is exercising on the regular basis I doubt will be able to identify some significant  organic problem with her heart. 2. Dyspnea on exertion in spite of that she exercise aggressively she has no difficulty doing it. 3. Status post gastric bypass surgery noted.  She told me she lost about 130 pounds after that she gained about 30 but are now trying to work hard on losing weight again. 4. Cholesterol status her LDL is 87 HDL is 129 5.   She does not need any medication to lower her cholesterol.   Medication Adjustments/Labs and Tests Ordered: Current medicines are reviewed at length with the patient today.  Concerns regarding medicines are outlined above.  No orders of the defined types were placed in this encounter.  No orders of the defined types were placed in this encounter.   Signed, Park Liter, MD, [facility]. 01/10/2021 10:18 AM    [place]

## 2021-01-15 ENCOUNTER — Other Ambulatory Visit (HOSPITAL_BASED_OUTPATIENT_CLINIC_OR_DEPARTMENT_OTHER): Payer: Self-pay

## 2021-01-15 MED FILL — Bupropion HCl Tab ER 24HR 150 MG: ORAL | 90 days supply | Qty: 90 | Fill #0 | Status: AC

## 2021-02-13 ENCOUNTER — Telehealth: Payer: Self-pay | Admitting: Emergency Medicine

## 2021-02-13 ENCOUNTER — Telehealth: Payer: Self-pay | Admitting: Cardiology

## 2021-02-13 ENCOUNTER — Other Ambulatory Visit (HOSPITAL_BASED_OUTPATIENT_CLINIC_OR_DEPARTMENT_OTHER): Payer: Self-pay

## 2021-02-13 MED ORDER — DILTIAZEM HCL ER COATED BEADS 120 MG PO CP24
120.0000 mg | ORAL_CAPSULE | Freq: Every day | ORAL | 1 refills | Status: DC
  Filled 2021-02-13: qty 90, 90d supply, fill #0

## 2021-02-13 NOTE — Telephone Encounter (Signed)
Follow up:     Patient returning a call back for results.

## 2021-02-13 NOTE — Telephone Encounter (Signed)
Called patient informed her of results. She will start cardizem 120 mg daily. No further questions.

## 2021-02-13 NOTE — Telephone Encounter (Signed)
-----   Message from Park Liter, MD sent at 02/13/2021  8:00 AM EDT ----- Monitor showed SVT, please start Cardizem CD 120 daily

## 2021-02-13 NOTE — Telephone Encounter (Signed)
Patient informed of results see additional encounter.  

## 2021-02-14 ENCOUNTER — Other Ambulatory Visit (HOSPITAL_BASED_OUTPATIENT_CLINIC_OR_DEPARTMENT_OTHER): Payer: Self-pay

## 2021-02-17 ENCOUNTER — Ambulatory Visit (HOSPITAL_BASED_OUTPATIENT_CLINIC_OR_DEPARTMENT_OTHER)
Admission: RE | Admit: 2021-02-17 | Discharge: 2021-02-17 | Disposition: A | Discharge status: Home / Self Care | Payer: No Typology Code available for payment source | Source: Ambulatory Visit | Attending: Cardiology | Admitting: Cardiology

## 2021-02-17 ENCOUNTER — Other Ambulatory Visit: Payer: Self-pay

## 2021-02-17 DIAGNOSIS — R002 Palpitations: Secondary | ICD-10-CM | POA: Insufficient documentation

## 2021-02-17 DIAGNOSIS — R0600 Dyspnea, unspecified: Secondary | ICD-10-CM | POA: Diagnosis present

## 2021-02-17 DIAGNOSIS — R0609 Other forms of dyspnea: Secondary | ICD-10-CM

## 2021-02-17 LAB — ECHOCARDIOGRAM COMPLETE
Area-P 1/2: 2.83 cm2
S' Lateral: 2.78 cm

## 2021-02-17 NOTE — Progress Notes (Signed)
  Echocardiogram 2D Echocardiogram has been performed.  Emily [name] 02/17/2021, 10:51 AM

## 2021-02-19 ENCOUNTER — Ambulatory Visit (INDEPENDENT_AMBULATORY_CARE_PROVIDER_SITE_OTHER): Payer: No Typology Code available for payment source | Admitting: Family

## 2021-02-19 ENCOUNTER — Other Ambulatory Visit: Payer: Self-pay

## 2021-02-19 VITALS — BP 126/53 | HR 62 | Temp 97.8°F | Resp 16

## 2021-02-19 DIAGNOSIS — Z1881 Retained glass fragments: Secondary | ICD-10-CM | POA: Diagnosis not present

## 2021-02-19 NOTE — Assessment & Plan Note (Signed)
After verbal consent was obtained, the area was cleansed with betadine and a sterile needle tip and tweezers were used to successfully remove a very small shard of glass.  Pt tolerated procedure well and is advised to call if recurrent pain occurs.

## 2021-02-19 NOTE — Progress Notes (Signed)
Subjective:   By signing my name below, I, [name], attest that this documentation has been prepared under the direction and in the presence of [name]. 02/19/2021      Patient ID: Emily [name], female    DOB: 11/11/1975, 45 y.o.   MRN: [id number]  No chief complaint on file.   HPI Patient presents today with small glass sliver in her right heel cause by stepping on a small glass shard last night. She reports that the area is sore to step on.    Past Medical History:  Diagnosis Date  . AC (acromioclavicular) arthritis 05/21/2015  . Acute pain of right shoulder 05/21/2015  . Allergy   . Anemia   . B12 deficiency   . Back pain   . Biceps tendonitis on right 05/21/2015  . Compound nevus 01/15/2014  . Depression   . Gallbladder problem   . Hypermetropia of both eyes 09/12/2014  . Impingement syndrome of right shoulder 05/21/2015  . Joint pain   . Lactose intolerance   . Morbid obesity ([place]) 08/15/2017  . Multiple food allergies   . Nevus of choroid of left eye 09/12/2014  . Palpitations   . Psoriasis   . Seborrheic dermatitis 02/08/2015  . Soft tissue mass    left flank area  . Status post gastric bypass for obesity 12/08/2016  . Swelling   . Vitamin B 12 deficiency 12/08/2016  . Vitamin D deficiency     Past Surgical History:  Procedure Laterality Date  . CHOLECYSTECTOMY  2006  . COLPOSCOPY  2008  . ENDOMETRIAL BIOPSY  2017   negative per pt  . GASTRIC BYPASS  2007  . MASS EXCISION Left 08/17/2017   Procedure: EXCISION SOFT TISSUE  MASS LEFT FLANK;  Surgeon: [name], MD;  Location: [facility];  Service: General;  Laterality: Left;    Family History  Problem Relation Age of Onset  . Hypertension Mother   . Obesity Mother   . Hypertension Father   . Sleep apnea Father   . Obesity Father   . Alzheimer's disease Maternal Grandmother   . Leukemia Maternal Grandfather   . Alzheimer's disease Paternal Grandmother   . Cancer  Paternal Grandfather   . Diabetes type II Maternal Aunt   . Stroke Maternal Aunt     Social History   Socioeconomic History  . Marital status: Single    Spouse name: Not on file  . Number of children: Not on file  . Years of education: Not on file  . Highest education level: Not on file  Occupational History  . Occupation: Programmer, multimedia: [employer]  Tobacco Use  . Smoking status: Never Smoker  . Smokeless tobacco: Never Used  Substance and Sexual Activity  . Alcohol use: Yes    Comment: 2-3 glasses a week  . Drug use: No  . Sexual activity: Yes    Birth control/protection: Pill  Other Topics Concern  . Not on file  Social History Narrative   Works as a Engineer, maintenance at [employer]   No children   Not married   Has a dog named T[place]on   From [place]- moved to [place] in 2002   Parents are both living   Has one sister back home   Enjoys going out to eat with friends   Enjoys biking and kayaking.    Goes to the GYM 6 days a week   Social Determinants of  Health   Financial Resource Strain: Not on file  Food Insecurity: Not on file  Transportation Needs: Not on file  Physical Activity: Not on file  Stress: Not on file  Social Connections: Not on file  Intimate Partner Violence: Not on file    Outpatient Medications Prior to Visit  Medication Sig Dispense Refill  . buPROPion (WELLBUTRIN XL) 150 MG 24 hr tablet TAKE 1 TABLET (150 MG TOTAL) BY MOUTH DAILY. (Patient taking differently: Take 150 mg by mouth daily.) 90 tablet 1  . clobetasol (TEMOVATE) 0.05 % external solution APPLY 1 APPLICATION ON TO THE SKIN 2 TIMES DAILY (Patient taking differently: Apply 1 application topically 2 (two) times daily.) 50 mL 3  . COVID-19 mRNA vaccine, Pfizer, 30 MCG/0.3ML injection INJECT AS DIRECTED (Patient taking differently: Inject 0.3 mLs into the muscle once.) .3 mL 0  . cyanocobalamin (,VITAMIN B-12,) 1000 MCG/ML injection INJECT 1 ML INTO THE MUSCLE EVERY 30 DAYS (Patient  taking differently: Inject 1,000 mcg into the muscle every 30 (thirty) days.) 12 mL 4  . diltiazem (CARDIZEM CD) 120 MG 24 hr capsule Take 1 capsule (120 mg total) by mouth daily. 90 capsule 1  . Ferrous Sulfate (IRON) 325 (65 Fe) MG TABS Take 1 tablet (325 mg total) by mouth every other day. 30 tablet 0  . folic acid (FOLVITE) 1 MG tablet Take 1 mg by mouth daily.    Marland Kitchen NEEDLE, DISP, 23 G 23G X 1-1/2" MISC Use as directed (Patient taking differently: 1 application by Other route as needed. Use as directed for B12) 12 each 1  . norethindrone-ethinyl estradiol-iron (MICROGESTIN FE 1.5/30) 1.5-30 MG-MCG tablet Take 1 tablet by mouth daily. 84 tablet 4  . SYRINGE-NEEDLE, DISP, 3 ML 23G X 1-1/2" 3 ML MISC USE AS DIRECTED (Patient taking differently: 1 application by Other route See admin instructions. Use for B 12) 12 each 1  . valACYclovir (VALTREX) 1000 MG tablet TAKE 2 TABLETS BY MOUTH NOW AND 2 TABLETS BY MOUTH IN 12 HOURS FOR COLD SORE (Patient taking differently: Take 1,000 mg by mouth as needed (Cold sore).) 12 tablet 4   No facility-administered medications prior to visit.    Allergies  Allergen Reactions  . Almond Oil Swelling  . Apple Swelling  . Kiwi Extract Swelling  . Soy Allergy Other (See Comments)  . Strawberry Extract Other (See Comments)    Review of Systems  Endo/Heme/Allergies:       (+)Bleeding in the right foot       Objective:    Physical Exam Constitutional:      Appearance: Normal appearance.  Skin:    Comments: Very tiny superficial shard palpated beneath skin of right heel.   Neurological:     Mental Status: She is alert and oriented to person, place, and time.  Psychiatric:        Attention and Perception: Attention normal.        Mood and Affect: Mood normal.        Speech: Speech normal.        Behavior: Behavior normal.     BP (!) 126/53 (BP Location: Right Arm, Patient Position: Sitting, Cuff Size: Large)   Pulse 62   Temp 97.8 F (36.6 C)  (Oral)   Resp 16   SpO2 100%  Wt Readings from Last 3 Encounters:  01/10/21 228 lb (103.4 kg)  12/24/20 230 lb (104.3 kg)  11/20/20 242 lb (109.8 kg)    Diabetic Foot Exam - Simple  No data filed    Lab Results  Component Value Date   WBC 5.8 12/24/2020   HGB 11.2 (L) 12/24/2020   HCT 35.8 (L) 12/24/2020   PLT 413.0 (H) 12/24/2020   GLUCOSE 91 12/24/2020   CHOL 229 (H) 06/03/2020   TRIG 64.0 06/03/2020   HDL 129.80 06/03/2020   LDLCALC 87 06/03/2020   ALT 20 06/03/2020   AST 29 06/03/2020   NA 138 12/24/2020   K 4.5 12/24/2020   CL 101 12/24/2020   CREATININE 0.70 12/24/2020   BUN 21 12/24/2020   CO2 27 12/24/2020   TSH 2.37 12/24/2020   HGBA1C 4.9 01/26/2017    Lab Results  Component Value Date   TSH 2.37 12/24/2020   Lab Results  Component Value Date   WBC 5.8 12/24/2020   HGB 11.2 (L) 12/24/2020   HCT 35.8 (L) 12/24/2020   MCV 69.2 (L) 12/24/2020   PLT 413.0 (H) 12/24/2020   Lab Results  Component Value Date   NA 138 12/24/2020   K 4.5 12/24/2020   CO2 27 12/24/2020   GLUCOSE 91 12/24/2020   BUN 21 12/24/2020   CREATININE 0.70 12/24/2020   BILITOT 1.1 06/03/2020   ALKPHOS 81 06/03/2020   AST 29 06/03/2020   ALT 20 06/03/2020   PROT 6.6 06/03/2020   ALBUMIN 3.8 06/03/2020   CALCIUM 9.3 12/24/2020   GFR 103.29 12/24/2020   Lab Results  Component Value Date   CHOL 229 (H) 06/03/2020   Lab Results  Component Value Date   HDL 129.80 06/03/2020   Lab Results  Component Value Date   LDLCALC 87 06/03/2020   Lab Results  Component Value Date   TRIG 64.0 06/03/2020   Lab Results  Component Value Date   CHOLHDL 2 06/03/2020   Lab Results  Component Value Date   HGBA1C 4.9 01/26/2017       Assessment & Plan:   Problem List Items Addressed This Visit   None      No orders of the defined types were placed in this encounter.   I, [name], personally preformed the services described in this documentation.  All  medical record entries made by the scribe were at my direction and in my presence.  I have reviewed the chart and discharge instructions (if applicable) and agree that the record reflects my personal performance and is accurate and complete. 02/19/2021   I,[name],acting as a Education administrator for Nance Pear, NP.,have documented all relevant documentation on the behalf of Nance Pear, NP,as directed by  Nance Pear, NP while in the presence of Nance Pear, NP.   Shehryar [employer]

## 2021-02-20 ENCOUNTER — Telehealth: Payer: Self-pay | Admitting: Cardiology

## 2021-02-20 NOTE — Telephone Encounter (Signed)
Called patient back informed her of results.

## 2021-02-20 NOTE — Telephone Encounter (Signed)
Pt is returning call in regards to results. Please advise

## 2021-03-04 ENCOUNTER — Telehealth: Payer: Self-pay | Admitting: Family

## 2021-03-04 ENCOUNTER — Other Ambulatory Visit (HOSPITAL_BASED_OUTPATIENT_CLINIC_OR_DEPARTMENT_OTHER): Payer: Self-pay

## 2021-03-04 MED ORDER — CEPHALEXIN 500 MG PO CAPS
500.0000 mg | ORAL_CAPSULE | Freq: Three times a day (TID) | ORAL | 0 refills | Status: DC
  Filled 2021-03-04: qty 21, 7d supply, fill #0

## 2021-03-04 MED FILL — Valacyclovir HCl Tab 1 GM: ORAL | 3 days supply | Qty: 12 | Fill #0 | Status: AC

## 2021-03-04 NOTE — Telephone Encounter (Signed)
Pt reports bad sunburn to top of left ear which occurred 5/27.  Now with increased pain/redness, swelling.  Will rx with keflex.

## 2021-03-12 ENCOUNTER — Ambulatory Visit (INDEPENDENT_AMBULATORY_CARE_PROVIDER_SITE_OTHER): Payer: No Typology Code available for payment source | Admitting: Cardiology

## 2021-03-12 ENCOUNTER — Encounter: Payer: Self-pay | Admitting: Cardiology

## 2021-03-12 ENCOUNTER — Other Ambulatory Visit: Payer: Self-pay

## 2021-03-12 ENCOUNTER — Other Ambulatory Visit (HOSPITAL_BASED_OUTPATIENT_CLINIC_OR_DEPARTMENT_OTHER): Payer: Self-pay

## 2021-03-12 VITALS — BP 112/68 | HR 78 | Ht 65.0 in | Wt 225.0 lb

## 2021-03-12 DIAGNOSIS — R002 Palpitations: Secondary | ICD-10-CM

## 2021-03-12 DIAGNOSIS — R0609 Other forms of dyspnea: Secondary | ICD-10-CM

## 2021-03-12 DIAGNOSIS — I471 Supraventricular tachycardia: Secondary | ICD-10-CM

## 2021-03-12 DIAGNOSIS — R0600 Dyspnea, unspecified: Secondary | ICD-10-CM | POA: Diagnosis not present

## 2021-03-12 MED ORDER — DILTIAZEM HCL ER COATED BEADS 180 MG PO CP24
180.0000 mg | ORAL_CAPSULE | Freq: Every day | ORAL | 1 refills | Status: DC
  Filled 2021-03-12: qty 90, 90d supply, fill #0

## 2021-03-12 NOTE — Progress Notes (Signed)
Cardiology Office Note:    Date:  03/12/2021   ID:  Emily [name], DOB May 31, 1976, MRN [id number]  PCP:  [name], NP  Cardiologist:  Jenne Campus, MD    Referring MD: [name], NP   Chief Complaint  Patient presents with  . Follow-up    Palpitation ,while using Diltiazem    History of Present Illness:    Emily [name] is a 45 y.o. female with past medical history significant for palpitations, dyspnea on exertion, obesity status post gastric bypass surgery.  She was referred to [name] because of palpitations monitor that she wear reveal short episode of supraventricular tachycardia which were symptomatic.  She also have resting bradycardia with average heart rate of 67 therefore, I decided to try to treat her with calcium channel blocker rather than beta-blocker she received 120 mg of Cardizem CD and she said she is feeling slightly worse with this she said she got short lasting episode of palpitation but may be a little more frequent.  Denies have any dizziness or passing out overall otherwise seems to be doing well.  Past Medical History:  Diagnosis Date  . AC (acromioclavicular) arthritis 05/21/2015  . Acute pain of right shoulder 05/21/2015  . Allergy   . Anemia   . B12 deficiency   . Back pain   . Biceps tendonitis on right 05/21/2015  . Compound nevus 01/15/2014  . Depression   . Gallbladder problem   . Hypermetropia of both eyes 09/12/2014  . Impingement syndrome of right shoulder 05/21/2015  . Joint pain   . Lactose intolerance   . Morbid obesity ([place]) 08/15/2017  . Multiple food allergies   . Nevus of choroid of left eye 09/12/2014  . Palpitations   . Psoriasis   . Seborrheic dermatitis 02/08/2015  . Soft tissue mass    left flank area  . Status post gastric bypass for obesity 12/08/2016  . Swelling   . Vitamin B 12 deficiency 12/08/2016  . Vitamin D deficiency     Past Surgical History:  Procedure Laterality Date  . CHOLECYSTECTOMY  2006  .  COLPOSCOPY  2008  . ENDOMETRIAL BIOPSY  2017   negative per pt  . GASTRIC BYPASS  2007  . MASS EXCISION Left 08/17/2017   Procedure: EXCISION SOFT TISSUE  MASS LEFT FLANK;  Surgeon: [name], MD;  Location: [facility];  Service: General;  Laterality: Left;    Current Medications: Current Meds  Medication Sig  . buPROPion (WELLBUTRIN XL) 150 MG 24 hr tablet TAKE 1 TABLET (150 MG TOTAL) BY MOUTH DAILY. (Patient taking differently: Take 150 mg by mouth daily.)  . clobetasol (TEMOVATE) 0.05 % external solution APPLY 1 APPLICATION ON TO THE SKIN 2 TIMES DAILY (Patient taking differently: Apply 1 application topically 2 (two) times daily.)  . COVID-19 mRNA vaccine, Pfizer, 30 MCG/0.3ML injection INJECT AS DIRECTED (Patient taking differently: Inject 0.3 mLs into the muscle once.)  . cyanocobalamin (,VITAMIN B-12,) 1000 MCG/ML injection INJECT 1 ML INTO THE MUSCLE EVERY 30 DAYS (Patient taking differently: Inject 1,000 mcg into the muscle every 30 (thirty) days.)  . diltiazem (CARDIZEM CD) 120 MG 24 hr capsule Take 1 capsule (120 mg total) by mouth daily.  . Ferrous Sulfate (IRON) 325 (65 Fe) MG TABS Take 1 tablet (325 mg total) by mouth every other day.  . folic acid (FOLVITE) 1 MG tablet Take 1 mg by mouth daily.  . norethindrone-ethinyl estradiol-iron (MICROGESTIN FE 1.5/30) 1.5-30 MG-MCG tablet Take 1 tablet  by mouth daily.  . SYRINGE-NEEDLE, DISP, 3 ML 23G X 1-1/2" 3 ML MISC USE AS DIRECTED (Patient taking differently: 1 application by Other route See admin instructions. Use for B 12)  . valACYclovir (VALTREX) 1000 MG tablet TAKE 2 TABLETS BY MOUTH NOW AND 2 TABLETS BY MOUTH IN 12 HOURS FOR COLD SORE (Patient taking differently: Take 1,000 mg by mouth as needed (Cold sore).)     Allergies:   Almond oil, Apple, Kiwi extract, Soy allergy, and Strawberry extract   Social History   Socioeconomic History  . Marital status: Single    Spouse name: Not on file  . Number of  children: Not on file  . Years of education: Not on file  . Highest education level: Not on file  Occupational History  . Occupation: Programmer, multimedia: [employer]  Tobacco Use  . Smoking status: Never Smoker  . Smokeless tobacco: Never Used  Substance and Sexual Activity  . Alcohol use: Yes    Comment: 2-3 glasses a week  . Drug use: No  . Sexual activity: Yes    Birth control/protection: Pill  Other Topics Concern  . Not on file  Social History Narrative   Works as a Engineer, maintenance at [employer]   No children   Not married   Has a dog named Levant   From [place]- moved to [place] in 2002   Parents are both living   Has one sister back home   Enjoys going out to eat with friends   Enjoys biking and kayaking.    Goes to the GYM 6 days a week   Social Determinants of Radio broadcast assistant Strain: Not on file  Food Insecurity: Not on file  Transportation Needs: Not on file  Physical Activity: Not on file  Stress: Not on file  Social Connections: Not on file     Family History: The patient's family history includes Alzheimer's disease in her maternal grandmother and paternal grandmother; Cancer in her paternal grandfather; Diabetes type II in her maternal aunt; Hypertension in her father and mother; Leukemia in her maternal grandfather; Obesity in her father and mother; Sleep apnea in her father; Stroke in her maternal aunt. ROS:   Please see the history of present illness.    All 14 point review of systems negative except as described per history of present illness  EKGs/Labs/Other Studies Reviewed:      Recent Labs: 06/03/2020: ALT 20 12/24/2020: BUN 21; Creatinine, Ser 0.70; Hemoglobin 11.2; Platelets 413.0; Potassium 4.5; Sodium 138; TSH 2.37  Recent Lipid Panel    Component Value Date/Time   CHOL 229 (H) 06/03/2020 0758   CHOL 200 (H) 02/07/2018 0812   TRIG 64.0 06/03/2020 0758   HDL 129.80 06/03/2020 0758   HDL 113 02/07/2018 0812   CHOLHDL 2  06/03/2020 0758   VLDL 12.8 06/03/2020 0758   LDLCALC 87 06/03/2020 0758   LDLCALC 74 02/07/2018 0812    Physical Exam:    VS:  BP 112/68 (BP Location: Right Arm, Patient Position: Sitting)   Pulse 78   Ht 5\' 5"  (1.651 m)   Wt 225 lb (102.1 kg)   SpO2 99%   BMI 37.44 kg/m     Wt Readings from Last 3 Encounters:  03/12/21 225 lb (102.1 kg)  01/10/21 228 lb (103.4 kg)  12/24/20 230 lb (104.3 kg)     GEN:  Well nourished, well developed in no acute distress HEENT: Normal  NECK: No JVD; No carotid bruits LYMPHATICS: No lymphadenopathy CARDIAC: RRR, no murmurs, no rubs, no gallops RESPIRATORY:  Clear to auscultation without rales, wheezing or rhonchi  ABDOMEN: Soft, non-tender, non-distended MUSCULOSKELETAL:  No edema; No deformity  SKIN: Warm and dry LOWER EXTREMITIES: no swelling NEUROLOGIC:  Alert and oriented x 3 PSYCHIATRIC:  Normal affect   ASSESSMENT:    1. Palpitations   2. Supraventricular tachycardia ([place])   3. Dyspnea on exertion    PLAN:    In order of problems listed above:  1. Supraventricular tachycardia which is a new problem finally detected by monitor.  I did review her echocardiogram which showed normal structural heart.  I tried to do small dose of calcium channel blocker however she is tell me that she is feeling I will be worse with that.  I presented her with 2 options today.  We can either discontinue calcium channel blocker and try very small dose of beta-blockers or can try to go up with calcium channel blocker hopefully by doing this will completely suppress this arrhythmia and then she will feel better out of those 2 options she decided to go up with Cardizem CD, therefore we will go to Cardizem CD 180 daily with understanding if she feels worse with this we stopped that medication try small dose of beta-blocker. 2. Palpitations: Plan as described above. 3. Dyspnea on exertion echocardiogram showed preserved ejection fraction. 4. Dyslipidemia I  did review her K PN which show HDL of 129 with LDL of 87.  Continue conservative approach.   Medication Adjustments/Labs and Tests Ordered: Current medicines are reviewed at length with the patient today.  Concerns regarding medicines are outlined above.  No orders of the defined types were placed in this encounter.  Medication changes: No orders of the defined types were placed in this encounter.   Signed, Park Liter, MD, [facility] 03/12/2021 9:35 AM    [place]

## 2021-03-12 NOTE — Addendum Note (Signed)
Addended by: Senaida Ores on: 03/12/2021 09:39 AM   Modules accepted: Orders

## 2021-03-12 NOTE — Patient Instructions (Signed)
Medication Instructions:  Your physician has recommended you make the following change in your medication:  INCREASE: Cardizem to 180 mg daily.  *If you need a refill on your cardiac medications before your next appointment, please call your pharmacy*   Lab Work: None If you have labs (blood work) drawn today and your tests are completely normal, you will receive your results only by: Marland Kitchen MyChart Message (if you have MyChart) OR . A paper copy in the mail If you have any lab test that is abnormal or we need to change your treatment, we will call you to review the results.   Testing/Procedures: None   Follow-Up: At [name], you and your health needs are our priority.  As part of our continuing mission to provide you with exceptional heart care, we have created designated Provider Care Teams.  These Care Teams include your primary Cardiologist (physician) and Advanced Practice Providers (APPs -  Physician Assistants and Nurse Practitioners) who all work together to provide you with the care you need, when you need it.  We recommend signing up for the patient portal called "MyChart".  Sign up information is provided on this After Visit Summary.  MyChart is used to connect with patients for Virtual Visits (Telemedicine).  Patients are able to view lab/test results, encounter notes, upcoming appointments, etc.  Non-urgent messages can be sent to your provider as well.   To learn more about what you can do with MyChart, go to [email or web address].    Your next appointment:   3 month(s)  The format for your next appointment:   In Person  Provider:   Jenne Campus, MD   Other Instructions

## 2021-04-09 ENCOUNTER — Other Ambulatory Visit: Payer: Self-pay

## 2021-04-09 ENCOUNTER — Ambulatory Visit (INDEPENDENT_AMBULATORY_CARE_PROVIDER_SITE_OTHER): Payer: No Typology Code available for payment source | Admitting: Family

## 2021-04-09 ENCOUNTER — Encounter: Payer: Self-pay | Admitting: Family

## 2021-04-09 ENCOUNTER — Ambulatory Visit (HOSPITAL_BASED_OUTPATIENT_CLINIC_OR_DEPARTMENT_OTHER)
Admission: RE | Admit: 2021-04-09 | Discharge: 2021-04-09 | Disposition: A | Discharge status: Home / Self Care | Payer: No Typology Code available for payment source | Source: Ambulatory Visit | Attending: Family | Admitting: Family

## 2021-04-09 VITALS — BP 118/76 | HR 67 | Resp 17 | Ht 65.0 in | Wt 230.0 lb

## 2021-04-09 DIAGNOSIS — M79672 Pain in left foot: Secondary | ICD-10-CM | POA: Diagnosis not present

## 2021-04-09 DIAGNOSIS — R635 Abnormal weight gain: Secondary | ICD-10-CM | POA: Diagnosis not present

## 2021-04-09 DIAGNOSIS — G8929 Other chronic pain: Secondary | ICD-10-CM | POA: Insufficient documentation

## 2021-04-09 NOTE — Assessment & Plan Note (Signed)
New. Will obtain x-ray to rule out fracture. Further recommendations pending review of these results.

## 2021-04-09 NOTE — Patient Instructions (Signed)
Please complete x-ray on the first floor.  

## 2021-04-09 NOTE — Progress Notes (Signed)
Subjective:     Patient ID: Emily [name], female    DOB: 1976/02/24, 45 y.o.   MRN: [id number]  Chief Complaint  Patient presents with   Foot Pain    Left foot pain, one month, no known injury, no bruising or swelling, knot on foot    Foot Pain   Patient is in today for foot pain.  Pt reports that her left foot began to hurt laterally about 3 months ago. She denies any known injury. She reports that she wears good supportive sneakers and inserts that she gets fitted for at [employer].  She has not tried any medications for the pain.  Wt Readings from Last 3 Encounters:  04/09/21 230 lb (104.3 kg)  03/12/21 225 lb (102.1 kg)  01/10/21 228 lb (103.4 kg)   She reports that she has gained weight. States the weight gain is due to her increased alcohol intake on the weekends. Reports that she drinks michelob ultra but sometimes will have 5 beers and she knows that it is too much and she needs to drink less.   Health Maintenance Due  Topic Date Due   COLONOSCOPY (Pts 45-66yrs Insurance coverage will need to be confirmed)  Never done    Past Medical History:  Diagnosis Date   AC (acromioclavicular) arthritis 05/21/2015   Acute pain of right shoulder 05/21/2015   Allergy    Anemia    B12 deficiency    Back pain    Biceps tendonitis on right 05/21/2015   Compound nevus 01/15/2014   Depression    Gallbladder problem    Hypermetropia of both eyes 09/12/2014   Impingement syndrome of right shoulder 05/21/2015   Joint pain    Lactose intolerance    Morbid obesity ([place]) 08/15/2017   Multiple food allergies    Nevus of choroid of left eye 09/12/2014   Palpitations    Psoriasis    Seborrheic dermatitis 02/08/2015   Soft tissue mass    left flank area   Status post gastric bypass for obesity 12/08/2016   Swelling    Vitamin B 12 deficiency 12/08/2016   Vitamin D deficiency     Past Surgical History:  Procedure Laterality Date   CHOLECYSTECTOMY  2006   COLPOSCOPY  2008    ENDOMETRIAL BIOPSY  2017   negative per pt   GASTRIC BYPASS  2007   MASS EXCISION Left 08/17/2017   Procedure: EXCISION SOFT TISSUE  MASS LEFT FLANK;  Surgeon: [name], MD;  Location: [facility];  Service: General;  Laterality: Left;    Family History  Problem Relation Age of Onset   Hypertension Mother    Obesity Mother    Hypertension Father    Sleep apnea Father    Obesity Father    Alzheimer's disease Maternal Grandmother    Leukemia Maternal Grandfather    Alzheimer's disease Paternal Grandmother    Cancer Paternal Grandfather    Diabetes type II Maternal Aunt    Stroke Maternal Aunt    Multiple sclerosis Maternal Aunt    Heart attack Paternal Aunt     Social History   Socioeconomic History   Marital status: Single    Spouse name: Not on file   Number of children: Not on file   Years of education: Not on file   Highest education level: Not on file  Occupational History   Occupation: Therapist, sports    Employer: [employer]  Tobacco Use   Smoking status: Never  Smokeless tobacco: Never  Substance and Sexual Activity   Alcohol use: Yes    Comment: 2-3 glasses a week   Drug use: No   Sexual activity: Yes    Birth control/protection: Pill  Other Topics Concern   Not on file  Social History Narrative   Works as a Engineer, maintenance at [employer]   No children   Not married   Has a dog named [place]   From [place]- moved to [place] in 2002   Parents are both living   Has one sister back home   Enjoys going out to eat with friends   Enjoys biking and kayaking.    Goes to the GYM 6 days a week   Social Determinants of Radio broadcast assistant Strain: Not on file  Food Insecurity: Not on file  Transportation Needs: Not on file  Physical Activity: Not on file  Stress: Not on file  Social Connections: Not on file  Intimate Partner Violence: Not on file    Outpatient Medications Prior to Visit  Medication Sig Dispense Refill   buPROPion  (WELLBUTRIN XL) 150 MG 24 hr tablet TAKE 1 TABLET (150 MG TOTAL) BY MOUTH DAILY. (Patient taking differently: Take 150 mg by mouth daily.) 90 tablet 1   clobetasol (TEMOVATE) 0.05 % external solution APPLY 1 APPLICATION ON TO THE SKIN 2 TIMES DAILY (Patient taking differently: Apply 1 application topically 2 (two) times daily.) 50 mL 3   COVID-19 mRNA vaccine, Pfizer, 30 MCG/0.3ML injection INJECT AS DIRECTED (Patient taking differently: Inject 0.3 mLs into the muscle once.) .3 mL 0   cyanocobalamin (,VITAMIN B-12,) 1000 MCG/ML injection INJECT 1 ML INTO THE MUSCLE EVERY 30 DAYS (Patient taking differently: Inject 1,000 mcg into the muscle every 30 (thirty) days.) 12 mL 4   diltiazem (CARDIZEM CD) 180 MG 24 hr capsule Take 1 capsule (180 mg total) by mouth daily. 90 capsule 1   Ferrous Sulfate (IRON) 325 (65 Fe) MG TABS Take 1 tablet (325 mg total) by mouth every other day. 30 tablet 0   folic acid (FOLVITE) 1 MG tablet Take 1 mg by mouth daily.     norethindrone-ethinyl estradiol-iron (MICROGESTIN FE 1.5/30) 1.5-30 MG-MCG tablet Take 1 tablet by mouth daily. 84 tablet 4   SYRINGE-NEEDLE, DISP, 3 ML 23G X 1-1/2" 3 ML MISC USE AS DIRECTED (Patient taking differently: 1 application by Other route See admin instructions. Use for B 12) 12 each 1   valACYclovir (VALTREX) 1000 MG tablet TAKE 2 TABLETS BY MOUTH NOW AND 2 TABLETS BY MOUTH IN 12 HOURS FOR COLD SORE (Patient taking differently: Take 1,000 mg by mouth as needed (Cold sore).) 12 tablet 4   No facility-administered medications prior to visit.    Allergies  Allergen Reactions   Almond Oil Swelling   Apple Swelling   Kiwi Extract Swelling   Soy Allergy Other (See Comments)   Strawberry Extract Other (See Comments)    ROS See HPI     Objective:    Physical Exam Constitutional:      Appearance: Normal appearance.  Musculoskeletal:        General: Tenderness (left lateral foot at the base of the 5th metatarsal) present. No swelling.   Neurological:     Mental Status: She is alert.    BP 118/76 (BP Location: Left Arm, Patient Position: Sitting, Cuff Size: Large)   Pulse 67   Resp 17   Ht 5\' 5"  (1.651 m)  Wt 230 lb (104.3 kg)   SpO2 97%   BMI 38.27 kg/m  Wt Readings from Last 3 Encounters:  04/09/21 230 lb (104.3 kg)  03/12/21 225 lb (102.1 kg)  01/10/21 228 lb (103.4 kg)       Assessment & Plan:   Problem List Items Addressed This Visit       Unprioritized   Weight gain    Encouraged pt to cut back on alcohol intake which she plans to work on.        Left foot pain - Primary    New. Will obtain x-ray to rule out fracture. Further recommendations pending review of these results.        Relevant Orders   DG Foot Complete Left    I am having Emily [name] "Maudie Mercury" maintain her folic acid, norethindrone-ethinyl estradiol-iron, Iron, buPROPion, SYRINGE-NEEDLE (DISP) 3 ML, valACYclovir, cyanocobalamin, clobetasol, COVID-19 mRNA vaccine ([place]), and diltiazem.  No orders of the defined types were placed in this encounter.

## 2021-04-09 NOTE — Assessment & Plan Note (Signed)
Encouraged pt to cut back on alcohol intake which she plans to work on.

## 2021-04-24 ENCOUNTER — Other Ambulatory Visit (HOSPITAL_BASED_OUTPATIENT_CLINIC_OR_DEPARTMENT_OTHER): Payer: Self-pay

## 2021-04-24 MED FILL — Bupropion HCl Tab ER 24HR 150 MG: ORAL | 90 days supply | Qty: 90 | Fill #1 | Status: AC

## 2021-05-06 ENCOUNTER — Ambulatory Visit (INDEPENDENT_AMBULATORY_CARE_PROVIDER_SITE_OTHER): Payer: No Typology Code available for payment source | Admitting: Family

## 2021-05-06 ENCOUNTER — Other Ambulatory Visit: Payer: Self-pay

## 2021-05-06 ENCOUNTER — Other Ambulatory Visit (HOSPITAL_BASED_OUTPATIENT_CLINIC_OR_DEPARTMENT_OTHER): Payer: Self-pay

## 2021-05-06 VITALS — BP 116/46 | HR 64 | Temp 97.8°F | Resp 16 | Ht 65.0 in | Wt 225.0 lb

## 2021-05-06 DIAGNOSIS — R0981 Nasal congestion: Secondary | ICD-10-CM

## 2021-05-06 DIAGNOSIS — J40 Bronchitis, not specified as acute or chronic: Secondary | ICD-10-CM | POA: Insufficient documentation

## 2021-05-06 MED ORDER — AZITHROMYCIN 250 MG PO TABS
ORAL_TABLET | ORAL | 0 refills | Status: AC
  Filled 2021-05-06: qty 6, 5d supply, fill #0

## 2021-05-06 MED ORDER — BENZONATATE 100 MG PO CAPS
100.0000 mg | ORAL_CAPSULE | Freq: Two times a day (BID) | ORAL | 0 refills | Status: DC | PRN
  Filled 2021-05-06: qty 20, 10d supply, fill #0

## 2021-05-06 NOTE — Assessment & Plan Note (Signed)
New. I did send a covid PCR swab.  Advised pt as follows:    Please begin zpak and as needed tessalon. You can use nasal saline spray and mucinex as needed for congestion. Let us know if you develop worsening symptoms or if symptoms are not improved in 3-4 days.

## 2021-05-06 NOTE — Progress Notes (Signed)
az  Subjective:     Patient ID: Emily [name], female    DOB: 1976/05/03, 45 y.o.   MRN: [id number]  Chief Complaint  Patient presents with   Cough    Complains of productive cough, negative covid last Thursday and today.    Nasal Congestion    Complains of nasal congestion with headache.     HPI Patient is in today for symptoms began Wednesday night with sore throat. Then developed cough and HA/nasal congestion. Denies fever.  Took covid test Wednesday night and again this AM.  She reports tmax 99. Has not tried any otc medications.    Health Maintenance Due  Topic Date Due   COLONOSCOPY (Pts 45-57yr Insurance coverage will need to be confirmed)  Never done   INFLUENZA VACCINE  05/05/2021    Past Medical History:  Diagnosis Date   AC (acromioclavicular) arthritis 05/21/2015   Acute pain of right shoulder 05/21/2015   Allergy    Anemia    B12 deficiency    Back pain    Biceps tendonitis on right 05/21/2015   Compound nevus 01/15/2014   Depression    Gallbladder problem    Hypermetropia of both eyes 09/12/2014   Impingement syndrome of right shoulder 05/21/2015   Joint pain    Lactose intolerance    Morbid obesity ([place] 08/15/2017   Multiple food allergies    Nevus of choroid of left eye 09/12/2014   Palpitations    Psoriasis    Seborrheic dermatitis 02/08/2015   Soft tissue mass    left flank area   Status post gastric bypass for obesity 12/08/2016   Swelling    Vitamin B 12 deficiency 12/08/2016   Vitamin D deficiency     Past Surgical History:  Procedure Laterality Date   CHOLECYSTECTOMY  2006   COLPOSCOPY  2008   ENDOMETRIAL BIOPSY  2017   negative per pt   GASTRIC BYPASS  2007   MASS EXCISION Left 08/17/2017   Procedure: EXCISION SOFT TISSUE  MASS LEFT FLANK;  Surgeon: GArmandina Gemma MD;  [facility]  Service: General;  Laterality: Left;    Family History  Problem Relation Age of Onset   Hypertension Mother    Obesity Mother     Hypertension Father    Sleep apnea Father    Obesity Father    Alzheimer's disease Maternal Grandmother    Leukemia Maternal Grandfather    Alzheimer's disease Paternal Grandmother    Cancer Paternal Grandfather    Diabetes type II Maternal Aunt    Stroke Maternal Aunt    Multiple sclerosis Maternal Aunt    Heart attack Paternal Aunt     Social History   Socioeconomic History   Marital status: Single    Spouse name: Not on file   Number of children: Not on file   Years of education: Not on file   Highest education level: Not on file  Occupational History   Occupation: RProgrammer, multimedia [employer]  Tobacco Use   Smoking status: Never   Smokeless tobacco: Never  Substance and Sexual Activity   Alcohol use: Yes    Comment: 2-3 glasses a week   Drug use: No   Sexual activity: Yes    Birth control/protection: Pill  Other Topics Concern   Not on file  Social History Narrative   Works as a HEngineer, maintenanceat [employer]  No children   Not married   Has a dMagazine features editor  named Lorre Nick   From [place]- moved to [place] in 2002   Parents are both living   Has one sister back home   Enjoys going out to eat with friends   Enjoys biking and kayaking.    Goes to the GYM 6 days a week   Social Determinants of Radio broadcast assistant Strain: Not on file  Food Insecurity: Not on file  Transportation Needs: Not on file  Physical Activity: Not on file  Stress: Not on file  Social Connections: Not on file  Intimate Partner Violence: Not on file    Outpatient Medications Prior to Visit  Medication Sig Dispense Refill   buPROPion (WELLBUTRIN XL) 150 MG 24 hr tablet TAKE 1 TABLET (150 MG TOTAL) BY MOUTH DAILY. (Patient taking differently: Take 150 mg by mouth daily.) 90 tablet 1   clobetasol (TEMOVATE) 0.05 % external solution APPLY 1 APPLICATION ON TO THE SKIN 2 TIMES DAILY (Patient taking differently: Apply 1 application topically 2 (two) times daily.) 50 mL 3   COVID-19 mRNA vaccine,  Pfizer, 30 MCG/0.3ML injection INJECT AS DIRECTED (Patient taking differently: Inject 0.3 mLs into the muscle once.) .3 mL 0   cyanocobalamin (,VITAMIN B-12,) 1000 MCG/ML injection INJECT 1 ML INTO THE MUSCLE EVERY 30 DAYS (Patient taking differently: Inject 1,000 mcg into the muscle every 30 (thirty) days.) 12 mL 4   diltiazem (CARDIZEM CD) 180 MG 24 hr capsule Take 1 capsule (180 mg total) by mouth daily. 90 capsule 1   Ferrous Sulfate (IRON) 325 (65 Fe) MG TABS Take 1 tablet (325 mg total) by mouth every other day. 30 tablet 0   folic acid (FOLVITE) 1 MG tablet Take 1 mg by mouth daily.     norethindrone-ethinyl estradiol-iron (MICROGESTIN FE 1.5/30) 1.5-30 MG-MCG tablet Take 1 tablet by mouth daily. 84 tablet 4   SYRINGE-NEEDLE, DISP, 3 ML 23G X 1-1/2" 3 ML MISC USE AS DIRECTED (Patient taking differently: 1 application by Other route See admin instructions. Use for B 12) 12 each 1   valACYclovir (VALTREX) 1000 MG tablet TAKE 2 TABLETS BY MOUTH NOW AND 2 TABLETS BY MOUTH IN 12 HOURS FOR COLD SORE (Patient taking differently: Take 1,000 mg by mouth as needed (Cold sore).) 12 tablet 4   No facility-administered medications prior to visit.    Allergies  Allergen Reactions   Almond Oil Swelling   Apple Swelling   Kiwi Extract Swelling   Soy Allergy Other (See Comments)   Strawberry Extract Other (See Comments)    ROS See HPI    Objective:    Physical Exam Constitutional:      General: She is not in acute distress.    Appearance: Normal appearance. She is well-developed.  HENT:     Head: Normocephalic and atraumatic.     Right Ear: Tympanic membrane, ear canal and external ear normal.     Left Ear: Tympanic membrane, ear canal and external ear normal.     Mouth/Throat:     Mouth: Mucous membranes are moist.     Pharynx: Oropharynx is clear. Uvula midline. No pharyngeal swelling.     Tonsils: No tonsillar exudate or tonsillar abscesses.  Eyes:     General: No scleral  icterus. Neck:     Thyroid: No thyromegaly.  Cardiovascular:     Rate and Rhythm: Normal rate and regular rhythm.     Heart sounds: Normal heart sounds. No murmur heard. Pulmonary:     Effort: Pulmonary effort is  normal. No respiratory distress.     Breath sounds: Normal breath sounds. No wheezing.  Musculoskeletal:     Cervical back: Neck supple.  Skin:    General: Skin is warm and dry.  Neurological:     Mental Status: She is alert and oriented to person, place, and time.  Psychiatric:        Mood and Affect: Mood normal.        Behavior: Behavior normal.        Thought Content: Thought content normal.        Judgment: Judgment normal.    BP (!) 116/46 (BP Location: Right Arm, Patient Position: Sitting, Cuff Size: Large)   Pulse 64   Temp 97.8 F (36.6 C) (Oral)   Resp 16   Ht '5\' 5"'$  (1.651 m)   Wt 225 lb (102.1 kg)   SpO2 100%   BMI 37.44 kg/m  Wt Readings from Last 3 Encounters:  05/06/21 225 lb (102.1 kg)  04/09/21 230 lb (104.3 kg)  03/12/21 225 lb (102.1 kg)       Assessment & Plan:   Problem List Items Addressed This Visit       Unprioritized   Bronchitis - Primary    New. I did send a covid PCR swab.  Advised pt as follows:    Please begin zpak and as needed tessalon. You can use nasal saline spray and mucinex as needed for congestion. Let us know if you develop worsening symptoms or if symptoms are not improved in 3-4 days.        Other Visit Diagnoses     Nasal congestion       Relevant Orders   Novel Coronavirus, NAA ([facility])       I am having Emily Sous "Kim" start on azithromycin and benzonatate. I am also having her maintain her folic acid, norethindrone-ethinyl estradiol-iron, Iron, buPROPion, SYRINGE-NEEDLE (DISP) 3 ML, valACYclovir, cyanocobalamin, clobetasol, COVID-19 mRNA vaccine (Pfizer), and diltiazem.  Meds ordered this encounter  Medications   azithromycin (ZITHROMAX) 250 MG tablet    Sig: Take 2 tablets by mouth on day  1, then 1 tablet daily on days 2 through 5    Dispense:  6 tablet    Refill:  0    Order Specific Question:   Supervising Provider    Answer:   Penni Homans A [[id number]]   benzonatate (TESSALON) 100 MG capsule    Sig: Take 1 capsule (100 mg total) by mouth 2 (two) times daily as needed for cough.    Dispense:  20 capsule    Refill:  0    Order Specific Question:   Supervising Provider    Answer:   Penni Homans A [id number]

## 2021-05-06 NOTE — Patient Instructions (Signed)
Please begin zpak and as needed tessalon. You can use nasal saline spray and mucinex as needed for congestion. Let us know if you develop worsening symptoms or if symptoms are not improved in 3-4 days.

## 2021-05-07 LAB — SARS-COV-2, NAA 2 DAY TAT

## 2021-05-07 LAB — NOVEL CORONAVIRUS, NAA: SARS-CoV-2, NAA: NOT DETECTED

## 2021-05-15 ENCOUNTER — Other Ambulatory Visit (HOSPITAL_BASED_OUTPATIENT_CLINIC_OR_DEPARTMENT_OTHER): Payer: Self-pay

## 2021-05-22 ENCOUNTER — Other Ambulatory Visit: Payer: Self-pay

## 2021-05-22 DIAGNOSIS — M79672 Pain in left foot: Secondary | ICD-10-CM

## 2021-05-22 NOTE — Progress Notes (Signed)
Referral for podiatry placed for patient.

## 2021-05-29 ENCOUNTER — Ambulatory Visit (INDEPENDENT_AMBULATORY_CARE_PROVIDER_SITE_OTHER): Payer: No Typology Code available for payment source | Admitting: Podiatry

## 2021-05-29 ENCOUNTER — Other Ambulatory Visit: Payer: Self-pay

## 2021-05-29 DIAGNOSIS — M7672 Peroneal tendinitis, left leg: Secondary | ICD-10-CM

## 2021-05-29 MED ORDER — TRIAMCINOLONE ACETONIDE 40 MG/ML IJ SUSP
20.0000 mg | Freq: Once | INTRAMUSCULAR | Status: AC
  Administered 2021-05-29: 20 mg

## 2021-05-29 NOTE — Progress Notes (Signed)
Subjective:  Patient ID: Emily [name], female    DOB: 09-06-1976,  MRN: [id number] HPI Chief Complaint  Patient presents with   Foot Pain    Left foot pain lateral aspect 4 mo duration, no known injuries, worse with activity.     45 y.o. female presents with the above complaint.   ROS: Denies fever chills nausea vomiting muscle aches pains calf pain back pain chest pain shortness of breath.  Past Medical History:  Diagnosis Date   AC (acromioclavicular) arthritis 05/21/2015   Acute pain of right shoulder 05/21/2015   Allergy    Anemia    B12 deficiency    Back pain    Biceps tendonitis on right 05/21/2015   Compound nevus 01/15/2014   Depression    Gallbladder problem    Hypermetropia of both eyes 09/12/2014   Impingement syndrome of right shoulder 05/21/2015   Joint pain    Lactose intolerance    Morbid obesity ([place]) 08/15/2017   Multiple food allergies    Nevus of choroid of left eye 09/12/2014   Palpitations    Psoriasis    Seborrheic dermatitis 02/08/2015   Soft tissue mass    left flank area   Status post gastric bypass for obesity 12/08/2016   Swelling    Vitamin B 12 deficiency 12/08/2016   Vitamin D deficiency    Past Surgical History:  Procedure Laterality Date   CHOLECYSTECTOMY  2006   COLPOSCOPY  2008   ENDOMETRIAL BIOPSY  2017   negative per pt   GASTRIC BYPASS  2007   MASS EXCISION Left 08/17/2017   Procedure: EXCISION SOFT TISSUE  MASS LEFT FLANK;  Surgeon: [name], MD;  Location: [facility];  Service: General;  Laterality: Left;    Current Outpatient Medications:    benzonatate (TESSALON) 100 MG capsule, Take 1 capsule (100 mg total) by mouth 2 (two) times daily as needed for cough., Disp: 20 capsule, Rfl: 0   buPROPion (WELLBUTRIN XL) 150 MG 24 hr tablet, TAKE 1 TABLET (150 MG TOTAL) BY MOUTH DAILY. (Patient taking differently: Take 150 mg by mouth daily.), Disp: 90 tablet, Rfl: 1   clobetasol (TEMOVATE) 0.05 % external solution,  APPLY 1 APPLICATION ON TO THE SKIN 2 TIMES DAILY (Patient taking differently: Apply 1 application topically 2 (two) times daily.), Disp: 50 mL, Rfl: 3   COVID-19 mRNA vaccine, Pfizer, 30 MCG/0.3ML injection, INJECT AS DIRECTED (Patient taking differently: Inject 0.3 mLs into the muscle once.), Disp: .3 mL, Rfl: 0   cyanocobalamin (,VITAMIN B-12,) 1000 MCG/ML injection, INJECT 1 ML INTO THE MUSCLE EVERY 30 DAYS (Patient taking differently: Inject 1,000 mcg into the muscle every 30 (thirty) days.), Disp: 12 mL, Rfl: 4   diltiazem (CARDIZEM CD) 180 MG 24 hr capsule, Take 1 capsule (180 mg total) by mouth daily., Disp: 90 capsule, Rfl: 1   Ferrous Sulfate (IRON) 325 (65 Fe) MG TABS, Take 1 tablet (325 mg total) by mouth every other day., Disp: 30 tablet, Rfl: 0   folic acid (FOLVITE) 1 MG tablet, Take 1 mg by mouth daily., Disp: , Rfl:    norethindrone-ethinyl estradiol-iron (MICROGESTIN FE 1.5/30) 1.5-30 MG-MCG tablet, Take 1 tablet by mouth daily., Disp: 84 tablet, Rfl: 4   SYRINGE-NEEDLE, DISP, 3 ML 23G X 1-1/2" 3 ML MISC, USE AS DIRECTED (Patient taking differently: 1 application by Other route See admin instructions. Use for B 12), Disp: 12 each, Rfl: 1   valACYclovir (VALTREX) 1000 MG tablet, TAKE 2 TABLETS BY  MOUTH NOW AND 2 TABLETS BY MOUTH IN 12 HOURS FOR COLD SORE (Patient taking differently: Take 1,000 mg by mouth as needed (Cold sore).), Disp: 12 tablet, Rfl: 4  Allergies  Allergen Reactions   Almond Oil Swelling   Apple Swelling   Kiwi Extract Swelling   Soy Allergy Other (See Comments)   Strawberry Extract Other (See Comments)   Review of Systems Objective:  There were no vitals filed for this visit.  General: Well developed, nourished, in no acute distress, alert and oriented x3   Dermatological: Skin is warm, dry and supple bilateral. Nails x 10 are well maintained; remaining integument appears unremarkable at this time. There are no open sores, no preulcerative lesions, no rash  or signs of infection present.  Vascular: Dorsalis Pedis artery and Posterior Tibial artery pedal pulses are 2/4 bilateral with immedate capillary fill time. Pedal hair growth present. No varicosities and no lower extremity edema present bilateral.   Neruologic: Grossly intact via light touch bilateral. Vibratory intact via tuning fork bilateral. Protective threshold with Semmes Wienstein monofilament intact to all pedal sites bilateral. Patellar and Achilles deep tendon reflexes 2+ bilateral. No Babinski or clonus noted bilateral.   Musculoskeletal: No gross boney pedal deformities bilateral. No pain, crepitus, or limitation noted with foot and ankle range of motion bilateral. Muscular strength 5/5 in all groups tested bilateral.  She has pain on palpation at the distalmost aspect of the peroneus brevis insertion on the dorsal and proximal portion of the fifth metatarsal base.  Gait: Unassisted, Nonantalgic.    Radiographs:  Radiographs were reviewed which were taken about a month ago demonstrating no acute findings.  Assessment & Plan:   Assessment: Insertional tendinitis of the peroneus brevis left.  Plan: I injected the area today with dexamethasone local anesthetic 2 mg was utilized after Betadine skin prep tolerated procedure well without complications.  Follow-up with her in 3 to 4 weeks should this not alleviate her symptoms then we will consider MRI.     Diego Ulbricht T. [place], [place]

## 2021-06-04 ENCOUNTER — Encounter: Payer: No Typology Code available for payment source | Admitting: Family

## 2021-06-16 ENCOUNTER — Other Ambulatory Visit: Payer: Self-pay

## 2021-06-16 ENCOUNTER — Other Ambulatory Visit (HOSPITAL_BASED_OUTPATIENT_CLINIC_OR_DEPARTMENT_OTHER): Payer: Self-pay

## 2021-06-16 ENCOUNTER — Ambulatory Visit (INDEPENDENT_AMBULATORY_CARE_PROVIDER_SITE_OTHER): Payer: No Typology Code available for payment source | Admitting: Cardiology

## 2021-06-16 ENCOUNTER — Encounter: Payer: Self-pay | Admitting: Cardiology

## 2021-06-16 VITALS — BP 118/62 | HR 60 | Ht 64.0 in | Wt 239.0 lb

## 2021-06-16 DIAGNOSIS — R0600 Dyspnea, unspecified: Secondary | ICD-10-CM

## 2021-06-16 DIAGNOSIS — I471 Supraventricular tachycardia: Secondary | ICD-10-CM | POA: Diagnosis not present

## 2021-06-16 DIAGNOSIS — R002 Palpitations: Secondary | ICD-10-CM

## 2021-06-16 DIAGNOSIS — Z9884 Bariatric surgery status: Secondary | ICD-10-CM

## 2021-06-16 DIAGNOSIS — R0609 Other forms of dyspnea: Secondary | ICD-10-CM

## 2021-06-16 MED ORDER — METOPROLOL TARTRATE 25 MG PO TABS
12.5000 mg | ORAL_TABLET | Freq: Two times a day (BID) | ORAL | 3 refills | Status: DC
  Filled 2021-06-16: qty 90, 90d supply, fill #0

## 2021-06-16 NOTE — Patient Instructions (Signed)
Medication Instructions:  Your physician has recommended you make the following change in your medication:  STOP: Cardizem  After 2 days start: Metoprolol tartrate 12.5 mg twice daily *If you need a refill on your cardiac medications before your next appointment, please call your pharmacy*   Lab Work: None If you have labs (blood work) drawn today and your tests are completely normal, you will receive your results only by: [place] (if you have MyChart) OR A paper copy in the mail If you have any lab test that is abnormal or we need to change your treatment, we will call you to review the results.   Testing/Procedures: None   Follow-Up: At [facility], you and your health needs are our priority.  As part of our continuing mission to provide you with exceptional heart care, we have created designated Provider Care Teams.  These Care Teams include your primary Cardiologist (physician) and Advanced Practice Providers (APPs -  Physician Assistants and Nurse Practitioners) who all work together to provide you with the care you need, when you need it.  We recommend signing up for the patient portal called "MyChart".  Sign up information is provided on this After Visit Summary.  MyChart is used to connect with patients for Virtual Visits (Telemedicine).  Patients are able to view lab/test results, encounter notes, upcoming appointments, etc.  Non-urgent messages can be sent to your provider as well.   To learn more about what you can do with MyChart, go to [email or web address].    Your next appointment:   5 month(s)  The format for your next appointment:   In Person  Provider:   Jenne Campus, MD   Other Instructions

## 2021-06-16 NOTE — Progress Notes (Signed)
Cardiology Office Note:    Date:  06/16/2021   ID:  Emily [name], DOB 1976/07/10, MRN [id number]  PCP:  [name], NP  Cardiologist:  Jenne Campus, MD    Referring MD: [name], NP   Chief Complaint  Patient presents with   Follow-up  Doing better about her complaint being weak tired and exhausted as well as is still some palpitations  History of Present Illness:    Emily [name] is a 45 y.o. female she was sent to [name] because of palpitations.  She was discovered to have supraventricular tachycardia.  I try to put her on Cardizem [place] initially 120 of Cardizem now 180 she says she is feeling better but she complained of being weak tired and exhausted.  We did not try beta-blocker because of resting bradycardia with resting heart rate of about 67.  However I think it deserves a trial.  We did talk in length about options for the situation option being trial of beta-blocker versus antiarrhythmic therapy versus potential ablation.  I am sure we not at that stage yet but that was brought to this discussion.  Overall she is doing well.  She denies have any chest pain tightness squeezing pressure burning chest, she goes to gym on the regular basis.  No difficulty doing her exercises  Past Medical History:  Diagnosis Date   AC (acromioclavicular) arthritis 05/21/2015   Acute pain of right shoulder 05/21/2015   Allergy    Anemia    B12 deficiency    Back pain    Biceps tendonitis on right 05/21/2015   Compound nevus 01/15/2014   Depression    Gallbladder problem    Hypermetropia of both eyes 09/12/2014   Impingement syndrome of right shoulder 05/21/2015   Joint pain    Lactose intolerance    Morbid obesity ([place]) 08/15/2017   Multiple food allergies    Nevus of choroid of left eye 09/12/2014   Palpitations    Psoriasis    Seborrheic dermatitis 02/08/2015   Soft tissue mass    left flank area   Status post gastric bypass for obesity 12/08/2016   Swelling     Vitamin B 12 deficiency 12/08/2016   Vitamin D deficiency     Past Surgical History:  Procedure Laterality Date   CHOLECYSTECTOMY  2006   COLPOSCOPY  2008   ENDOMETRIAL BIOPSY  2017   negative per pt   GASTRIC BYPASS  2007   MASS EXCISION Left 08/17/2017   Procedure: EXCISION SOFT TISSUE  MASS LEFT FLANK;  Surgeon: [name], MD;  Location: [facility];  Service: General;  Laterality: Left;    Current Medications: Current Meds  Medication Sig   benzonatate (TESSALON) 100 MG capsule Take 1 capsule (100 mg total) by mouth 2 (two) times daily as needed for cough.   buPROPion (WELLBUTRIN XL) 150 MG 24 hr tablet TAKE 1 TABLET (150 MG TOTAL) BY MOUTH DAILY. (Patient taking differently: Take 150 mg by mouth daily.)   clobetasol (TEMOVATE) 0.05 % external solution APPLY 1 APPLICATION ON TO THE SKIN 2 TIMES DAILY (Patient taking differently: Apply 1 application topically 2 (two) times daily.)   COVID-19 mRNA vaccine, Pfizer, 30 MCG/0.3ML injection INJECT AS DIRECTED (Patient taking differently: Inject 0.3 mLs into the muscle once.)   cyanocobalamin (,VITAMIN B-12,) 1000 MCG/ML injection INJECT 1 ML INTO THE MUSCLE EVERY 30 DAYS (Patient taking differently: Inject 1,000 mcg into the muscle every 30 (thirty) days.)   diltiazem (CARDIZEM CD) 180  MG 24 hr capsule Take 1 capsule (180 mg total) by mouth daily.   Ferrous Sulfate (IRON) 325 (65 Fe) MG TABS Take 1 tablet (325 mg total) by mouth every other day.   folic acid (FOLVITE) 1 MG tablet Take 1 mg by mouth daily.   norethindrone-ethinyl estradiol-iron (MICROGESTIN FE 1.5/30) 1.5-30 MG-MCG tablet Take 1 tablet by mouth daily.   SYRINGE-NEEDLE, DISP, 3 ML 23G X 1-1/2" 3 ML MISC USE AS DIRECTED (Patient taking differently: 1 application by Other route See admin instructions. Use for B 12)   valACYclovir (VALTREX) 1000 MG tablet TAKE 2 TABLETS BY MOUTH NOW AND 2 TABLETS BY MOUTH IN 12 HOURS FOR COLD SORE (Patient taking differently: Take  1,000 mg by mouth as needed (Cold sore).)     Allergies:   Almond oil, Apple, Kiwi extract, Soy allergy, and Strawberry extract   Social History   Socioeconomic History   Marital status: Single    Spouse name: Not on file   Number of children: Not on file   Years of education: Not on file   Highest education level: Not on file  Occupational History   Occupation: Therapist, sports    Employer: [employer]  Tobacco Use   Smoking status: Never   Smokeless tobacco: Never  Substance and Sexual Activity   Alcohol use: Yes    Comment: 2-3 glasses a week   Drug use: No   Sexual activity: Yes    Birth control/protection: Pill  Other Topics Concern   Not on file  Social History Narrative   Works as a Engineer, maintenance at [employer]   No children   Not married   Has a dog named [place]   From [place]- moved to [place] in 2002   Parents are both living   Has one sister back home   Enjoys going out to eat with friends   Enjoys biking and kayaking.    Goes to the GYM 6 days a week   Social Determinants of Radio broadcast assistant Strain: Not on file  Food Insecurity: Not on file  Transportation Needs: Not on file  Physical Activity: Not on file  Stress: Not on file  Social Connections: Not on file     Family History: The patient's family history includes Alzheimer's disease in her maternal grandmother and paternal grandmother; Cancer in her paternal grandfather; Diabetes type II in her maternal aunt; Heart attack in her paternal aunt; Hypertension in her father and mother; Leukemia in her maternal grandfather; Multiple sclerosis in her maternal aunt; Obesity in her father and mother; Sleep apnea in her father; Stroke in her maternal aunt. ROS:   Please see the history of present illness.    All 14 point review of systems negative except as described per history of present illness  EKGs/Labs/Other Studies Reviewed:      Recent Labs: 12/24/2020: BUN 21; Creatinine, Ser 0.70; Hemoglobin  11.2; Platelets 413.0; Potassium 4.5; Sodium 138; TSH 2.37  Recent Lipid Panel    Component Value Date/Time   CHOL 229 (H) 06/03/2020 0758   CHOL 200 (H) 02/07/2018 0812   TRIG 64.0 06/03/2020 0758   HDL 129.80 06/03/2020 0758   HDL 113 02/07/2018 0812   CHOLHDL 2 06/03/2020 0758   VLDL 12.8 06/03/2020 0758   LDLCALC 87 06/03/2020 0758   LDLCALC 74 02/07/2018 0812    Physical Exam:    VS:  BP 118/62 (BP Location: Right Arm, Patient Position: Sitting)   Pulse  60   Ht '5\' 4"'$  (1.626 m)   Wt 239 lb (108.4 kg)   SpO2 99%   BMI 41.02 kg/m     Wt Readings from Last 3 Encounters:  06/16/21 239 lb (108.4 kg)  05/06/21 225 lb (102.1 kg)  04/09/21 230 lb (104.3 kg)     GEN:  Well nourished, well developed in no acute distress HEENT: Normal NECK: No JVD; No carotid bruits LYMPHATICS: No lymphadenopathy CARDIAC: RRR, no murmurs, no rubs, no gallops RESPIRATORY:  Clear to auscultation without rales, wheezing or rhonchi  ABDOMEN: Soft, non-tender, non-distended MUSCULOSKELETAL:  No edema; No deformity  SKIN: Warm and dry LOWER EXTREMITIES: no swelling NEUROLOGIC:  Alert and oriented x 3 PSYCHIATRIC:  Normal affect   ASSESSMENT:    1. Supraventricular tachycardia (HCC)   2. Palpitations   3. Dyspnea on exertion   4. Status post gastric bypass for obesity    PLAN:    In order of problems listed above:  Supraventricular tachycardia ask her to stop Cardizem for about 2 days and then start small dose of beta-blocker I suggested that Toprol 12.5 twice daily I hope we will be able to control it with the lower dose of the medication overall she hopefully will feel better.  We just have to try it. Palpitations plan as described above [place] exertion stable. Status post gastric bypass surgery.  Noted. Dyslipidemia I did review K PN which show LDL of 87 HDL 129.  She is scheduled to have her fasting profile done next month   Medication Adjustments/Labs and Tests Ordered: Current  medicines are reviewed at length with the patient today.  Concerns regarding medicines are outlined above.  No orders of the defined types were placed in this encounter.  Medication changes: No orders of the defined types were placed in this encounter.   Signed, Park Liter, MD, [facility] 06/16/2021 10:44 AM    [place]

## 2021-06-16 NOTE — Addendum Note (Signed)
Addended by: Orvan July on: 06/16/2021 10:53 AM   Modules accepted: Orders

## 2021-06-18 ENCOUNTER — Encounter: Payer: Self-pay | Admitting: Family

## 2021-06-18 ENCOUNTER — Ambulatory Visit (INDEPENDENT_AMBULATORY_CARE_PROVIDER_SITE_OTHER): Payer: No Typology Code available for payment source | Admitting: Family

## 2021-06-18 ENCOUNTER — Other Ambulatory Visit: Payer: Self-pay

## 2021-06-18 VITALS — BP 112/68 | HR 83 | Resp 16 | Ht 64.0 in | Wt 233.0 lb

## 2021-06-18 DIAGNOSIS — E559 Vitamin D deficiency, unspecified: Secondary | ICD-10-CM | POA: Diagnosis not present

## 2021-06-18 DIAGNOSIS — E538 Deficiency of other specified B group vitamins: Secondary | ICD-10-CM

## 2021-06-18 DIAGNOSIS — Z0000 Encounter for general adult medical examination without abnormal findings: Secondary | ICD-10-CM

## 2021-06-18 DIAGNOSIS — E785 Hyperlipidemia, unspecified: Secondary | ICD-10-CM | POA: Diagnosis not present

## 2021-06-18 LAB — CBC WITH DIFFERENTIAL/PLATELET
Basophils Absolute: 0 10*3/uL (ref 0.0–0.1)
Basophils Relative: 0.9 % (ref 0.0–3.0)
Eosinophils Absolute: 0.1 10*3/uL (ref 0.0–0.7)
Eosinophils Relative: 2.6 % (ref 0.0–5.0)
HCT: 33.2 % — ABNORMAL LOW (ref 36.0–46.0)
Hemoglobin: 10.1 g/dL — ABNORMAL LOW (ref 12.0–15.0)
Lymphocytes Relative: 36.6 % (ref 12.0–46.0)
Lymphs Abs: 1.6 10*3/uL (ref 0.7–4.0)
MCHC: 30.3 g/dL (ref 30.0–36.0)
MCV: 72.6 fl — ABNORMAL LOW (ref 78.0–100.0)
Monocytes Absolute: 0.3 10*3/uL (ref 0.1–1.0)
Monocytes Relative: 5.9 % (ref 3.0–12.0)
Neutro Abs: 2.4 10*3/uL (ref 1.4–7.7)
Neutrophils Relative %: 54 % (ref 43.0–77.0)
Platelets: 432 10*3/uL — ABNORMAL HIGH (ref 150.0–400.0)
RBC: 4.58 Mil/uL (ref 3.87–5.11)
RDW: 18.1 % — ABNORMAL HIGH (ref 11.5–15.5)
WBC: 4.4 10*3/uL (ref 4.0–10.5)

## 2021-06-18 LAB — LIPID PANEL
Cholesterol: 196 mg/dL (ref 0–200)
HDL: 104.5 mg/dL (ref >39.00)
LDL Cholesterol: 76 mg/dL (ref 0–99)
NonHDL: 91.49
Total CHOL/HDL Ratio: 2
Triglycerides: 75 mg/dL (ref 0.0–149.0)
VLDL: 15 mg/dL (ref 0.0–40.0)

## 2021-06-18 LAB — VITAMIN D 25 HYDROXY (VIT D DEFICIENCY, FRACTURES): VITD: 27.18 ng/mL — ABNORMAL LOW (ref 30.00–100.00)

## 2021-06-18 LAB — VITAMIN B12: Vitamin B-12: 212 pg/mL (ref 211–911)

## 2021-06-18 NOTE — Patient Instructions (Signed)
Please schedule routine eye exam.  

## 2021-06-18 NOTE — Progress Notes (Signed)
Subjective:   By signing my name below, I, Emily [name], attest that this documentation has been prepared under the direction and in the presence of Emily [name]. 06/18/2021    Patient ID: Emily [name], female    DOB: Jan 22, 1976, 45 y.o.   MRN: [id number]  Chief Complaint  Patient presents with   Annual Exam    HPI Patient is in today for a comprehensive physical exam.   Blood pressure- Her blood pressure is doing well during this visit. She continues taking 12.5 mg metoprolol tartrate 2x daily PO an reports no new issue while taking it. She continues seeing her cardiologist specialist at this time.   BP Readings from Last 3 Encounters:  06/18/21 112/68  06/16/21 118/62  05/06/21 (!) 116/46   Pulse Readings from Last 3 Encounters:  06/18/21 83  06/16/21 60  05/06/21 64   Cholesterol- Her last lipid panel was last year and she is interested in getting it checked during her next lab work.   Lab Results  Component Value Date   CHOL 229 (H) 06/03/2020   HDL 129.80 06/03/2020   LDLCALC 87 06/03/2020   TRIG 64.0 06/03/2020   CHOLHDL 2 06/03/2020   Foot pain- She continues seeing her podiatrist specialist to manage her foot pain. She notes getting an injection from there that brought temporary relief.  Age spots- She reports developing new age spots in developing on her face.  Weight loss- She continues trying to loss weight  Menstrual cycle- She continues having menstrual cycles. She notes there last menstrual cycles was late. She is planning to make an appointment with her GYN specialist.  B12- She reports not being consistent with her vitamin B12 injections.  Social history- She has no recent surgeries in the past year. She reports her mothers cologaurd results were abnormal, her father found polyps in his recent colonoscopy, her maternal aunt is diagnosed with muscular dystrophy, otherwise she has no recent changes to her family medical history.  She does not  use drugs, she does not use tobacco products, she does not use vaping products.  She denies having any unexpected weight change, ear pain, hearing loss and rhinorrhea, visual disturbance, cough, chest pain and leg swelling, nausea, vomiting, diarrhea and blood in stool, or dysuria and frequency, for myalgias and arthralgias, rash, headaches, adenopathy, depression or anxiety at this time.  Immunizations: She is UTD on tetanus vaccines. She has 2 Covid-19 vaccines.  Diet: She continues maintaining a healthy diet. She continues using a weight watchers app to help control her diet. She has seen a healthy weight an wellness program but stopped due to their diet they assigned her.  Exercise: She continues exercising regularly.  Colonoscopy: Not yet completed.  Pap Smear: Last completed 06/20/2020. Results showed positive HPV. Repeat in one year.  Mammogram: Last completed 06/20/2020. Results are normal. Repeat in one year.  Dental: She is UTD on dental care.  Vision: She is due for vision care.   Health Maintenance Due  Topic Date Due   COLONOSCOPY (Pts 45-68yr Insurance coverage will need to be confirmed)  Never done   INFLUENZA VACCINE  05/05/2021    Past Medical History:  Diagnosis Date   AC (acromioclavicular) arthritis 05/21/2015   Acute pain of right shoulder 05/21/2015   Allergy    Anemia    B12 deficiency    Back pain    Biceps tendonitis on right 05/21/2015   Compound nevus 01/15/2014   Depression    Gallbladder  problem    Hypermetropia of both eyes 09/12/2014   Impingement syndrome of right shoulder 05/21/2015   Joint pain    Lactose intolerance    Morbid obesity ([name]) 08/15/2017   Multiple food allergies    Nevus of choroid of left eye 09/12/2014   Palpitations    Psoriasis    Seborrheic dermatitis 02/08/2015   Soft tissue mass    left flank area   Status post gastric bypass for obesity 12/08/2016   Swelling    Vitamin B 12 deficiency 12/08/2016   Vitamin D deficiency      Past Surgical History:  Procedure Laterality Date   CHOLECYSTECTOMY  2006   COLPOSCOPY  2008   ENDOMETRIAL BIOPSY  2017   negative per pt   GASTRIC BYPASS  2007   MASS EXCISION Left 08/17/2017   Procedure: EXCISION SOFT TISSUE  MASS LEFT FLANK;  Surgeon: [name], MD;  Location: [facility];  Service: General;  Laterality: Left;    Family History  Problem Relation Age of Onset   Hypertension Mother    Obesity Mother    Hypertension Father    Sleep apnea Father    Obesity Father    Colon polyps Father    Alzheimer's disease Maternal Grandmother    Leukemia Maternal Grandfather    Alzheimer's disease Paternal Grandmother    Cancer Paternal Grandfather    Diabetes type II Maternal Aunt    Stroke Maternal Aunt    Multiple sclerosis Maternal Aunt    Heart attack Paternal Aunt     Social History   Socioeconomic History   Marital status: Single    Spouse name: Not on file   Number of children: Not on file   Years of education: Not on file   Highest education level: Not on file  Occupational History   Occupation: Programmer, multimedia: [employer]  Tobacco Use   Smoking status: Never   Smokeless tobacco: Never  Substance and Sexual Activity   Alcohol use: Yes    Comment: 2-3 glasses a week   Drug use: No   Sexual activity: Yes    Birth control/protection: Pill  Other Topics Concern   Not on file  Social History Narrative   Works as a Engineer, maintenance at [employer]   No children   Not married   Has a dog named Burden   From [place]- moved to [place] in 2002   Parents are both living   Has one sister back home   Enjoys going out to eat with friends   Enjoys biking and kayaking.    Goes to the GYM 6 days a week   Social Determinants of Radio broadcast assistant Strain: Not on file  Food Insecurity: Not on file  Transportation Needs: Not on file  Physical Activity: Not on file  Stress: Not on file  Social Connections: Not on file  Intimate  Partner Violence: Not on file    Outpatient Medications Prior to Visit  Medication Sig Dispense Refill   buPROPion (WELLBUTRIN XL) 150 MG 24 hr tablet TAKE 1 TABLET (150 MG TOTAL) BY MOUTH DAILY. (Patient taking differently: Take 150 mg by mouth daily.) 90 tablet 1   clobetasol (TEMOVATE) 0.05 % external solution APPLY 1 APPLICATION ON TO THE SKIN 2 TIMES DAILY (Patient taking differently: Apply 1 application topically 2 (two) times daily.) 50 mL 3   cyanocobalamin (,VITAMIN B-12,) 1000 MCG/ML injection INJECT 1 ML INTO  THE MUSCLE EVERY 30 DAYS (Patient taking differently: Inject 1,000 mcg into the muscle every 30 (thirty) days.) 12 mL 4   Ferrous Sulfate (IRON) 325 (65 Fe) MG TABS Take 1 tablet (325 mg total) by mouth every other day. 30 tablet 0   folic acid (FOLVITE) 1 MG tablet Take 1 mg by mouth daily.     metoprolol tartrate (LOPRESSOR) 25 MG tablet Take 1/2 tablet (12.5 mg total) by mouth 2 (two) times daily. 90 tablet 3   norethindrone-ethinyl estradiol-iron (MICROGESTIN FE 1.5/30) 1.5-30 MG-MCG tablet Take 1 tablet by mouth daily. 84 tablet 4   SYRINGE-NEEDLE, DISP, 3 ML 23G X 1-1/2" 3 ML MISC USE AS DIRECTED (Patient taking differently: 1 application by Other route See admin instructions. Use for B 12) 12 each 1   valACYclovir (VALTREX) 1000 MG tablet TAKE 2 TABLETS BY MOUTH NOW AND 2 TABLETS BY MOUTH IN 12 HOURS FOR COLD SORE (Patient taking differently: Take 1,000 mg by mouth as needed (Cold sore).) 12 tablet 4   benzonatate (TESSALON) 100 MG capsule Take 1 capsule (100 mg total) by mouth 2 (two) times daily as needed for cough. 20 capsule 0   COVID-19 mRNA vaccine, Pfizer, 30 MCG/0.3ML injection INJECT AS DIRECTED (Patient taking differently: Inject 0.3 mLs into the muscle once.) .3 mL 0   No facility-administered medications prior to visit.    Allergies  Allergen Reactions   Almond Oil Swelling   Apple Swelling   Kiwi Extract Swelling   Soy Allergy Other (See Comments)    Strawberry Extract Other (See Comments)    Review of Systems  Constitutional:        (-)unexpected weight change (-)Adenopathy  HENT:  Negative for hearing loss.        (-)Rhinorrhea   Eyes:        (-)Visual disturbance  Respiratory:  Negative for cough.   Cardiovascular:  Negative for chest pain and leg swelling.  Gastrointestinal:  Negative for blood in stool, constipation, diarrhea, nausea and vomiting.  Genitourinary:  Negative for dysuria and frequency.  Musculoskeletal:  Negative for joint pain and myalgias.  Skin:  Negative for rash.  Neurological:  Negative for headaches.  Psychiatric/Behavioral:  Negative for depression. The patient is not nervous/anxious.       Objective:    Physical Exam Constitutional:      General: She is not in acute distress.    Appearance: Normal appearance. She is not ill-appearing.  HENT:     Head: Normocephalic and atraumatic.     Right Ear: Tympanic membrane, ear canal and external ear normal.     Left Ear: Tympanic membrane, ear canal and external ear normal.  Eyes:     Extraocular Movements: Extraocular movements intact.     Pupils: Pupils are equal, round, and reactive to light.     Comments: No nystagmus  Cardiovascular:     Rate and Rhythm: Normal rate and regular rhythm.     Heart sounds: Normal heart sounds. No murmur heard.   No gallop.  Pulmonary:     Effort: Pulmonary effort is normal. No respiratory distress.     Breath sounds: Normal breath sounds. No wheezing or rales.  Abdominal:     General: There is no distension.     Palpations: Abdomen is soft.     Tenderness: There is no abdominal tenderness. There is no guarding.  Lymphadenopathy:     Cervical: No cervical adenopathy.  Skin:    General: Skin is warm and  dry.     Comments: 5/5 strength in both upper and lower extremities  Neurological:     Mental Status: She is alert and oriented to person, place, and time.     Deep Tendon Reflexes:     Reflex Scores:       Patellar reflexes are 0 on the right side and 2+ on the left side. Psychiatric:        Behavior: Behavior normal.        Judgment: Judgment normal.    BP 112/68 (BP Location: Left Arm, Patient Position: Sitting, Cuff Size: Normal)   Pulse 83   Resp 16   Ht '5\' 4"'$  (1.626 m)   Wt 233 lb (105.7 kg)   LMP 01/13/2021   SpO2 96%   BMI 39.99 kg/m  Wt Readings from Last 3 Encounters:  06/18/21 233 lb (105.7 kg)  06/16/21 239 lb (108.4 kg)  05/06/21 225 lb (102.1 kg)       Assessment & Plan:   Problem List Items Addressed This Visit       Unprioritized   Vitamin D deficiency   Relevant Orders   Vitamin D (25 hydroxy)   Vitamin B 12 deficiency   Relevant Orders   B12   CBC with Differential/Platelet   Preventative health care - Primary    Wt Readings from Last 3 Encounters:  06/18/21 233 lb (105.7 kg)  06/16/21 239 lb (108.4 kg)  05/06/21 225 lb (102.1 kg)  Refer for mammo/colo. Recommend follow up vision exam. She will get flu shot from her employer. Tetanus and covid vaccines up to date. She will get pap tomorrow with GYN.       Relevant Orders   Ambulatory referral to Gastroenterology   MM 3D SCREEN BREAST BILATERAL   Other Visit Diagnoses     Hyperlipidemia, unspecified hyperlipidemia type       Relevant Orders   Lipid panel        No orders of the defined types were placed in this encounter.   I, Emily [name], personally preformed the services described in this documentation.  All medical record entries made by the scribe were at my direction and in my presence.  I have reviewed the chart and discharge instructions (if applicable) and agree that the record reflects my personal performance and is accurate and complete. 06/18/2021   I,Emily [name],acting as a scribe for Nance Pear, [name].,have documented all relevant documentation on the behalf of Nance Pear, [name],as directed by  Nance Pear, [name] while in the presence of Nance Pear, [name].   Nance Pear, [name]

## 2021-06-18 NOTE — Assessment & Plan Note (Addendum)
Wt Readings from Last 3 Encounters:  06/18/21 233 lb (105.7 kg)  06/16/21 239 lb (108.4 kg)  05/06/21 225 lb (102.1 kg)   Refer for mammo/colo. Recommend follow up vision exam. She will get flu shot from her employer. Tetanus and covid vaccines up to date. She will get pap tomorrow with GYN.

## 2021-06-22 ENCOUNTER — Telehealth: Payer: Self-pay | Admitting: Family

## 2021-06-22 DIAGNOSIS — E538 Deficiency of other specified B group vitamins: Secondary | ICD-10-CM

## 2021-06-22 DIAGNOSIS — E559 Vitamin D deficiency, unspecified: Secondary | ICD-10-CM

## 2021-06-22 MED ORDER — VITAMIN D (ERGOCALCIFEROL) 1.25 MG (50000 UNIT) PO CAPS
50000.0000 [IU] | ORAL_CAPSULE | ORAL | 0 refills | Status: DC
  Filled 2021-06-22: qty 12, 84d supply, fill #0

## 2021-06-22 NOTE — Telephone Encounter (Signed)
See mychart.  

## 2021-06-23 ENCOUNTER — Other Ambulatory Visit: Payer: Self-pay

## 2021-06-23 ENCOUNTER — Other Ambulatory Visit (HOSPITAL_BASED_OUTPATIENT_CLINIC_OR_DEPARTMENT_OTHER): Payer: Self-pay

## 2021-06-23 ENCOUNTER — Other Ambulatory Visit (HOSPITAL_COMMUNITY)
Admission: RE | Admit: 2021-06-23 | Discharge: 2021-06-23 | Disposition: A | Discharge status: Home / Self Care | Payer: No Typology Code available for payment source | Source: Ambulatory Visit | Attending: Obstetrics & Gynecology | Admitting: Obstetrics & Gynecology

## 2021-06-23 ENCOUNTER — Encounter: Payer: Self-pay | Admitting: Obstetrics & Gynecology

## 2021-06-23 ENCOUNTER — Ambulatory Visit: Payer: No Typology Code available for payment source | Attending: Internal Medicine

## 2021-06-23 ENCOUNTER — Ambulatory Visit (INDEPENDENT_AMBULATORY_CARE_PROVIDER_SITE_OTHER): Payer: No Typology Code available for payment source | Admitting: Obstetrics & Gynecology

## 2021-06-23 VITALS — BP 130/52 | HR 65 | Ht 64.0 in | Wt 233.0 lb

## 2021-06-23 DIAGNOSIS — Z3041 Encounter for surveillance of contraceptive pills: Secondary | ICD-10-CM | POA: Diagnosis not present

## 2021-06-23 DIAGNOSIS — Z3009 Encounter for other general counseling and advice on contraception: Secondary | ICD-10-CM

## 2021-06-23 DIAGNOSIS — B977 Papillomavirus as the cause of diseases classified elsewhere: Secondary | ICD-10-CM | POA: Insufficient documentation

## 2021-06-23 DIAGNOSIS — Z23 Encounter for immunization: Secondary | ICD-10-CM

## 2021-06-23 DIAGNOSIS — Z01419 Encounter for gynecological examination (general) (routine) without abnormal findings: Secondary | ICD-10-CM | POA: Diagnosis present

## 2021-06-23 MED ORDER — NORETHIN ACE-ETH ESTRAD-FE 1.5-30 MG-MCG PO TABS
1.0000 | ORAL_TABLET | Freq: Every day | ORAL | 4 refills | Status: DC
  Filled 2021-06-23 – 2021-07-30 (×2): qty 84, 84d supply, fill #0
  Filled 2021-10-13: qty 84, 84d supply, fill #1
  Filled 2021-12-23 – 2021-12-26 (×2): qty 84, 84d supply, fill #2
  Filled 2022-03-20: qty 84, 84d supply, fill #3
  Filled 2022-06-05 – 2022-06-09 (×3): qty 84, 84d supply, fill #4

## 2021-06-23 NOTE — Progress Notes (Signed)
   Covid-19 Vaccination Clinic  Name:  Emily [name]    MRN: [id number] DOB: 19-Mar-1976  06/23/2021  Emily [name] was observed post Covid-19 immunization for 15 minutes without incident. She was provided with Vaccine Information Sheet and instruction to access the V-Safe system.   Emily [name] was instructed to call 911 with any severe reactions post vaccine: Difficulty breathing  Swelling of face and throat  A fast heartbeat  A bad rash all over body  Dizziness and weakness

## 2021-06-23 NOTE — Progress Notes (Signed)
Subjective:     Emily [name] is a 45 y.o. female here for a routine exam.  Current complaints: no GYN complaints. No menses on continuous OCPs. No new sexual partners since last visit.      Gynecologic History Patient's last menstrual period was 01/13/2021. Contraception: OCP (estrogen/progesterone) Last Pap: 06/20/2020. Results were: normal PAP with +hrHPV  Last mammogram: 06/20/2020. Results were: normal  Obstetric History OB History  Gravida Para Term Preterm AB Living  0 0 0 0 0 0  SAB IAB Ectopic Multiple Live Births  0 0 0 0 0   The following portions of the patient's history were reviewed and updated as appropriate: allergies, current medications, past family history, past medical history, past social history, past surgical history, and problem list.  Review of Systems Pertinent items are noted in HPI.    Objective:  BP (!) 130/52   Pulse 65   Ht '5\' 4"'$  (1.626 m)   Wt 233 lb (105.7 kg)   LMP 01/13/2021   BMI 39.99 kg/m   General Appearance:    Alert, cooperative, no distress, appears stated age  Head:    Normocephalic, without obvious abnormality, atraumatic  Eyes:    conjunctiva/corneas clear, EOM's intact, both eyes  Ears:    Normal external ear canals, both ears  Nose:   Nares normal, septum midline, mucosa normal, no drainage    or sinus tenderness  Throat:   Lips, mucosa, and tongue normal; teeth and gums normal  Neck:   Supple, symmetrical, trachea midline, no adenopathy;    thyroid:  no enlargement/tenderness/nodules  Back:     Symmetric, no curvature, ROM normal, no CVA tenderness  Lungs:     Clear to auscultation bilaterally, respirations unlabored  Chest Wall:    No tenderness or deformity   Heart:    Regular rate and rhythm, S1 and S2 normal, no murmur, rub   or gallop  Breast Exam:    No tenderness, masses, or nipple abnormality  Abdomen:     Soft, non-tender, bowel sounds active all four quadrants,    no masses, no organomegaly  Genitalia:    Normal  female without lesion, discharge or tenderness   Uterus: sl enlarged.   Extremities:   Extremities normal, atraumatic, no cyanosis or edema  Pulses:   2+ and symmetric all extremities  Skin:   Skin color, texture, turgor normal, no rashes or lesions     Plan:  Emily [name] was seen today for gynecologic exam.  Diagnoses and all orders for this visit:  Well female exam with routine gynecological exam -     Cytology - PAP( [place])  High risk HPV infection -     Cytology - PAP( [place])  Encounter for counseling regarding contraception -     norethindrone-ethinyl estradiol-iron (MICROGESTIN FE 1.5/30) 1.5-30 MG-MCG tablet; Take 1 tablet by mouth daily.  Encounter for surveillance of contraceptive pills -     norethindrone-ethinyl estradiol-iron (MICROGESTIN FE 1.5/30) 1.5-30 MG-MCG tablet; Take 1 tablet by mouth daily.  Well woman exam with routine gynecological exam -     norethindrone-ethinyl estradiol-iron (MICROGESTIN FE 1.5/30) 1.5-30 MG-MCG tablet; Take 1 tablet by mouth daily.  Encounter for other general counseling or advice on contraception  Referral to GI for colon cancer screening.  Annual Mammography.     F/u in 1 year or sooner prn   Emily [name] L. Harraway-Smith, M.D., Cherlynn June

## 2021-06-25 ENCOUNTER — Other Ambulatory Visit (HOSPITAL_BASED_OUTPATIENT_CLINIC_OR_DEPARTMENT_OTHER): Payer: Self-pay

## 2021-06-26 LAB — CYTOLOGY - PAP
Comment: NEGATIVE
Diagnosis: NEGATIVE
Diagnosis: REACTIVE
High risk HPV: NEGATIVE

## 2021-06-30 ENCOUNTER — Other Ambulatory Visit (HOSPITAL_BASED_OUTPATIENT_CLINIC_OR_DEPARTMENT_OTHER): Payer: Self-pay

## 2021-06-30 MED ORDER — COVID-19MRNA BIVAL VACC PFIZER 30 MCG/0.3ML IM SUSP
INTRAMUSCULAR | 0 refills | Status: DC
  Filled 2021-06-30: qty 0.3, 1d supply, fill #0

## 2021-07-11 ENCOUNTER — Other Ambulatory Visit: Payer: Self-pay

## 2021-07-11 ENCOUNTER — Other Ambulatory Visit (HOSPITAL_BASED_OUTPATIENT_CLINIC_OR_DEPARTMENT_OTHER): Payer: Self-pay

## 2021-07-11 MED ORDER — DILTIAZEM HCL ER COATED BEADS 180 MG PO CP24
180.0000 mg | ORAL_CAPSULE | Freq: Every day | ORAL | 3 refills | Status: DC
  Filled 2021-07-11: qty 90, 90d supply, fill #0
  Filled 2021-10-13: qty 90, 90d supply, fill #1
  Filled 2022-01-23: qty 90, 90d supply, fill #2
  Filled 2022-05-06: qty 90, 90d supply, fill #3

## 2021-07-11 MED ORDER — DILTIAZEM HCL ER COATED BEADS 180 MG PO TB24
180.0000 mg | ORAL_TABLET | Freq: Every day | ORAL | 3 refills | Status: DC
  Filled 2021-07-11: qty 90, 90d supply, fill #0

## 2021-07-11 MED FILL — Cyanocobalamin Inj 1000 MCG/ML: INTRAMUSCULAR | 90 days supply | Qty: 3 | Fill #0 | Status: AC

## 2021-07-14 ENCOUNTER — Ambulatory Visit (HOSPITAL_BASED_OUTPATIENT_CLINIC_OR_DEPARTMENT_OTHER): Payer: No Typology Code available for payment source

## 2021-07-15 ENCOUNTER — Other Ambulatory Visit (HOSPITAL_BASED_OUTPATIENT_CLINIC_OR_DEPARTMENT_OTHER): Payer: Self-pay

## 2021-07-15 ENCOUNTER — Other Ambulatory Visit: Payer: Self-pay

## 2021-07-15 ENCOUNTER — Ambulatory Visit (INDEPENDENT_AMBULATORY_CARE_PROVIDER_SITE_OTHER): Payer: No Typology Code available for payment source | Admitting: Family

## 2021-07-15 VITALS — BP 124/49 | HR 69 | Temp 98.3°F | Resp 16

## 2021-07-15 DIAGNOSIS — E559 Vitamin D deficiency, unspecified: Secondary | ICD-10-CM

## 2021-07-15 DIAGNOSIS — E538 Deficiency of other specified B group vitamins: Secondary | ICD-10-CM

## 2021-07-15 DIAGNOSIS — R002 Palpitations: Secondary | ICD-10-CM

## 2021-07-15 DIAGNOSIS — H8111 Benign paroxysmal vertigo, right ear: Secondary | ICD-10-CM | POA: Diagnosis not present

## 2021-07-15 HISTORY — DX: Benign paroxysmal vertigo, right ear: H81.11

## 2021-07-15 LAB — COMPREHENSIVE METABOLIC PANEL
ALT: 29 U/L (ref 0–35)
AST: 25 U/L (ref 0–37)
Albumin: 3.7 g/dL (ref 3.5–5.2)
Alkaline Phosphatase: 69 U/L (ref 39–117)
BUN: 17 mg/dL (ref 6–23)
CO2: 22 mEq/L (ref 19–32)
Calcium: 8.5 mg/dL (ref 8.4–10.5)
Chloride: 107 mEq/L (ref 96–112)
Creatinine, Ser: 0.71 mg/dL (ref 0.40–1.20)
GFR: 101.15 mL/min (ref >60.00)
Glucose, Bld: 149 mg/dL — ABNORMAL HIGH (ref 70–99)
Potassium: 4 mEq/L (ref 3.5–5.1)
Sodium: 137 mEq/L (ref 135–145)
Total Bilirubin: 0.6 mg/dL (ref 0.2–1.2)
Total Protein: 6.5 g/dL (ref 6.0–8.3)

## 2021-07-15 LAB — TSH: TSH: 3.23 u[IU]/mL (ref 0.35–5.50)

## 2021-07-15 LAB — CBC WITH DIFFERENTIAL/PLATELET
Basophils Absolute: 0 10*3/uL (ref 0.0–0.1)
Basophils Relative: 0.5 % (ref 0.0–3.0)
Eosinophils Absolute: 0.1 10*3/uL (ref 0.0–0.7)
Eosinophils Relative: 1.8 % (ref 0.0–5.0)
HCT: 34.8 % — ABNORMAL LOW (ref 36.0–46.0)
Hemoglobin: 10.7 g/dL — ABNORMAL LOW (ref 12.0–15.0)
Lymphocytes Relative: 30.3 % (ref 12.0–46.0)
Lymphs Abs: 1.7 10*3/uL (ref 0.7–4.0)
MCHC: 30.8 g/dL (ref 30.0–36.0)
MCV: 73.3 fl — ABNORMAL LOW (ref 78.0–100.0)
Monocytes Absolute: 0.3 10*3/uL (ref 0.1–1.0)
Monocytes Relative: 4.8 % (ref 3.0–12.0)
Neutro Abs: 3.5 10*3/uL (ref 1.4–7.7)
Neutrophils Relative %: 62.6 % (ref 43.0–77.0)
Platelets: 447 10*3/uL — ABNORMAL HIGH (ref 150.0–400.0)
RBC: 4.74 Mil/uL (ref 3.87–5.11)
RDW: 17.6 % — ABNORMAL HIGH (ref 11.5–15.5)
WBC: 5.5 10*3/uL (ref 4.0–10.5)

## 2021-07-15 MED ORDER — MECLIZINE HCL 25 MG PO TABS
25.0000 mg | ORAL_TABLET | Freq: Three times a day (TID) | ORAL | 0 refills | Status: DC | PRN
  Filled 2021-07-15: qty 30, 10d supply, fill #0

## 2021-07-15 NOTE — Assessment & Plan Note (Signed)
New. Trial of meclizine 25mg  PO PRN. If symptoms worsen or fail to improve would plan referral to vestibular rehab.

## 2021-07-15 NOTE — Progress Notes (Signed)
Subjective:   By signing my name below, I, Emily [name], attest that this documentation has been prepared under the direction and in the presence of Emily Alar, NP, 07/15/2021   Patient ID: Emily [name], female    DOB: Dec 03, 1975, 45 y.o.   MRN: [id number]  Chief Complaint  Patient presents with   Dizziness    Complains of dizziness with and w/o motion.    Nausea    Complains of nausea    Palpitations    Reports having palpitations all day yesterday but none today    HPI Patient is in today for an office visit.  Dizziness: She reports dizziness that began at 8:30 this morning. She notes that it is worsened when she looks down towards the ground. She reports that all day yesterday she was experiencing palpitations and her heart rate would spike to 130 bpm. She was walking down the hall today and had to grasp the wall for stability. She had her vitals checked yesterday and today and everything was within normal range. An EKG was performed in the office today and no abnormal findings were reported.  Vertigo: She does not have vertigo but her father has a history of vertigo.   Health Maintenance Due  Topic Date Due   COLONOSCOPY (Pts 45-49yr Insurance coverage will need to be confirmed)  Never done   INFLUENZA VACCINE  05/05/2021    Past Medical History:  Diagnosis Date   AC (acromioclavicular) arthritis 05/21/2015   Acute pain of right shoulder 05/21/2015   Allergy    Anemia    B12 deficiency    Back pain    Biceps tendonitis on right 05/21/2015   Compound nevus 01/15/2014   Depression    Gallbladder problem    Hypermetropia of both eyes 09/12/2014   Impingement syndrome of right shoulder 05/21/2015   Joint pain    Lactose intolerance    Morbid obesity ([place] 08/15/2017   Multiple food allergies    Nevus of choroid of left eye 09/12/2014   Palpitations    Psoriasis    Seborrheic dermatitis 02/08/2015   Soft tissue mass    left flank area   Status post  gastric bypass for obesity 12/08/2016   Swelling    Vitamin B 12 deficiency 12/08/2016   Vitamin D deficiency     Past Surgical History:  Procedure Laterality Date   CHOLECYSTECTOMY  2006   COLPOSCOPY  2008   ENDOMETRIAL BIOPSY  2017   negative per pt   GASTRIC BYPASS  2007   MASS EXCISION Left 08/17/2017   Procedure: EXCISION SOFT TISSUE  MASS LEFT FLANK;  Surgeon: GArmandina Gemma MD;  Location: [facility]  Service: General;  Laterality: Left;    Family History  Problem Relation Age of Onset   Hypertension Mother    Obesity Mother    Hypertension Father    Sleep apnea Father    Obesity Father    Colon polyps Father    Alzheimer's disease Maternal Grandmother    Leukemia Maternal Grandfather    Alzheimer's disease Paternal Grandmother    Cancer Paternal Grandfather    Diabetes type II Maternal Aunt    Stroke Maternal Aunt    Multiple sclerosis Maternal Aunt    Heart attack Paternal Aunt     Social History   Socioeconomic History   Marital status: Single    Spouse name: Not on file   Number of children: Not on file   Years of education: Not  on file   Highest education level: Not on file  Occupational History   Occupation: Programmer, multimedia: [employer]  Tobacco Use   Smoking status: Never   Smokeless tobacco: Never  Substance and Sexual Activity   Alcohol use: Yes    Comment: 2-3 glasses a week   Drug use: No   Sexual activity: Yes    Birth control/protection: Pill  Other Topics Concern   Not on file  Social History Narrative   Works as a Engineer, maintenance at [employer]   No children   Not married   Has a dog named [place]   From [place]- moved to [place] in 2002   Parents are both living   Has one sister back home   Enjoys going out to eat with friends   Enjoys biking and kayaking.    Goes to the GYM 6 days a week   Social Determinants of Radio broadcast assistant Strain: Not on file  Food Insecurity: Not on file  Transportation Needs:  Not on file  Physical Activity: Not on file  Stress: Not on file  Social Connections: Not on file  Intimate Partner Violence: Not on file    Outpatient Medications Prior to Visit  Medication Sig Dispense Refill   buPROPion (WELLBUTRIN XL) 150 MG 24 hr tablet TAKE 1 TABLET (150 MG TOTAL) BY MOUTH DAILY. 90 tablet 1   clobetasol (TEMOVATE) 0.05 % external solution APPLY 1 APPLICATION ON TO THE SKIN 2 TIMES DAILY 50 mL 3   COVID-19 mRNA bivalent vaccine, Pfizer, injection Inject into the muscle. 0.3 mL 0   cyanocobalamin (,VITAMIN B-12,) 1000 MCG/ML injection INJECT 1 ML INTO THE MUSCLE EVERY 30 DAYS 12 mL 4   diltiazem (CARDIZEM CD) 180 MG 24 hr capsule Take 1 capsule (180 mg total) by mouth daily. 90 capsule 3   Ferrous Sulfate (IRON) 325 (65 Fe) MG TABS Take 1 tablet (325 mg total) by mouth every other day. 30 tablet 0   folic acid (FOLVITE) 1 MG tablet Take 1 mg by mouth daily.     norethindrone-ethinyl estradiol-iron (MICROGESTIN FE 1.5/30) 1.5-30 MG-MCG tablet Take 1 tablet by mouth daily. 84 tablet 4   SYRINGE-NEEDLE, DISP, 3 ML 23G X 1-1/2" 3 ML MISC USE AS DIRECTED 12 each 1   valACYclovir (VALTREX) 1000 MG tablet TAKE 2 TABLETS BY MOUTH NOW AND 2 TABLETS BY MOUTH IN 12 HOURS FOR COLD SORE 12 tablet 4   Vitamin D, Ergocalciferol, (DRISDOL) 1.25 MG (50000 UNIT) CAPS capsule Take 1 capsule (50,000 Units total) by mouth every 7 (seven) days. 12 capsule 0   No facility-administered medications prior to visit.    Allergies  Allergen Reactions   Almond Oil Swelling   Apple Swelling   Kiwi Extract Swelling   Soy Allergy Other (See Comments)   Strawberry Extract Other (See Comments)    ROS See HPI    Objective:    Physical Exam Constitutional:      General: She is not in acute distress.    Appearance: Normal appearance. She is not ill-appearing.  HENT:     Head: Normocephalic and atraumatic.     Right Ear: External ear normal.     Left Ear: External ear normal.  Eyes:      Extraocular Movements: Extraocular movements intact.     Pupils: Pupils are equal, round, and reactive to light.     Comments: (-) nystagmus  Cardiovascular:  Rate and Rhythm: Normal rate and regular rhythm.     Heart sounds: Normal heart sounds. No murmur heard.   No gallop.  Pulmonary:     Effort: Pulmonary effort is normal. No respiratory distress.     Breath sounds: Normal breath sounds. No wheezing or rales.  Lymphadenopathy:     Cervical: No cervical adenopathy.  Skin:    General: Skin is warm and dry.  Neurological:     Mental Status: She is alert and oriented to person, place, and time.     Cranial Nerves: Cranial nerves are intact.     Comments: (+) 5/5 upper and lower extremity strength (+) dix hallpilke on right  (-) dix hallpike on left (+) dizziness with sitting up   Psychiatric:        Behavior: Behavior normal.        Judgment: Judgment normal.    BP (!) 124/49 (BP Location: Left Arm, Patient Position: Sitting, Cuff Size: Large)   Pulse 69   Temp 98.3 F (36.8 C) (Oral)   Resp 16   SpO2 100%  Wt Readings from Last 3 Encounters:  06/23/21 233 lb (105.7 kg)  06/18/21 233 lb (105.7 kg)  06/16/21 239 lb (108.4 kg)       Assessment & Plan:   Problem List Items Addressed This Visit       Unprioritized   Vitamin D deficiency   Palpitations - Primary    EKG tracing is personally reviewed.  EKG notes NSR.  No acute changes.   It is likely however that the palpitations she experienced last night were likely due to SVT.  Will obtain labs as ordered and also advised pt to notify her cardiologist of her recent symptoms.       Relevant Orders   EKG 12-Lead (Completed)   Comp Met (CMET)   TSH   CBC with Differential/Platelet   Benign paroxysmal positional vertigo of right ear    New. Trial of meclizine 23m PO PRN. If symptoms worsen or fail to improve would plan referral to vestibular rehab.       B12 deficiency   Meds ordered this encounter   Medications   meclizine (ANTIVERT) 25 MG tablet    Sig: Take 1 tablet (25 mg total) by mouth 3 (three) times daily as needed for dizziness.    Dispense:  30 tablet    Refill:  0    Order Specific Question:   Supervising Provider    Answer:   BPenni HomansA [[id number]]    I, MDebbrah Alar NP, personally preformed the services described in this documentation.  All medical record entries made by the scribe were at my direction and in my presence.  I have reviewed the chart and discharge instructions (if applicable) and agree that the record reflects my personal performance and is accurate and complete. 07/15/2021  I,Emily [name],acting as a sEducation administratorfor MNance Pear NP.,have documented all relevant documentation on the behalf of MNance Pear NP,as directed by  MNance Pear NP while in the presence of MNance Pear NP.  MNance Pear NP

## 2021-07-15 NOTE — Assessment & Plan Note (Signed)
EKG tracing is personally reviewed.  EKG notes NSR.  No acute changes.   It is likely however that the palpitations she experienced last night were likely due to SVT.  Will obtain labs as ordered and also advised pt to notify her cardiologist of her recent symptoms.

## 2021-07-17 ENCOUNTER — Other Ambulatory Visit (INDEPENDENT_AMBULATORY_CARE_PROVIDER_SITE_OTHER): Payer: No Typology Code available for payment source

## 2021-07-17 DIAGNOSIS — R739 Hyperglycemia, unspecified: Secondary | ICD-10-CM

## 2021-07-17 LAB — HEMOGLOBIN A1C: Hgb A1c MFr Bld: 5.1 % (ref 4.6–6.5)

## 2021-07-17 NOTE — Addendum Note (Signed)
Addended by: Azzie Almas on: 07/17/2021 11:03 AM   Modules accepted: Orders

## 2021-07-19 ENCOUNTER — Telehealth: Payer: Self-pay | Admitting: Family

## 2021-07-19 DIAGNOSIS — D519 Vitamin B12 deficiency anemia, unspecified: Secondary | ICD-10-CM

## 2021-07-19 NOTE — Telephone Encounter (Signed)
See mychart.  

## 2021-07-24 ENCOUNTER — Ambulatory Visit (INDEPENDENT_AMBULATORY_CARE_PROVIDER_SITE_OTHER): Payer: No Typology Code available for payment source | Admitting: Podiatry

## 2021-07-24 ENCOUNTER — Other Ambulatory Visit: Payer: Self-pay

## 2021-07-24 DIAGNOSIS — S86312A Strain of muscle(s) and tendon(s) of peroneal muscle group at lower leg level, left leg, initial encounter: Secondary | ICD-10-CM

## 2021-07-24 NOTE — Progress Notes (Signed)
Emily [name] presents today chief complaint of continued pain to the fifth metatarsal base of the left foot.  States that is still exquisitely tender right here she points to the fifth metatarsal base and the peroneus brevis tendon.  Objective: Vital signs are stable alert oriented x3 there is mild edema overlying the fifth metatarsal base.  Exquisitely tender on abduction against resistance.  Tendon appears to be intact however there may be some fraying distally at its insertion site.  Assessment: Concern for a possible tear of the peroneus brevis tendon left.  Also concerned about bony edema possibly secondary to trauma.  Plan: Discussed etiology pathology and surgical therapies at this point time went ahead and requested MRI be performed on the left foot.  I will follow-up with her in once the MRI has come back.

## 2021-07-30 ENCOUNTER — Other Ambulatory Visit (HOSPITAL_BASED_OUTPATIENT_CLINIC_OR_DEPARTMENT_OTHER): Payer: Self-pay

## 2021-07-30 ENCOUNTER — Telehealth: Payer: Self-pay | Admitting: Family

## 2021-07-30 MED ORDER — TIRZEPATIDE 2.5 MG/0.5ML ~~LOC~~ SOAJ
2.5000 mg | SUBCUTANEOUS | 0 refills | Status: AC
  Filled 2021-07-30: qty 2, 28d supply, fill #0

## 2021-07-30 NOTE — Telephone Encounter (Signed)
Patient is interested in trial of Mounjaro for weight loss. We discussed risk of volume depletion in bariatric patients and that she should discontinue if she develops nausea/vomiting or diarrhea. Also discussed that it may decrease effectiveness of OCP and she is aware of that potential side effect and still wishes to proceed.

## 2021-08-01 ENCOUNTER — Telehealth: Payer: Self-pay | Admitting: *Deleted

## 2021-08-01 ENCOUNTER — Encounter: Payer: Self-pay | Admitting: Podiatry

## 2021-08-01 DIAGNOSIS — S86312A Strain of muscle(s) and tendon(s) of peroneal muscle group at lower leg level, left leg, initial encounter: Secondary | ICD-10-CM

## 2021-08-01 NOTE — Telephone Encounter (Signed)
Patient is wanting to change the location of the MRI ordered?  She would prefer [facility] (they have mobile unit) or another [facility] facility.  Please advise.

## 2021-08-01 NOTE — Telephone Encounter (Signed)
Please advise 

## 2021-08-04 NOTE — Telephone Encounter (Signed)
MRI orders sent to [facility]

## 2021-08-04 NOTE — Addendum Note (Signed)
Addended by: Clovis Riley E on: 08/04/2021 09:04 AM   Modules accepted: Orders

## 2021-08-06 NOTE — Telephone Encounter (Signed)
Melissa with [facility] called and stated that Ms. Gibbins will need a pre-auth for MRI on her left ankle on 11/05. There is a 48 hour window, which it will definitely need to be done by tomorrow 11/03

## 2021-08-07 NOTE — Addendum Note (Signed)
Addended by: Clovis Riley E on: 08/07/2021 11:50 AM   Modules accepted: Orders

## 2021-08-07 NOTE — Telephone Encounter (Signed)
Orders for MRI placed back at [facility]

## 2021-08-07 NOTE — Telephone Encounter (Signed)
[facility] [phone]  Dennard Schaumann #[id number] [phone] valid 08/06/2021 - 09/05/2021  This authorization is for [facility] only.

## 2021-08-08 ENCOUNTER — Ambulatory Visit
Admission: RE | Admit: 2021-08-08 | Discharge: 2021-08-08 | Disposition: A | Discharge status: Home / Self Care | Payer: No Typology Code available for payment source | Source: Ambulatory Visit | Attending: Podiatry | Admitting: Podiatry

## 2021-08-08 DIAGNOSIS — S86312A Strain of muscle(s) and tendon(s) of peroneal muscle group at lower leg level, left leg, initial encounter: Secondary | ICD-10-CM

## 2021-08-09 ENCOUNTER — Ambulatory Visit (HOSPITAL_BASED_OUTPATIENT_CLINIC_OR_DEPARTMENT_OTHER): Payer: No Typology Code available for payment source

## 2021-08-12 ENCOUNTER — Other Ambulatory Visit (HOSPITAL_BASED_OUTPATIENT_CLINIC_OR_DEPARTMENT_OTHER): Payer: Self-pay

## 2021-08-12 ENCOUNTER — Encounter (HOSPITAL_BASED_OUTPATIENT_CLINIC_OR_DEPARTMENT_OTHER): Payer: Self-pay

## 2021-08-12 ENCOUNTER — Other Ambulatory Visit: Payer: Self-pay

## 2021-08-12 ENCOUNTER — Ambulatory Visit (HOSPITAL_BASED_OUTPATIENT_CLINIC_OR_DEPARTMENT_OTHER)
Admission: RE | Admit: 2021-08-12 | Discharge: 2021-08-12 | Disposition: A | Discharge status: Home / Self Care | Payer: No Typology Code available for payment source | Source: Ambulatory Visit | Attending: Family | Admitting: Family

## 2021-08-12 ENCOUNTER — Encounter: Payer: Self-pay | Admitting: Podiatry

## 2021-08-12 DIAGNOSIS — Z0000 Encounter for general adult medical examination without abnormal findings: Secondary | ICD-10-CM | POA: Insufficient documentation

## 2021-08-12 DIAGNOSIS — Z1231 Encounter for screening mammogram for malignant neoplasm of breast: Secondary | ICD-10-CM | POA: Insufficient documentation

## 2021-08-12 MED ORDER — BUPROPION HCL ER (XL) 150 MG PO TB24
ORAL_TABLET | Freq: Every day | ORAL | 1 refills | Status: DC
  Filled 2021-08-12: qty 90, 90d supply, fill #0
  Filled 2021-11-07: qty 90, 90d supply, fill #1

## 2021-08-13 ENCOUNTER — Other Ambulatory Visit: Payer: No Typology Code available for payment source

## 2021-08-21 ENCOUNTER — Ambulatory Visit (INDEPENDENT_AMBULATORY_CARE_PROVIDER_SITE_OTHER): Payer: No Typology Code available for payment source | Admitting: Podiatry

## 2021-08-21 ENCOUNTER — Encounter: Payer: Self-pay | Admitting: Podiatry

## 2021-08-21 ENCOUNTER — Other Ambulatory Visit: Payer: Self-pay

## 2021-08-21 DIAGNOSIS — S86312D Strain of muscle(s) and tendon(s) of peroneal muscle group at lower leg level, left leg, subsequent encounter: Secondary | ICD-10-CM | POA: Diagnosis not present

## 2021-08-22 ENCOUNTER — Other Ambulatory Visit (HOSPITAL_BASED_OUTPATIENT_CLINIC_OR_DEPARTMENT_OTHER): Payer: Self-pay

## 2021-08-22 ENCOUNTER — Ambulatory Visit (INDEPENDENT_AMBULATORY_CARE_PROVIDER_SITE_OTHER): Payer: No Typology Code available for payment source | Admitting: Family

## 2021-08-22 VITALS — BP 121/70 | HR 72 | Temp 97.9°F | Resp 16 | Wt 233.0 lb

## 2021-08-22 DIAGNOSIS — E669 Obesity, unspecified: Secondary | ICD-10-CM | POA: Diagnosis not present

## 2021-08-22 MED ORDER — TIRZEPATIDE 5 MG/0.5ML ~~LOC~~ SOAJ
5.0000 mg | SUBCUTANEOUS | 0 refills | Status: DC
  Filled 2021-08-22: qty 2, 28d supply, fill #0

## 2021-08-22 NOTE — Patient Instructions (Signed)
Please increase your Mounjaro to 5mg  once weekly. Keep up the good work with exercise.

## 2021-08-22 NOTE — Progress Notes (Signed)
Subjective:     Patient ID: Emily [name], female    DOB: [birth year], 45 y.o.   MRN: [id number]  Chief Complaint  Patient presents with   Follow-up    Here for one month follow up    HPI Patient is in today for follow up on her Mounjaro rx.  She denies any side effects.  No constipation.  Reports that she is down 10 pounds on her home scale. Reports that it has significantly improved her portion and appetite control.   Wt Readings from Last 3 Encounters:  08/22/21 233 lb (105.7 kg)  06/23/21 233 lb (105.7 kg)  06/18/21 233 lb (105.7 kg)     Health Maintenance Due  Topic Date Due   COLONOSCOPY (Pts 45-50yrs Insurance coverage will need to be confirmed)  Never done    Past Medical History:  Diagnosis Date   AC (acromioclavicular) arthritis 05/21/2015   Acute pain of right shoulder 05/21/2015   Allergy    Anemia    B12 deficiency    Back pain    Biceps tendonitis on right 05/21/2015   Compound nevus 01/15/2014   Depression    Gallbladder problem    Hypermetropia of both eyes 09/12/2014   Impingement syndrome of right shoulder 05/21/2015   Joint pain    Lactose intolerance    Morbid obesity ([place]) 08/15/2017   Multiple food allergies    Nevus of choroid of left eye 09/12/2014   Palpitations    Psoriasis    Seborrheic dermatitis 02/08/2015   Soft tissue mass    left flank area   Status post gastric bypass for obesity 12/08/2016   Swelling    Vitamin B 12 deficiency 12/08/2016   Vitamin D deficiency     Past Surgical History:  Procedure Laterality Date   CHOLECYSTECTOMY  2006   COLPOSCOPY  2008   ENDOMETRIAL BIOPSY  2017   negative per pt   GASTRIC BYPASS  2007   MASS EXCISION Left 08/17/2017   Procedure: EXCISION SOFT TISSUE  MASS LEFT FLANK;  Surgeon: [name], MD;  Location: [facility];  Service: General;  Laterality: Left;    Family History  Problem Relation Age of Onset   Hypertension Mother    Obesity Mother    Hypertension Father     Sleep apnea Father    Obesity Father    Colon polyps Father    Alzheimer's disease Maternal Grandmother    Leukemia Maternal Grandfather    Alzheimer's disease Paternal Grandmother    Cancer Paternal Grandfather    Diabetes type II Maternal Aunt    Stroke Maternal Aunt    Multiple sclerosis Maternal Aunt    Heart attack Paternal Aunt     Social History   Socioeconomic History   Marital status: Single    Spouse name: Not on file   Number of children: Not on file   Years of education: Not on file   Highest education level: Not on file  Occupational History   Occupation: Programmer, multimedia: [employer]  Tobacco Use   Smoking status: Never   Smokeless tobacco: Never  Substance and Sexual Activity   Alcohol use: Yes    Comment: 2-3 glasses a week   Drug use: No   Sexual activity: Yes    Birth control/protection: Pill  Other Topics Concern   Not on file  Social History Narrative   Works as a Engineer, maintenance at [employer]   No children  Not married   Has a dog named Lucy   From [place]- moved to [place] in 2002   Parents are both living   Has one sister back home   Enjoys going out to eat with friends   Enjoys biking and kayaking.    Goes to the GYM 6 days a week   Social Determinants of Radio broadcast assistant Strain: Not on file  Food Insecurity: Not on file  Transportation Needs: Not on file  Physical Activity: Not on file  Stress: Not on file  Social Connections: Not on file  Intimate Partner Violence: Not on file    Outpatient Medications Prior to Visit  Medication Sig Dispense Refill   buPROPion (WELLBUTRIN XL) 150 MG 24 hr tablet TAKE 1 TABLET (150 MG TOTAL) BY MOUTH DAILY. 90 tablet 1   clobetasol (TEMOVATE) 0.05 % external solution APPLY 1 APPLICATION ON TO THE SKIN 2 TIMES DAILY 50 mL 3   COVID-19 mRNA bivalent vaccine, Pfizer, injection Inject into the muscle. 0.3 mL 0   cyanocobalamin (,VITAMIN B-12,) 1000 MCG/ML injection INJECT 1 ML INTO THE  MUSCLE EVERY 30 DAYS 12 mL 4   diltiazem (CARDIZEM CD) 180 MG 24 hr capsule Take 1 capsule (180 mg total) by mouth daily. 90 capsule 3   Ferrous Sulfate (IRON) 325 (65 Fe) MG TABS Take 1 tablet (325 mg total) by mouth every other day. 30 tablet 0   folic acid (FOLVITE) 1 MG tablet Take 1 mg by mouth daily.     meclizine (ANTIVERT) 25 MG tablet Take 1 tablet (25 mg total) by mouth 3 (three) times daily as needed for dizziness. 30 tablet 0   norethindrone-ethinyl estradiol-iron (MICROGESTIN FE 1.5/30) 1.5-30 MG-MCG tablet Take 1 tablet by mouth daily. 84 tablet 4   SYRINGE-NEEDLE, DISP, 3 ML 23G X 1-1/2" 3 ML MISC USE AS DIRECTED 12 each 1   tirzepatide (MOUNJARO) 2.5 MG/0.5ML Pen Inject 2.5 mg into the skin once a week for 5 doses. 2 mL 0   valACYclovir (VALTREX) 1000 MG tablet TAKE 2 TABLETS BY MOUTH NOW AND 2 TABLETS BY MOUTH IN 12 HOURS FOR COLD SORE 12 tablet 4   Vitamin D, Ergocalciferol, (DRISDOL) 1.25 MG (50000 UNIT) CAPS capsule Take 1 capsule (50,000 Units total) by mouth every 7 (seven) days. 12 capsule 0   No facility-administered medications prior to visit.    Allergies  Allergen Reactions   Almond Oil Swelling   Apple Swelling   Kiwi Extract Swelling   Soy Allergy Other (See Comments)   Strawberry Extract Other (See Comments)    ROS See HPI    Objective:    Physical Exam Constitutional:      Appearance: Normal appearance.  HENT:     Head: Normocephalic and atraumatic.  Pulmonary:     Effort: Pulmonary effort is normal.  Skin:    General: Skin is warm and dry.  Neurological:     Mental Status: She is alert and oriented to person, place, and time.  Psychiatric:        Mood and Affect: Mood normal.        Behavior: Behavior normal.        Thought Content: Thought content normal.        Judgment: Judgment normal.    BP 121/70 (BP Location: Right Arm, Patient Position: Sitting, Cuff Size: Large)   Pulse 72   Temp 97.9 F (36.6 C) (Oral)   Resp 16   Wt 233  lb  (105.7 kg)   SpO2 100%   BMI 39.99 kg/m  Wt Readings from Last 3 Encounters:  08/22/21 233 lb (105.7 kg)  06/23/21 233 lb (105.7 kg)  06/18/21 233 lb (105.7 kg)       Assessment & Plan:   Problem List Items Addressed This Visit       Unprioritized   Obesity (BMI 35.0-39.9 without comorbidity) - Primary    Will increase mounjaro to 5mg  once daily. Continue healthy diet.  Unfortunately due to a peroneal tendon injury in her left foot, she cannot exercise. She will be having surgery after the new year.       Relevant Medications   tirzepatide [facility]) 5 MG/0.5ML Pen    I am having Sabas Sous "Maudie Mercury" start on tirzepatide. I am also having her maintain her folic acid, Iron, SYRINGE-NEEDLE (DISP) 3 ML, valACYclovir, cyanocobalamin, clobetasol, Vitamin D (Ergocalciferol), norethindrone-ethinyl estradiol-iron, COVID-19 mRNA bivalent vaccine ([place]), diltiazem, meclizine, tirzepatide, and buPROPion.  Meds ordered this encounter  Medications   tirzepatide (MOUNJARO) 5 MG/0.5ML Pen    Sig: Inject 5 mg into the skin once a week.    Dispense:  2 mL    Refill:  0    Order Specific Question:   Supervising Provider    Answer:   Penni Homans A [[id number]]

## 2021-08-22 NOTE — Assessment & Plan Note (Addendum)
Will increase mounjaro to 5mg  once daily. Continue healthy diet.  Unfortunately due to a peroneal tendon injury in her left foot, she cannot exercise. She will be having surgery after the new year.

## 2021-08-24 NOTE — Progress Notes (Signed)
She presents today for follow-up of her peroneus brevis tendon tear.  She states that actually seems to be worse than what it was.  Objective: Vital signs are stable she is alert oriented x3 still has severe pain on palpation medial lateral ankle just overlying the peroneus brevis tendon.  MRI findings are consistent with a short segment split tear of the peroneus brevis.  Assessment: Peroneus brevis split tear.  Plan: I highly recommended a surgical repair at this point however she states that she would like to try a more conservative approach.  We did discuss trying a cam walker for about 6 weeks.  She states that she would rather do that than surgery initially.  I will follow-up with her in 6 weeks we dispensed a cam walker today with explicit instructions.

## 2021-08-25 ENCOUNTER — Other Ambulatory Visit: Payer: No Typology Code available for payment source

## 2021-08-26 ENCOUNTER — Ambulatory Visit: Payer: No Typology Code available for payment source | Admitting: Family

## 2021-09-10 ENCOUNTER — Other Ambulatory Visit: Payer: Self-pay

## 2021-09-10 ENCOUNTER — Other Ambulatory Visit: Payer: No Typology Code available for payment source

## 2021-09-10 DIAGNOSIS — E538 Deficiency of other specified B group vitamins: Secondary | ICD-10-CM

## 2021-09-10 DIAGNOSIS — E559 Vitamin D deficiency, unspecified: Secondary | ICD-10-CM

## 2021-09-15 ENCOUNTER — Other Ambulatory Visit (INDEPENDENT_AMBULATORY_CARE_PROVIDER_SITE_OTHER): Payer: No Typology Code available for payment source

## 2021-09-15 ENCOUNTER — Other Ambulatory Visit: Payer: Self-pay

## 2021-09-15 DIAGNOSIS — E538 Deficiency of other specified B group vitamins: Secondary | ICD-10-CM | POA: Diagnosis not present

## 2021-09-15 DIAGNOSIS — E559 Vitamin D deficiency, unspecified: Secondary | ICD-10-CM

## 2021-09-15 NOTE — Addendum Note (Signed)
Addended by: Kelle Darting A on: 09/15/2021 03:20 PM   Modules accepted: Orders

## 2021-09-15 NOTE — Addendum Note (Signed)
Addended by: Kelle Darting A on: 09/15/2021 01:36 PM   Modules accepted: Orders

## 2021-09-16 ENCOUNTER — Other Ambulatory Visit (HOSPITAL_BASED_OUTPATIENT_CLINIC_OR_DEPARTMENT_OTHER): Payer: Self-pay

## 2021-09-16 ENCOUNTER — Telehealth: Payer: Self-pay | Admitting: Family

## 2021-09-16 LAB — VITAMIN B12: Vitamin B-12: 225 pg/mL (ref 211–911)

## 2021-09-16 LAB — VITAMIN D 25 HYDROXY (VIT D DEFICIENCY, FRACTURES): VITD: 39.84 ng/mL (ref 30.00–100.00)

## 2021-09-16 MED ORDER — CYANOCOBALAMIN 1000 MCG/ML IJ SOLN
1000.0000 ug | INTRAMUSCULAR | 2 refills | Status: AC
  Filled 2021-09-16 – 2021-09-22 (×4): qty 12, 84d supply, fill #0
  Filled 2022-03-20: qty 12, 84d supply, fill #1
  Filled 2022-07-01: qty 1, 7d supply, fill #2
  Filled 2022-07-02: qty 11, 77d supply, fill #2

## 2021-09-16 MED ORDER — TIRZEPATIDE 7.5 MG/0.5ML ~~LOC~~ SOAJ
7.5000 mg | SUBCUTANEOUS | 0 refills | Status: DC
  Filled 2021-09-16: qty 2, 28d supply, fill #0

## 2021-09-16 NOTE — Addendum Note (Signed)
Addended by: [name] on: 09/16/2021 02:51 PM   Modules accepted: Orders

## 2021-09-16 NOTE — Telephone Encounter (Signed)
Reviewed results with pt:   B12-start weekly injections.  Vit D-start OTC 3000 iu daily Mounjaro- No GI side effects, down 17 lbs. Would like to increase to 7.5mg  please.

## 2021-09-18 ENCOUNTER — Other Ambulatory Visit (HOSPITAL_BASED_OUTPATIENT_CLINIC_OR_DEPARTMENT_OTHER): Payer: Self-pay

## 2021-09-18 ENCOUNTER — Telehealth: Payer: Self-pay | Admitting: Family

## 2021-09-18 MED ORDER — TIRZEPATIDE 10 MG/0.5ML ~~LOC~~ SOAJ
10.0000 mg | SUBCUTANEOUS | 0 refills | Status: DC
  Filled 2021-09-18: qty 2, 28d supply, fill #0

## 2021-09-18 MED FILL — Clobetasol Propionate Soln 0.05%: CUTANEOUS | 25 days supply | Qty: 50 | Fill #0 | Status: AC

## 2021-09-18 NOTE — Telephone Encounter (Signed)
[name], RN  [name], NP Good Morning!   As predicted, Mounjaro 7.5mg  is on back order until the end of the month.  What are your thoughts on increasing to 10mg ? (They do have this dose in stock)  This appears to be what most do when the next dose is not available.  Since I haven't had GI side effects on the previous 2 doses, I feel comfortable with this as long as you do.   Talk soon,  Maudie Mercury

## 2021-09-19 ENCOUNTER — Other Ambulatory Visit (HOSPITAL_BASED_OUTPATIENT_CLINIC_OR_DEPARTMENT_OTHER): Payer: Self-pay

## 2021-09-22 ENCOUNTER — Other Ambulatory Visit (HOSPITAL_BASED_OUTPATIENT_CLINIC_OR_DEPARTMENT_OTHER): Payer: Self-pay

## 2021-10-02 ENCOUNTER — Encounter: Payer: Self-pay | Admitting: Podiatry

## 2021-10-02 ENCOUNTER — Other Ambulatory Visit: Payer: Self-pay

## 2021-10-02 ENCOUNTER — Ambulatory Visit (INDEPENDENT_AMBULATORY_CARE_PROVIDER_SITE_OTHER): Payer: No Typology Code available for payment source | Admitting: Podiatry

## 2021-10-02 DIAGNOSIS — S86312D Strain of muscle(s) and tendon(s) of peroneal muscle group at lower leg level, left leg, subsequent encounter: Secondary | ICD-10-CM

## 2021-10-02 NOTE — Progress Notes (Signed)
She presents today for surgical consult regarding her left foot.  She states that is really still very painful without her boot.  She like to have this taken care of sometime mid February.  She states that obviously insulin have to be surgically corrected especially according to the MRI and my symptoms.  Objective: Vital signs are stable alert oriented x3.  Pulses are palpable.  She has severe pain on palpation of the peroneus brevis tendon particularly about halfway down the tendon extending to the fifth metatarsal base.  There is no tenderness proximally not even around the os perineum.  Assessment: Peroneus brevis tear per MRI left.  Plan: All conservative therapies have failed to alleviate this patient's symptoms so at this point were going to consent her for a peroneal tendon repair peroneus brevis.  We did so today I will follow-up with her in the near future for surgical intervention we did however discussed the possible postop complications which may include but not limited to postop pain bleeding swelling infection recurrence need further surgery overcorrection under correction loss of digit loss of limb loss of life she understands that she will be in a cast for up to 8 weeks and I will follow-up with her soon.

## 2021-10-13 ENCOUNTER — Other Ambulatory Visit (HOSPITAL_BASED_OUTPATIENT_CLINIC_OR_DEPARTMENT_OTHER): Payer: Self-pay

## 2021-10-14 ENCOUNTER — Other Ambulatory Visit (HOSPITAL_BASED_OUTPATIENT_CLINIC_OR_DEPARTMENT_OTHER): Payer: Self-pay

## 2021-10-14 ENCOUNTER — Telehealth: Payer: Self-pay | Admitting: Family

## 2021-10-14 MED ORDER — TIRZEPATIDE 10 MG/0.5ML ~~LOC~~ SOAJ
10.0000 mg | SUBCUTANEOUS | 1 refills | Status: DC
  Filled 2021-10-14: qty 6, 84d supply, fill #0

## 2021-10-14 NOTE — Telephone Encounter (Signed)
°  Received the following message from patient:  It's about that time again for another refill of Mounjaro.  My last dose of 10mg  is this week, still no side effects.  I've already called the pharmacy downstairs to see what dosages they have in stock, and they do have the 12.5mg . Are you okay with me advancing to that mg?   Down 24 lbs.  Appetite suppression is real! Sticking mainly to lean protein and protein shakes.  And again, no GI symptoms.     Let me know your thoughts.  Thanks,  Emily [name] pt as follows:  Hi,   I think that if your appetite suppressed and you are loosing weight we should keep the current dose. If things change later, we can adjust upward.   Gaje Tennyson

## 2021-10-20 ENCOUNTER — Other Ambulatory Visit: Payer: Self-pay

## 2021-10-20 ENCOUNTER — Ambulatory Visit (INDEPENDENT_AMBULATORY_CARE_PROVIDER_SITE_OTHER): Payer: No Typology Code available for payment source | Admitting: Podiatry

## 2021-10-20 DIAGNOSIS — S86312D Strain of muscle(s) and tendon(s) of peroneal muscle group at lower leg level, left leg, subsequent encounter: Secondary | ICD-10-CM | POA: Diagnosis not present

## 2021-10-20 NOTE — Patient Instructions (Signed)

## 2021-10-22 NOTE — Progress Notes (Signed)
Subjective: 46 year old female presents the office today for surgical consultation.  She is originally scheduled with surgery with Dr. Milinda Pointer for peroneal tendon repair however she needs to have the surgery done at the hospital so she was referred to me.  She is been having ongoing pain along the left peroneal tendon towards the insertion of the fifth metatarsal base that she points to the last several months.  She has been in the boot without any significant improvement and she walks without the boot it hurts.  She wanted to her to proceed with surgery to have the fix of a split tear.  She is already been scheduled and presents today for consent with me.  Objective: AAO x3, NAD DP/PT pulses palpable bilaterally, CRT less than 3 seconds There is tenderness on the distal portion of the peroneal tendon inferior to the lateral malleolus towards the fifth metatarsal base.  There is mild edema there is no erythema or warmth.  Clinically the tendon appears to be intact.  MMT 5/5.  No other areas of discomfort.  No areas of pinpoint tenderness. No pain with calf compression, swelling, warmth, erythema  Assessment: 46 year old female with peroneal split tear  Plan: -All treatment options discussed with the patient including all alternatives, risks, complications.  -We again discussed the surgery was postoperative course.  We discussed peroneal tendon repair, calf application as well as PRP injection.  She understands the surgery and she wants to proceed. -The incision placement as well as the postoperative course was discussed with the patient. I discussed risks of the surgery which include, but not limited to, infection, bleeding, pain, swelling, need for further surgery, delayed or nonhealing, painful or ugly scar, numbness or sensation changes, over/under correction, recurrence, transfer lesions, further deformity, hardware failure, DVT/PE, loss of toe/foot. Patient understands these risks and wishes to  proceed with surgery. The surgical consent was reviewed with the patient all 3 pages were signed. No promises or guarantees were given to the outcome of the procedure. All questions were answered to the best of my ability. Before the surgery the patient was encouraged to call the office if there is any further questions. The surgery will be performed at the [facility] on an outpatient basis. -Knee scooter ordered for postoperative use  Trula Slade DPM

## 2021-10-29 ENCOUNTER — Other Ambulatory Visit: Payer: Self-pay

## 2021-10-29 ENCOUNTER — Ambulatory Visit: Payer: Self-pay | Admitting: Plastic Surgery

## 2021-10-29 ENCOUNTER — Encounter: Payer: Self-pay | Admitting: Plastic Surgery

## 2021-10-29 VITALS — BP 113/77 | HR 91 | Ht 64.0 in | Wt 222.2 lb

## 2021-10-29 DIAGNOSIS — Z411 Encounter for cosmetic surgery: Secondary | ICD-10-CM

## 2021-10-29 NOTE — Progress Notes (Signed)
Patient presents to discuss neuromodulator treatment.  She is bothered by the static and dynamic lines in the forehead, glabella and crows feet.  On exam she has some static and dynamic lines in the forehead, glabella and crows feet.  We reviewed the risks and benefits of Botox treatment and she is interested in moving forward.  36 units of Botox were drawn up and distributed throughout the forehead, glabella and crows feet.  2 injection sites in the crows feet and 2 rows for the forehead.  She tolerated this fine.  We will plan to see her for any touchups if necessary and otherwise at her next visit.  We did discuss how some of the static lines may not fully soften with the neuromodulator treatment alone.  We discussed resurfacing lasers for this which we can revisit down the line.

## 2021-11-06 ENCOUNTER — Ambulatory Visit (INDEPENDENT_AMBULATORY_CARE_PROVIDER_SITE_OTHER): Payer: Self-pay | Admitting: Plastic Surgery

## 2021-11-06 ENCOUNTER — Other Ambulatory Visit: Payer: Self-pay

## 2021-11-06 DIAGNOSIS — Z411 Encounter for cosmetic surgery: Secondary | ICD-10-CM

## 2021-11-06 NOTE — Progress Notes (Signed)
Patient presents about 2 weeks out from Botox injection.  She is generally happy with how things of turned out but does feel like she has a little bit of asymmetry in her right upper lid area.  She can open and close both eyes normally.  There may be a slight excess of skin some brow area on the right side compared to the left.  She does not have much movement left of her brows and has a little bit of movement left in the corrugator area.  Her crows feet area looks to have a nice improvement.  The forehead lines are much smoother even in the short period of time. We will plan to do a little bit less in the forehead and injected bit higher to maintain a little bit of more eyebrow motion.  She is content with this and we will not do any more treatment today.

## 2021-11-07 ENCOUNTER — Other Ambulatory Visit (HOSPITAL_BASED_OUTPATIENT_CLINIC_OR_DEPARTMENT_OTHER): Payer: Self-pay

## 2021-11-13 ENCOUNTER — Telehealth: Payer: Self-pay | Admitting: Family

## 2021-11-13 NOTE — Telephone Encounter (Signed)
See mychart.  

## 2021-11-19 ENCOUNTER — Telehealth: Payer: Self-pay | Admitting: Urology

## 2021-11-19 NOTE — Telephone Encounter (Signed)
DOS - 12/17/21  REPAIR TENDON LEFT --- 75051   [place] FOCUS EFFECTIVE DATE - 10/05/17  SPOKE WITH MARY WITH [facility] AND SHE STATED THAT CPT CODE 83358 HAS BEEN APPROVED, AUTH # 1 - [id number], GOOD FROM 12/17/21 - 01/16/22.  REF # MARY T. 11/19/21

## 2021-11-21 ENCOUNTER — Telehealth: Payer: Self-pay | Admitting: Family

## 2021-11-21 NOTE — Telephone Encounter (Signed)
Please contact pt's insurance to initiate appeal for continuation of Mounjaro. [phone]. I placed a letter in her chart as well.

## 2021-11-24 NOTE — Telephone Encounter (Signed)
On formulary until January 03, 2022. No PA needed until then.

## 2021-11-24 NOTE — Telephone Encounter (Signed)
Letter faxed. Appeal in progress.

## 2021-11-25 ENCOUNTER — Other Ambulatory Visit: Payer: Self-pay

## 2021-11-25 ENCOUNTER — Ambulatory Visit (INDEPENDENT_AMBULATORY_CARE_PROVIDER_SITE_OTHER): Payer: No Typology Code available for payment source | Admitting: Cardiology

## 2021-11-25 ENCOUNTER — Encounter: Payer: Self-pay | Admitting: Cardiology

## 2021-11-25 VITALS — BP 120/74 | HR 65 | Ht 64.0 in | Wt 219.0 lb

## 2021-11-25 DIAGNOSIS — I471 Supraventricular tachycardia: Secondary | ICD-10-CM | POA: Diagnosis not present

## 2021-11-25 DIAGNOSIS — R002 Palpitations: Secondary | ICD-10-CM

## 2021-11-25 NOTE — Patient Instructions (Signed)
Medication Instructions:  Your physician recommends that you continue on your current medications as directed. Please refer to the Current Medication list given to you today.  *If you need a refill on your cardiac medications before your next appointment, please call your pharmacy*   Lab Work: None If you have labs (blood work) drawn today and your tests are completely normal, you will receive your results only by: [place] (if you have MyChart) OR A paper copy in the mail If you have any lab test that is abnormal or we need to change your treatment, we will call you to review the results.   Testing/Procedures: None   Follow-Up: At [facility], you and your health needs are our priority.  As part of our continuing mission to provide you with exceptional heart care, we have created designated Provider Care Teams.  These Care Teams include your primary Cardiologist (physician) and Advanced Practice Providers (APPs -  Physician Assistants and Nurse Practitioners) who all work together to provide you with the care you need, when you need it.  We recommend signing up for the patient portal called "MyChart".  Sign up information is provided on this After Visit Summary.  MyChart is used to connect with patients for Virtual Visits (Telemedicine).  Patients are able to view lab/test results, encounter notes, upcoming appointments, etc.  Non-urgent messages can be sent to your provider as well.   To learn more about what you can do with MyChart, go to [email or web address].    Your next appointment:   1 year(s)  The format for your next appointment:   In Person  Provider:   Jenne Campus, MD    Other Instructions None

## 2021-11-25 NOTE — Progress Notes (Signed)
Cardiology Office Note:    Date:  11/25/2021   ID:  Emily [name], DOB [birth year], MRN [id number]  PCP:  [name], NP  Cardiologist:  Jenne Campus, MD    Referring MD: [name], NP   No chief complaint on file. Doing fine cardiac wise but I do have a problem my food and I required surgery  History of Present Illness:    Emily [name] is a 46 y.o. female with past medical history significant for supraventricular tachycardia.  She also have resting bradycardia but not critical.  Supraventricular tachycardia has been managed with small dose of calcium channel blocker with good effect.  She did sustain some injury to the left foot.  And she required surgery which will be scheduled 12/17/2020.  She denies have any cardiac complaint very rare episodes of palpitation not short and before.  She reports having it maybe once a month she is very happy where she is.  No chest pain tightness squeezing pressure chest no shortness of breath  Past Medical History:  Diagnosis Date   AC (acromioclavicular) arthritis 05/21/2015   Acute pain of right shoulder 05/21/2015   Allergy    Anemia    B12 deficiency    Back pain    Biceps tendonitis on right 05/21/2015   Compound nevus 01/15/2014   Depression    Gallbladder problem    Hypermetropia of both eyes 09/12/2014   Impingement syndrome of right shoulder 05/21/2015   Joint pain    Lactose intolerance    Morbid obesity ([place]) 08/15/2017   Multiple food allergies    Nevus of choroid of left eye 09/12/2014   Palpitations    Psoriasis    Seborrheic dermatitis 02/08/2015   Soft tissue mass    left flank area   Status post gastric bypass for obesity 12/08/2016   Swelling    Vitamin B 12 deficiency 12/08/2016   Vitamin D deficiency     Past Surgical History:  Procedure Laterality Date   CHOLECYSTECTOMY  2006   COLPOSCOPY  2008   ENDOMETRIAL BIOPSY  2017   negative per pt   GASTRIC BYPASS  2007   MASS EXCISION Left 08/17/2017    Procedure: EXCISION SOFT TISSUE  MASS LEFT FLANK;  Surgeon: [name], MD;  Location: [facility];  Service: General;  Laterality: Left;    Current Medications: Current Meds  Medication Sig   buPROPion (WELLBUTRIN XL) 150 MG 24 hr tablet TAKE 1 TABLET (150 MG TOTAL) BY MOUTH DAILY.   COVID-19 mRNA bivalent vaccine, Pfizer, injection Inject into the muscle.   cyanocobalamin (,VITAMIN B-12,) 1000 MCG/ML injection Inject 1 mL (1,000 mcg total) into the muscle once a week.   diltiazem (CARDIZEM CD) 180 MG 24 hr capsule Take 1 capsule (180 mg total) by mouth daily.   Ferrous Sulfate (IRON) 325 (65 Fe) MG TABS Take 1 tablet (325 mg total) by mouth every other day.   folic acid (FOLVITE) 1 MG tablet Take 1 mg by mouth daily.   meclizine (ANTIVERT) 25 MG tablet Take 1 tablet (25 mg total) by mouth 3 (three) times daily as needed for dizziness.   norethindrone-ethinyl estradiol-iron (MICROGESTIN FE 1.5/30) 1.5-30 MG-MCG tablet Take 1 tablet by mouth daily.   tirzepatide (MOUNJARO) 10 MG/0.5ML Pen Inject 10 mg into the skin once a week.   Vitamin D, Ergocalciferol, (DRISDOL) 1.25 MG (50000 UNIT) CAPS capsule Take 1 capsule (50,000 Units total) by mouth every 7 (seven) days.     Allergies:  Almond oil, Apple juice, Kiwi extract, Soy allergy, and Strawberry extract   Social History   Socioeconomic History   Marital status: Single    Spouse name: Not on file   Number of children: Not on file   Years of education: Not on file   Highest education level: Not on file  Occupational History   Occupation: Therapist, sports    Employer: [employer]  Tobacco Use   Smoking status: Never    Passive exposure: Never   Smokeless tobacco: Never  Vaping Use   Vaping Use: Never used  Substance and Sexual Activity   Alcohol use: Yes    Comment: 2-3 glasses a week   Drug use: No   Sexual activity: Yes    Birth control/protection: Pill  Other Topics Concern   Not on file  Social History Narrative    Works as a Engineer, maintenance at [employer]   No children   Not married   Has a dog named [place]   From [place]- moved to [place] in 2002   Parents are both living   Has one sister back home   Enjoys going out to eat with friends   Enjoys biking and kayaking.    Goes to the GYM 6 days a week   Social Determinants of Radio broadcast assistant Strain: Not on file  Food Insecurity: Not on file  Transportation Needs: Not on file  Physical Activity: Not on file  Stress: Not on file  Social Connections: Not on file     Family History: The patient's family history includes Alzheimer's disease in her maternal grandmother and paternal grandmother; Cancer in her paternal grandfather; Colon polyps in her father; Diabetes type II in her maternal aunt; Heart attack in her paternal aunt; Hypertension in her father and mother; Leukemia in her maternal grandfather; Multiple sclerosis in her maternal aunt; Obesity in her father and mother; Sleep apnea in her father; Stroke in her maternal aunt. ROS:   Please see the history of present illness.    All 14 point review of systems negative except as described per history of present illness  EKGs/Labs/Other Studies Reviewed:      Recent Labs: 07/15/2021: ALT 29; BUN 17; Creatinine, Ser 0.71; Hemoglobin 10.7; Platelets 447.0; Potassium 4.0; Sodium 137; TSH 3.23  Recent Lipid Panel    Component Value Date/Time   CHOL 196 06/18/2021 0805   CHOL 200 (H) 02/07/2018 0812   TRIG 75.0 06/18/2021 0805   HDL 104.50 06/18/2021 0805   HDL 113 02/07/2018 0812   CHOLHDL 2 06/18/2021 0805   VLDL 15.0 06/18/2021 0805   LDLCALC 76 06/18/2021 0805   LDLCALC 74 02/07/2018 0812    Physical Exam:    VS:  BP 120/74 (BP Location: Right Arm)    Pulse 65    Ht 5\' 4"  (1.626 m)    Wt 219 lb (99.3 kg)    SpO2 99%    BMI 37.59 kg/m     Wt Readings from Last 3 Encounters:  11/25/21 219 lb (99.3 kg)  10/29/21 222 lb 3.2 oz (100.8 kg)  08/22/21 233 lb (105.7 kg)      GEN:  Well nourished, well developed in no acute distress HEENT: Normal NECK: No JVD; No carotid bruits LYMPHATICS: No lymphadenopathy CARDIAC: RRR, no murmurs, no rubs, no gallops RESPIRATORY:  Clear to auscultation without rales, wheezing or rhonchi  ABDOMEN: Soft, non-tender, non-distended MUSCULOSKELETAL:  No edema; No deformity  SKIN: Warm and dry  LOWER EXTREMITIES: no swelling NEUROLOGIC:  Alert and oriented x 3 PSYCHIATRIC:  Normal affect   ASSESSMENT:    1. Supraventricular tachycardia (HCC)   2. Palpitations   3. Morbid obesity ([place])    PLAN:    In order of problems listed above:  Supraventricular tachycardia successfully suppressed with calcium channel blocker which I will continue. Palpitations almost completely suppressed she is happy she does not want to increase the dose of medication which is fine with me Obesity status post gastric bypass surgery still struggling with this but overall doing well hopefully after food surgery should be able to go back to her exercise routine. Cholesterol again analyzed with the patient I did review data from [facility] which showing LDL of 76 HDL 104.  Does not require any treatment.   Medication Adjustments/Labs and Tests Ordered: Current medicines are reviewed at length with the patient today.  Concerns regarding medicines are outlined above.  No orders of the defined types were placed in this encounter.  Medication changes: No orders of the defined types were placed in this encounter.   Signed, Park Liter, MD, [facility] 11/25/2021 10:28 AM    [place]

## 2021-11-25 NOTE — Telephone Encounter (Signed)
Noted  

## 2021-12-03 ENCOUNTER — Telehealth: Payer: Self-pay

## 2021-12-03 ENCOUNTER — Other Ambulatory Visit (HOSPITAL_BASED_OUTPATIENT_CLINIC_OR_DEPARTMENT_OTHER): Payer: Self-pay

## 2021-12-03 ENCOUNTER — Ambulatory Visit (INDEPENDENT_AMBULATORY_CARE_PROVIDER_SITE_OTHER): Payer: No Typology Code available for payment source | Admitting: Family

## 2021-12-03 VITALS — BP 104/36 | HR 82 | Temp 98.2°F | Resp 18 | Ht 64.0 in | Wt 216.0 lb

## 2021-12-03 DIAGNOSIS — S86312A Strain of muscle(s) and tendon(s) of peroneal muscle group at lower leg level, left leg, initial encounter: Secondary | ICD-10-CM | POA: Insufficient documentation

## 2021-12-03 MED ORDER — TIRZEPATIDE 12.5 MG/0.5ML ~~LOC~~ SOAJ
12.5000 mg | SUBCUTANEOUS | 1 refills | Status: DC
  Filled 2021-12-03: qty 2, 28d supply, fill #0
  Filled 2021-12-23 – 2022-01-19 (×2): qty 2, 28d supply, fill #1
  Filled 2022-02-17 – 2022-02-18 (×2): qty 2, 28d supply, fill #2

## 2021-12-03 NOTE — Assessment & Plan Note (Signed)
Scheduled for upcoming surgery. Management per Podiatry.  ?

## 2021-12-03 NOTE — Progress Notes (Signed)
Subjective:   By signing my name below, I, Emily [name], attest that this documentation has been prepared under the direction and in the presence of Emily Alar, NP. 12/03/2021    Patient ID: Emily [name], female    DOB: Mar 01, 1976, 46 y.o.   MRN: [id number]  Chief Complaint  Patient presents with   Advice Only    Surgical clearance    HPI Patient is in today for a office visit.   Surgery- She is requesting forms filled for her upcomming surgical procedure (left peroneal tendon repair) in 2 weeks. She completed an EKG at cardiology last week. Weight- Her max weight prior to starting 10 mg/0.5 ml mounjaro injections was 230 lb's and this morning she weight 213 lb's. Her appetite suppression has improved but she recently started having cravings again. She denies having any nausea or constipation.  Wt Readings from Last 3 Encounters:  12/03/21 216 lb (98 kg)  11/25/21 219 lb (99.3 kg)  10/29/21 222 lb 3.2 oz (100.8 kg)    Health Maintenance Due  Topic Date Due   COLONOSCOPY (Pts 45-16yrs Insurance coverage will need to be confirmed)  Never done    Past Medical History:  Diagnosis Date   AC (acromioclavicular) arthritis 05/21/2015   Acute pain of right shoulder 05/21/2015   Allergy    Anemia    B12 deficiency    Back pain    Biceps tendonitis on right 05/21/2015   Compound nevus 01/15/2014   Depression    Gallbladder problem    Hypermetropia of both eyes 09/12/2014   Impingement syndrome of right shoulder 05/21/2015   Joint pain    Lactose intolerance    Morbid obesity (Emily [name]) 08/15/2017   Multiple food allergies    Nevus of choroid of left eye 09/12/2014   Palpitations    Psoriasis    Seborrheic dermatitis 02/08/2015   Soft tissue mass    left flank area   Status post gastric bypass for obesity 12/08/2016   Swelling    Vitamin B 12 deficiency 12/08/2016   Vitamin D deficiency     Past Surgical History:  Procedure Laterality Date   CHOLECYSTECTOMY  2006    COLPOSCOPY  2008   ENDOMETRIAL BIOPSY  2017   negative per pt   GASTRIC BYPASS  2007   MASS EXCISION Left 08/17/2017   Procedure: EXCISION SOFT TISSUE  MASS LEFT FLANK;  Surgeon: Emily Gemma, MD;  Location: [facility];  Service: General;  Laterality: Left;    Family History  Problem Relation Age of Onset   Hypertension Mother    Obesity Mother    Hypertension Father    Sleep apnea Father    Obesity Father    Colon polyps Father    Alzheimer's disease Maternal Grandmother    Leukemia Maternal Grandfather    Alzheimer's disease Paternal Grandmother    Cancer Paternal Grandfather    Diabetes type II Maternal Aunt    Stroke Maternal Aunt    Multiple sclerosis Maternal Aunt    Heart attack Paternal Aunt     Social History   Socioeconomic History   Marital status: Single    Spouse name: Not on file   Number of children: Not on file   Years of education: Not on file   Highest education level: Not on file  Occupational History   Occupation: Therapist, sports    Employer: [employer]  Tobacco Use   Smoking status: Never    Passive exposure: Never  Smokeless tobacco: Never  Vaping Use   Vaping Use: Never used  Substance and Sexual Activity   Alcohol use: Yes    Comment: 2-3 glasses a week   Drug use: No   Sexual activity: Yes    Birth control/protection: Pill  Other Topics Concern   Not on file  Social History Narrative   Works as a Engineer, maintenance at [employer]   No children   Not married   Has a dog named [name]   From [place]- moved to [place] in 2002   Parents are both living   Has one sister back home   Enjoys going out to eat with friends   Enjoys biking and kayaking.    Goes to the GYM 6 days a week   Social Determinants of Radio broadcast assistant Strain: Not on file  Food Insecurity: Not on file  Transportation Needs: Not on file  Physical Activity: Not on file  Stress: Not on file  Social Connections: Not on file  Intimate Partner Violence:  Not on file    Outpatient Medications Prior to Visit  Medication Sig Dispense Refill   buPROPion (WELLBUTRIN XL) 150 MG 24 hr tablet TAKE 1 TABLET (150 MG TOTAL) BY MOUTH DAILY. 90 tablet 1   COVID-19 mRNA bivalent vaccine, Pfizer, injection Inject into the muscle. 0.3 mL 0   cyanocobalamin (,VITAMIN B-12,) 1000 MCG/ML injection Inject 1 mL (1,000 mcg total) into the muscle once a week. 12 mL 2   diltiazem (CARDIZEM CD) 180 MG 24 hr capsule Take 1 capsule (180 mg total) by mouth daily. 90 capsule 3   Ferrous Sulfate (IRON) 325 (65 Fe) MG TABS Take 1 tablet (325 mg total) by mouth every other day. 30 tablet 0   folic acid (FOLVITE) 1 MG tablet Take 1 mg by mouth daily.     meclizine (ANTIVERT) 25 MG tablet Take 1 tablet (25 mg total) by mouth 3 (three) times daily as needed for dizziness. 30 tablet 0   norethindrone-ethinyl estradiol-iron (MICROGESTIN FE 1.5/30) 1.5-30 MG-MCG tablet Take 1 tablet by mouth daily. 84 tablet 4   Vitamin D, Ergocalciferol, (DRISDOL) 1.25 MG (50000 UNIT) CAPS capsule Take 1 capsule (50,000 Units total) by mouth every 7 (seven) days. 12 capsule 0   tirzepatide (MOUNJARO) 10 MG/0.5ML Pen Inject 10 mg into the skin once a week. 6 mL 1   No facility-administered medications prior to visit.    Allergies  Allergen Reactions   Almond Oil Swelling   Apple Juice Swelling   Kiwi Extract Swelling   Soy Allergy Other (See Comments)   Strawberry Extract Other (See Comments)    ROS See HPI    Objective:    Physical Exam Constitutional:      General: She is not in acute distress.    Appearance: Normal appearance. She is not ill-appearing.  HENT:     Head: Normocephalic and atraumatic.     Right Ear: External ear normal.     Left Ear: External ear normal.  Eyes:     Extraocular Movements: Extraocular movements intact.     Pupils: Pupils are equal, round, and reactive to light.  Cardiovascular:     Rate and Rhythm: Normal rate and regular rhythm.     Heart  sounds: Normal heart sounds. No murmur heard.   No gallop.  Pulmonary:     Effort: Pulmonary effort is normal. No respiratory distress.     Breath sounds: Normal breath sounds.  No wheezing or rales.  Skin:    General: Skin is warm and dry.  Neurological:     Mental Status: She is alert and oriented to person, place, and time.  Psychiatric:        Judgment: Judgment normal.    BP (!) 104/36    Pulse 82    Temp 98.2 F (36.8 C)    Resp 18    Ht 5\' 4"  (1.626 m)    Wt 216 lb (98 kg)    SpO2 100%    BMI 37.08 kg/m  Wt Readings from Last 3 Encounters:  12/03/21 216 lb (98 kg)  11/25/21 219 lb (99.3 kg)  10/29/21 222 lb 3.2 oz (100.8 kg)       Assessment & Plan:   Problem List Items Addressed This Visit       Unprioritized   Tear of peroneal tendon of left foot - Primary    Scheduled for upcoming surgery. Management per Podiatry.       Morbid obesity ([place])    Wt Readings from Last 3 Encounters:  12/03/21 216 lb (98 kg)  11/25/21 219 lb (99.3 kg)  10/29/21 222 lb 3.2 oz (100.8 kg)  Max 238.  She has lost 25 pounds. She is currently using Mounjaro 10mg .  She went on a cruise this past month which she attributes to her lack of weight loss this month. Notes that the "food noise" in her head has picked back up.  Will increase dose to 12.5mg  weekly.        Relevant Medications   tirzepatide (MOUNJARO) 12.5 MG/0.5ML Pen     Meds ordered this encounter  Medications   tirzepatide (MOUNJARO) 12.5 MG/0.5ML Pen    Sig: Inject 12.5 mg into the skin once a week.    Dispense:  6 mL    Refill:  1    Order Specific Question:   Supervising Provider    Answer:   Penni Homans A [[id number]]    I, Nance Pear, NP, personally preformed the services described in this documentation.  All medical record entries made by the scribe were at my direction and in my presence.  I have reviewed the chart and discharge instructions (if applicable) and agree that the record reflects my personal  performance and is accurate and complete. 12/03/2021   I,Emily [name],acting as a scribe for Nance Pear, NP.,have documented all relevant documentation on the behalf of Nance Pear, NP,as directed by  Nance Pear, NP while in the presence of Nance Pear, NP.   Nance Pear, NP

## 2021-12-03 NOTE — Telephone Encounter (Signed)
PA form requested at [facility] as indicated by their PA department. [phone].  ?Will need to call again after 01-01-22.  ?

## 2021-12-03 NOTE — Assessment & Plan Note (Signed)
Wt Readings from Last 3 Encounters:  ?12/03/21 216 lb (98 kg)  ?11/25/21 219 lb (99.3 kg)  ?10/29/21 222 lb 3.2 oz (100.8 kg)  ? ?Max 238.  She has lost 25 pounds. She is currently using Mounjaro 10mg .  She went on a cruise this past month which she attributes to her lack of weight loss this month. Notes that the "food noise" in her head has picked back up.  Will increase dose to 12.5mg  weekly.   ?

## 2021-12-10 ENCOUNTER — Encounter: Payer: Self-pay | Admitting: Podiatry

## 2021-12-11 ENCOUNTER — Other Ambulatory Visit (HOSPITAL_BASED_OUTPATIENT_CLINIC_OR_DEPARTMENT_OTHER): Payer: Self-pay

## 2021-12-11 ENCOUNTER — Other Ambulatory Visit: Payer: Self-pay | Admitting: Podiatry

## 2021-12-11 MED ORDER — CEPHALEXIN 500 MG PO CAPS
500.0000 mg | ORAL_CAPSULE | Freq: Three times a day (TID) | ORAL | 0 refills | Status: DC
  Filled 2021-12-11: qty 21, 7d supply, fill #0

## 2021-12-11 MED ORDER — PROMETHAZINE HCL 25 MG PO TABS
25.0000 mg | ORAL_TABLET | Freq: Three times a day (TID) | ORAL | 0 refills | Status: DC | PRN
  Filled 2021-12-11: qty 20, 7d supply, fill #0

## 2021-12-11 MED ORDER — OXYCODONE-ACETAMINOPHEN 5-325 MG PO TABS
1.0000 | ORAL_TABLET | Freq: Four times a day (QID) | ORAL | 0 refills | Status: DC | PRN
  Filled 2021-12-11: qty 25, 3d supply, fill #0

## 2021-12-11 NOTE — Telephone Encounter (Signed)
Emily [name]- did you receive her paperwork? Thanks! ?

## 2021-12-12 ENCOUNTER — Encounter (HOSPITAL_BASED_OUTPATIENT_CLINIC_OR_DEPARTMENT_OTHER): Payer: Self-pay | Admitting: Podiatry

## 2021-12-12 ENCOUNTER — Other Ambulatory Visit: Payer: Self-pay

## 2021-12-12 NOTE — Progress Notes (Addendum)
Spoke w/ via phone for pre-op interview--- pt ?Lab needs dos----  urine preg             ?Lab results------ current ekg in epic/ chart ?COVID test -----patient states asymptomatic no test needed ?Arrive at ------- 1015  on 12-17-2021 ?NPO after MN NO Solid Food.  Clear liquids from MN until--- 0915 ?Med rec completed ?Medications to take morning of surgery ----- cardizem, wellbutrin, microgesta, folic acid ?Diabetic medication ----- n/a ?Patient instructed no nail polish to be worn day of surgery ?Patient instructed to bring photo id and insurance card day of surgery ?Patient aware to have Driver (ride ) / caregiver for 24 hours after surgery --  friend, tim nichols ?Patient Special Instructions ----- n/a ?Pre-Op special Istructions ----- received pt's pcp, [name], dated 12-03-2021 via fax from Dr Jacqualyn Posey office, place in chart. ?Sent inbox message to dr Jacqualyn Posey in epic, requested orders ?Patient verbalized understanding of instructions that were given at this phone interview. ?Patient denies shortness of breath, chest pain, fever, cough at this phone interview.  ?

## 2021-12-17 ENCOUNTER — Other Ambulatory Visit: Payer: Self-pay

## 2021-12-17 ENCOUNTER — Ambulatory Visit (HOSPITAL_BASED_OUTPATIENT_CLINIC_OR_DEPARTMENT_OTHER): Payer: No Typology Code available for payment source | Admitting: Certified Registered Nurse Anesthetist

## 2021-12-17 ENCOUNTER — Encounter (HOSPITAL_BASED_OUTPATIENT_CLINIC_OR_DEPARTMENT_OTHER)
Admission: RE | Disposition: A | Discharge status: Home / Self Care | Payer: Self-pay | Source: Home / Self Care | Attending: Podiatry

## 2021-12-17 ENCOUNTER — Encounter (HOSPITAL_BASED_OUTPATIENT_CLINIC_OR_DEPARTMENT_OTHER): Payer: Self-pay | Admitting: Podiatry

## 2021-12-17 ENCOUNTER — Ambulatory Visit (HOSPITAL_BASED_OUTPATIENT_CLINIC_OR_DEPARTMENT_OTHER)
Admission: RE | Admit: 2021-12-17 | Discharge: 2021-12-17 | Disposition: A | Discharge status: Home / Self Care | Payer: No Typology Code available for payment source | Attending: Podiatry | Admitting: Podiatry

## 2021-12-17 DIAGNOSIS — S96812A Strain of other specified muscles and tendons at ankle and foot level, left foot, initial encounter: Secondary | ICD-10-CM | POA: Diagnosis present

## 2021-12-17 DIAGNOSIS — S86302A Unspecified injury of muscle(s) and tendon(s) of peroneal muscle group at lower leg level, left leg, initial encounter: Secondary | ICD-10-CM | POA: Diagnosis not present

## 2021-12-17 DIAGNOSIS — D649 Anemia, unspecified: Secondary | ICD-10-CM | POA: Diagnosis not present

## 2021-12-17 DIAGNOSIS — D759 Disease of blood and blood-forming organs, unspecified: Secondary | ICD-10-CM | POA: Insufficient documentation

## 2021-12-17 DIAGNOSIS — E669 Obesity, unspecified: Secondary | ICD-10-CM | POA: Insufficient documentation

## 2021-12-17 DIAGNOSIS — Z6836 Body mass index (BMI) 36.0-36.9, adult: Secondary | ICD-10-CM | POA: Diagnosis not present

## 2021-12-17 DIAGNOSIS — S86312D Strain of muscle(s) and tendon(s) of peroneal muscle group at lower leg level, left leg, subsequent encounter: Secondary | ICD-10-CM | POA: Diagnosis not present

## 2021-12-17 DIAGNOSIS — X58XXXA Exposure to other specified factors, initial encounter: Secondary | ICD-10-CM | POA: Insufficient documentation

## 2021-12-17 HISTORY — DX: Presence of spectacles and contact lenses: Z97.3

## 2021-12-17 HISTORY — DX: Supraventricular tachycardia: I47.1

## 2021-12-17 HISTORY — DX: Iron deficiency anemia, unspecified: D50.9

## 2021-12-17 HISTORY — DX: Supraventricular tachycardia, unspecified: I47.10

## 2021-12-17 HISTORY — PX: FOOT SURGERY: SHX648

## 2021-12-17 LAB — POCT PREGNANCY, URINE: Preg Test, Ur: NEGATIVE

## 2021-12-17 SURGERY — TENDON REPAIR
Anesthesia: General | Laterality: Left

## 2021-12-17 MED ORDER — LIDOCAINE 2% (20 MG/ML) 5 ML SYRINGE
INTRAMUSCULAR | Status: DC | PRN
  Administered 2021-12-17: 80 mg via INTRAVENOUS

## 2021-12-17 MED ORDER — FENTANYL CITRATE (PF) 100 MCG/2ML IJ SOLN
50.0000 ug | Freq: Once | INTRAMUSCULAR | Status: AC
  Administered 2021-12-17: 50 ug via INTRAVENOUS

## 2021-12-17 MED ORDER — ROPIVACAINE HCL 5 MG/ML IJ SOLN
INTRAMUSCULAR | Status: DC | PRN
  Administered 2021-12-17: 30 mL via PERINEURAL

## 2021-12-17 MED ORDER — MIDAZOLAM HCL 2 MG/2ML IJ SOLN
INTRAMUSCULAR | Status: AC
  Filled 2021-12-17: qty 2

## 2021-12-17 MED ORDER — ACETAMINOPHEN 500 MG PO TABS
ORAL_TABLET | ORAL | Status: AC
  Filled 2021-12-17: qty 2

## 2021-12-17 MED ORDER — CELECOXIB 200 MG PO CAPS
ORAL_CAPSULE | ORAL | Status: AC
  Filled 2021-12-17: qty 1

## 2021-12-17 MED ORDER — PHENYLEPHRINE 40 MCG/ML (10ML) SYRINGE FOR IV PUSH (FOR BLOOD PRESSURE SUPPORT)
PREFILLED_SYRINGE | INTRAVENOUS | Status: DC | PRN
  Administered 2021-12-17 (×5): 80 ug via INTRAVENOUS

## 2021-12-17 MED ORDER — CHLORHEXIDINE GLUCONATE CLOTH 2 % EX PADS: 6.0000 | MEDICATED_PAD | Freq: Once | CUTANEOUS | Status: DC

## 2021-12-17 MED ORDER — HYDROMORPHONE HCL 1 MG/ML IJ SOLN: 0.2500 mg | INTRAMUSCULAR | Status: DC | PRN

## 2021-12-17 MED ORDER — PROPOFOL 10 MG/ML IV BOLUS
INTRAVENOUS | Status: AC
  Filled 2021-12-17: qty 20

## 2021-12-17 MED ORDER — CLONIDINE HCL (ANALGESIA) 100 MCG/ML EP SOLN
EPIDURAL | Status: DC | PRN
  Administered 2021-12-17: 80 ug

## 2021-12-17 MED ORDER — ACETAMINOPHEN 500 MG PO TABS
1000.0000 mg | ORAL_TABLET | Freq: Once | ORAL | Status: AC
  Administered 2021-12-17: 1000 mg via ORAL

## 2021-12-17 MED ORDER — FENTANYL CITRATE (PF) 100 MCG/2ML IJ SOLN
INTRAMUSCULAR | Status: AC
  Filled 2021-12-17: qty 2

## 2021-12-17 MED ORDER — ONDANSETRON HCL 4 MG/2ML IJ SOLN
INTRAMUSCULAR | Status: DC | PRN
  Administered 2021-12-17: 4 mg via INTRAVENOUS

## 2021-12-17 MED ORDER — MEPERIDINE HCL 25 MG/ML IJ SOLN: 6.2500 mg | INTRAMUSCULAR | Status: DC | PRN

## 2021-12-17 MED ORDER — CELECOXIB 200 MG PO CAPS
200.0000 mg | ORAL_CAPSULE | Freq: Once | ORAL | Status: AC
  Administered 2021-12-17: 200 mg via ORAL

## 2021-12-17 MED ORDER — FENTANYL CITRATE (PF) 100 MCG/2ML IJ SOLN
INTRAMUSCULAR | Status: DC | PRN
  Administered 2021-12-17: 100 ug via INTRAVENOUS

## 2021-12-17 MED ORDER — OXYCODONE HCL 5 MG PO TABS: 5.0000 mg | ORAL_TABLET | Freq: Once | ORAL | Status: DC | PRN

## 2021-12-17 MED ORDER — DEXAMETHASONE SODIUM PHOSPHATE 4 MG/ML IJ SOLN
INTRAMUSCULAR | Status: DC | PRN
  Administered 2021-12-17: 5 mg via PERINEURAL

## 2021-12-17 MED ORDER — PROPOFOL 10 MG/ML IV BOLUS
INTRAVENOUS | Status: DC | PRN
Start: 2021-12-17 — End: 2021-12-17
  Administered 2021-12-17: 200 mg via INTRAVENOUS

## 2021-12-17 MED ORDER — AMISULPRIDE (ANTIEMETIC) 5 MG/2ML IV SOLN: 10.0000 mg | Freq: Once | INTRAVENOUS | Status: DC | PRN

## 2021-12-17 MED ORDER — CEFAZOLIN SODIUM-DEXTROSE 2-4 GM/100ML-% IV SOLN
INTRAVENOUS | Status: AC
  Filled 2021-12-17: qty 100

## 2021-12-17 MED ORDER — SUGAMMADEX SODIUM 200 MG/2ML IV SOLN
INTRAVENOUS | Status: DC | PRN
  Administered 2021-12-17: 200 mg via INTRAVENOUS

## 2021-12-17 MED ORDER — OXYCODONE HCL 5 MG/5ML PO SOLN: 5.0000 mg | Freq: Once | ORAL | Status: DC | PRN

## 2021-12-17 MED ORDER — MIDAZOLAM HCL 5 MG/5ML IJ SOLN
INTRAMUSCULAR | Status: DC | PRN
  Administered 2021-12-17 (×2): 1 mg via INTRAVENOUS

## 2021-12-17 MED ORDER — 0.9 % SODIUM CHLORIDE (POUR BTL) OPTIME
TOPICAL | Status: DC | PRN
  Administered 2021-12-17: 500 mL

## 2021-12-17 MED ORDER — LACTATED RINGERS IV SOLN: INTRAVENOUS | Status: DC

## 2021-12-17 MED ORDER — MIDAZOLAM HCL 2 MG/2ML IJ SOLN
2.0000 mg | Freq: Once | INTRAMUSCULAR | Status: AC
  Administered 2021-12-17: 2 mg via INTRAVENOUS

## 2021-12-17 MED ORDER — ROCURONIUM BROMIDE 10 MG/ML (PF) SYRINGE
PREFILLED_SYRINGE | INTRAVENOUS | Status: DC | PRN
  Administered 2021-12-17: 10 mg via INTRAVENOUS
  Administered 2021-12-17: 60 mg via INTRAVENOUS

## 2021-12-17 MED ORDER — DEXAMETHASONE SODIUM PHOSPHATE 10 MG/ML IJ SOLN
INTRAMUSCULAR | Status: DC | PRN
  Administered 2021-12-17: 10 mg via INTRAVENOUS

## 2021-12-17 MED ORDER — CEFAZOLIN SODIUM-DEXTROSE 2-4 GM/100ML-% IV SOLN
2.0000 g | INTRAVENOUS | Status: AC
  Administered 2021-12-17: 2 g via INTRAVENOUS

## 2021-12-17 SURGICAL SUPPLY — 50 items
BAG SPEC THK2 15X12 ZIP CLS (MISCELLANEOUS) ×1
BAG ZIPLOCK 12X15 (MISCELLANEOUS) ×2 IMPLANT
BLADE SURG 15 STRL LF DISP TIS (BLADE) IMPLANT
BLADE SURG 15 STRL SS (BLADE) ×2
BLADE SURG SZ10 CARB STEEL (BLADE) ×2 IMPLANT
BNDG CMPR 9X4 STRL LF SNTH (GAUZE/BANDAGES/DRESSINGS) ×1
BNDG ELASTIC 4X5.8 VLCR STR LF (GAUZE/BANDAGES/DRESSINGS) ×2 IMPLANT
BNDG ELASTIC 6X5.8 VLCR STR LF (GAUZE/BANDAGES/DRESSINGS) ×1 IMPLANT
BNDG ESMARK 4X9 LF (GAUZE/BANDAGES/DRESSINGS) ×1 IMPLANT
BNDG GAUZE ELAST 4 BULKY (GAUZE/BANDAGES/DRESSINGS) ×1 IMPLANT
COVER SURGICAL LIGHT HANDLE (MISCELLANEOUS) ×2 IMPLANT
CUFF TOURN SGL QUICK 34 (TOURNIQUET CUFF) ×2
CUFF TRNQT CYL 34X4X40X1 (TOURNIQUET CUFF) IMPLANT
DRSG PAD ABDOMINAL 8X10 ST (GAUZE/BANDAGES/DRESSINGS) ×2 IMPLANT
DURAPREP 26ML APPLICATOR (WOUND CARE) ×2 IMPLANT
ELECT REM PT RETURN 9FT ADLT (ELECTROSURGICAL) ×2
ELECTRODE REM PT RTRN 9FT ADLT (ELECTROSURGICAL) ×1 IMPLANT
GAUZE 4X4 16PLY ~~LOC~~+RFID DBL (SPONGE) ×2 IMPLANT
GAUZE SPONGE 4X4 12PLY STRL (GAUZE/BANDAGES/DRESSINGS) ×2 IMPLANT
GAUZE XEROFORM 1X8 LF (GAUZE/BANDAGES/DRESSINGS) ×1 IMPLANT
GLOVE SURG ENC MOIS LTX SZ7.5 (GLOVE) ×4 IMPLANT
GOWN STRL REUS W/TWL XL LVL3 (GOWN DISPOSABLE) ×2 IMPLANT
IMP TENDON SHEET TENOGLIDE 2X2 ×1 IMPLANT
IMPL TENDON SHEET TENOGLID 2X2 IMPLANT
KIT TURNOVER CYSTO (KITS) IMPLANT
NS IRRIG 1000ML POUR BTL (IV SOLUTION) ×2 IMPLANT
PACK BASIN DAY SURGERY FS (CUSTOM PROCEDURE TRAY) ×2 IMPLANT
PACK ORTHO EXTREMITY (CUSTOM PROCEDURE TRAY) ×2 IMPLANT
PAD CAST 4YDX4 CTTN HI CHSV (CAST SUPPLIES) ×1 IMPLANT
PADDING CAST COTTON 4X4 STRL (CAST SUPPLIES) ×4
PADDING CAST COTTON 6X4 STRL (CAST SUPPLIES) ×2 IMPLANT
PASSER SUT SWANSON 36MM LOOP (INSTRUMENTS) ×2 IMPLANT
PENCIL SMOKE EVACUATOR (MISCELLANEOUS) ×1 IMPLANT
PROTECTOR NERVE ULNAR (MISCELLANEOUS) ×2 IMPLANT
SPLINT CAST 1 STEP 5X30 WHT (MISCELLANEOUS) ×1 IMPLANT
STAPLER VISISTAT (STAPLE) ×2 IMPLANT
STOCKINETTE 4X48 STRL (DRAPES) ×2 IMPLANT
SUT ETHILON 4 0 PS 2 18 (SUTURE) ×3 IMPLANT
SUT FIBERWIRE #2 38 T-5 BLUE (SUTURE) ×4
SUT MERSILENE 2.0 SH NDLE (SUTURE) ×1 IMPLANT
SUT MNCRL AB 3-0 PS2 27 (SUTURE) ×1 IMPLANT
SUT MNCRL AB 4-0 PS2 18 (SUTURE) ×1 IMPLANT
SUT VIC AB 0 CT2 27 (SUTURE) ×2 IMPLANT
SUT VIC AB 2-0 CT1 27 (SUTURE) ×4
SUT VIC AB 2-0 CT1 TAPERPNT 27 (SUTURE) ×2 IMPLANT
SUTURE FIBERWR #2 38 T-5 BLUE (SUTURE) ×2 IMPLANT
TAPE STRIPS DRAPE STRL (GAUZE/BANDAGES/DRESSINGS) ×2 IMPLANT
TENDON PROTECTOR SHEET ×1 IMPLANT
TOWEL OR 17X26 10 PK STRL BLUE (TOWEL DISPOSABLE) ×4 IMPLANT
WATER STERILE IRR 1000ML POUR (IV SOLUTION) ×2 IMPLANT

## 2021-12-17 NOTE — Brief Op Note (Signed)
12/17/2021 ? ?1:47 PM ? ?PATIENT:  Emily [name]  46 y.o. female ? ?PRE-OPERATIVE DIAGNOSIS:  TEAR OF PERONEAL TENDON OF LEFT FOOT ? ?POST-OPERATIVE DIAGNOSIS:  TEAR OF PERONEAL TENDON OF LEFT FOOT ? ?PROCEDURE:  Procedure(s) with comments: ?TENDON REPAIR OF LEFT FOOT (Left) - GENERAL WITH BLOCK ? ?SURGEON:  Surgeon(s) and Role: ?   Trula Slade, DPM - Primary ? ?PHYSICIAN ASSISTANT:  ? ?ASSISTANTS: none  ? ?ANESTHESIA:   general ? ?EBL:  minimal ? ?BLOOD ADMINISTERED:none ? ?DRAINS: none  ? ?LOCAL MEDICATIONS USED:  NONE ? ?SPECIMEN:  No Specimen ? ?DISPOSITION OF SPECIMEN:  PATHOLOGY ? ?COUNTS:  YES ? ?TOURNIQUET:   ?Total Tourniquet Time Documented: ?Thigh (Left) - 55 minutes ?Total: Thigh (Left) - 55 minutes ? ? ?DICTATION: .Dragon Dictation ? ?PLAN OF CARE: Discharge to home after PACU ? ?PATIENT DISPOSITION:  PACU - hemodynamically stable. ?  ?Delay start of Pharmacological VTE agent (>24hrs) due to surgical blood loss or risk of bleeding: no ? ?Intraoperative findings: ?Small partial tear of the peroneal brevis distally near insertion. Peroneal longus with tenosynovitis which was debrided. Repaired the peroneal brevis and applied Integra 2x2 tenoglide over the tendon. Tendon gliding in anatomic position.  ? ?Post-operative course: ?NWB LLE. Discharge home.  ? ?

## 2021-12-17 NOTE — Transfer of Care (Signed)
Immediate Anesthesia Transfer of Care Note ? ?Patient: Emily [name] ? ?Procedure(s) Performed: TENDON REPAIR OF LEFT FOOT (Left) ? ?Patient Location: PACU ? ?Anesthesia Type:General and Regional ? ?Level of Consciousness: awake, alert , oriented and patient cooperative ? ?Airway & Oxygen Therapy: Patient Spontanous Breathing and Patient connected to face mask oxygen ? ?Post-op Assessment: Report given to RN and Post -op Vital signs reviewed and stable ? ?Post vital signs: Reviewed and stable ? ?Last Vitals:  ?Vitals Value Taken Time  ?BP 129/82 12/17/21 1349  ?Temp    ?Pulse 99 12/17/21 1356  ?Resp 9 12/17/21 1356  ?SpO2 100 % 12/17/21 1356  ?Vitals shown include unvalidated device data. ? ?Last Pain:  ?Vitals:  ? 12/17/21 1041  ?TempSrc: Oral  ?PainSc: 0-No pain  ?   ? ?Patients Stated Pain Goal: 5 (12/17/21 1041) ? ?Complications: No notable events documented. ?

## 2021-12-17 NOTE — Anesthesia Procedure Notes (Signed)
Procedure Name: Intubation ?Date/Time: 12/17/2021 12:18 PM ?Performed by: Rogers Blocker, CRNA ?Pre-anesthesia Checklist: Patient identified, Emergency Drugs available, Suction available and Patient being monitored ?Patient Re-evaluated:Patient Re-evaluated prior to induction ?Oxygen Delivery Method: Circle System Utilized ?Preoxygenation: Pre-oxygenation with 100% oxygen ?Induction Type: IV induction ?Ventilation: Mask ventilation without difficulty ?Laryngoscope Size: Mac and 3 ?Grade View: Grade I ?Tube type: Oral ?Tube size: 7.0 mm ?Number of attempts: 1 ?Airway Equipment and Method: Stylet and Bite block ?Placement Confirmation: ETT inserted through vocal cords under direct vision, positive ETCO2 and breath sounds checked- equal and bilateral ?Secured at: 22 cm ?Tube secured with: Tape ?Dental Injury: Teeth and Oropharynx as per pre-operative assessment  ? ? ? ? ?

## 2021-12-17 NOTE — Anesthesia Procedure Notes (Addendum)
Anesthesia Regional Block: Popliteal block  ? ?Pre-Anesthetic Checklist: , timeout performed,  Correct Patient, Correct Site, Correct Laterality,  Correct Procedure, Correct Position, site marked,  Risks and benefits discussed,  Surgical consent,  Pre-op evaluation,  At surgeon's request and post-op pain management ? ?Laterality: Lower and Left ? ?Prep: chloraprep     ?  ?Needles:  ?Injection technique: Single-shot ? ?Needle Type: Stimiplex   ? ? ?Needle Length: 10cm  ?Needle Gauge: 21  ? ? ? ?Additional Needles: ? ? ?Procedures:,,,, ultrasound used (permanent image in chart),,   ?Motor weakness within 5 minutes.  ?Narrative:  ?Start time: 12/17/2021 11:22 AM ?End time: 12/17/2021 11:44 AM ?Injection made incrementally with aspirations every 5 mL. ? ?Performed by: Personally  ?Anesthesiologist: Nolon Nations, MD ? ?Additional Notes: ?Nerve located and needle positioned with direct ultrasound guidance. Good perineural spread. Patient tolerated well. ? ? ? ? ?

## 2021-12-17 NOTE — Anesthesia Postprocedure Evaluation (Signed)
Anesthesia Post Note ? ?Patient: Emily [name] ? ?Procedure(s) Performed: TENDON REPAIR OF LEFT FOOT (Left) ? ?  ? ?Patient location during evaluation: PACU ?Anesthesia Type: General and Regional ?Level of consciousness: sedated and patient cooperative ?Pain management: pain level controlled ?Vital Signs Assessment: post-procedure vital signs reviewed and stable ?Respiratory status: spontaneous breathing ?Cardiovascular status: stable ?Anesthetic complications: no ? ? ?No notable events documented. ? ?Last Vitals:  ?Vitals:  ? 12/17/21 1430 12/17/21 1505  ?BP: 116/70 121/71  ?Pulse: 92 89  ?Resp: (!) 21 15  ?Temp:  36.6 ?C  ?SpO2: 98% 100%  ?  ?Last Pain:  ?Vitals:  ? 12/17/21 1505  ?TempSrc:   ?PainSc: 0-No pain  ? ? ?  ?  ?  ?  ?  ?  ? ?Nolon Nations ? ? ? ? ?

## 2021-12-17 NOTE — H&P (Signed)
Patient seen in pre-op. Had block by anesthesia. We went over the procedure and marked the area where she had her pain along the course of the peroneal tendon. Again discussed the surgery and she has no further questions. Consent signed. Will proceed as scheduled.  ?

## 2021-12-17 NOTE — Anesthesia Preprocedure Evaluation (Addendum)
Anesthesia Evaluation  ?Patient identified by MRN, date of birth, ID band ?Patient awake ? ? ? ?Reviewed: ?Allergy & Precautions, NPO status , Patient's Chart, lab work & pertinent test results ? ?Airway ?Mallampati: II ? ?TM Distance: >3 FB ?Neck ROM: Full ? ? ? Dental ?no notable dental hx. ?(+) Dental Advisory Given, Teeth Intact ?  ?Pulmonary ?neg pulmonary ROS,  ?  ?Pulmonary exam normal ?breath sounds clear to auscultation ? ? ? ? ? ? Cardiovascular ?negative cardio ROS ?Normal cardiovascular exam ?Rhythm:Regular Rate:Normal ? ? ?  ?Neuro/Psych ?PSYCHIATRIC DISORDERS Depression negative neurological ROS ?   ? GI/Hepatic ?negative GI ROS, Neg liver ROS,   ?Endo/Other  ?negative endocrine ROS ? Renal/GU ?negative Renal ROS  ? ?  ?Musculoskeletal ? ?(+) Arthritis ,  ? Abdominal ?(+) + obese,   ?Peds ? Hematology ? ?(+) Blood dyscrasia, anemia ,   ?Anesthesia Other Findings ? ? Reproductive/Obstetrics ?negative OB ROS ? ?  ? ? ? ? ? ? ? ? ? ? ? ? ? ?  ?  ? ? ? ? ? ? ? ?Anesthesia Physical ?Anesthesia Plan ? ?ASA: 2 ? ?Anesthesia Plan: General and Regional  ? ?Post-op Pain Management: Tylenol PO (pre-op)*, Celebrex PO (pre-op)* and Regional block*  ? ?Induction: Intravenous ? ?PONV Risk Score and Plan: 3 and Ondansetron, Dexamethasone, Treatment may vary due to age or medical condition and Midazolam ? ?Airway Management Planned: Oral ETT ? ?Additional Equipment:  ? ?Intra-op Plan:  ? ?Post-operative Plan: Extubation in OR ? ?Informed Consent: I have reviewed the patients History and Physical, chart, labs and discussed the procedure including the risks, benefits and alternatives for the proposed anesthesia with the patient or authorized representative who has indicated his/her understanding and acceptance.  ? ? ? ?Dental advisory given ? ?Plan Discussed with: CRNA ? ?Anesthesia Plan Comments:   ? ? ? ? ?Anesthesia Quick Evaluation ? ?

## 2021-12-17 NOTE — Progress Notes (Signed)
Assisted Dr. Germeroth with left, ultrasound guided, popliteal block. Side rails up, monitors on throughout procedure. See vital signs in flow sheet. Tolerated Procedure well. 

## 2021-12-17 NOTE — Discharge Instructions (Addendum)
See written instructions  ? ?Do not put weight on surgical foot.  ?Ice/elevate ?Resume all home medications as previously prescribed.  ?Start pain medication as needed. Start antibiotics. Use the nausea medication as needed ? ? ?No acetaminophen/Tylenol until after 5:00pm today if needed for pain. ? ?No ibuprofen, Advil, Aleve, Motrin, ketorolac, meloxicam, naproxen, or other NSAIDS until after 5:00pm today if needed for pain.  ? ? ? ? ?Post Anesthesia Home Care Instructions ? ?Activity: ?Get plenty of rest for the remainder of the day. A responsible individual must stay with you for 24 hours following the procedure.  ?For the next 24 hours, DO NOT: ?-Drive a car ?-Paediatric nurse ?-Drink alcoholic beverages ?-Take any medication unless instructed by your physician ?-Make any legal decisions or sign important papers. ? ?Meals: ?Start with liquid foods such as gelatin or soup. Progress to regular foods as tolerated. Avoid greasy, spicy, heavy foods. If nausea and/or vomiting occur, drink only clear liquids until the nausea and/or vomiting subsides. Call your physician if vomiting continues. ? ?Special Instructions/Symptoms: ?Your throat may feel dry or sore from the anesthesia or the breathing tube placed in your throat during surgery. If this causes discomfort, gargle with warm salt water. The discomfort should disappear within 24 hours. ? ? ? ? ?Regional Anesthesia Blocks ? ?1. Numbness or the inability to move the "blocked" extremity may last from 3-48 hours after placement. The length of time depends on the medication injected and your individual response to the medication. If the numbness is not going away after 48 hours, call your surgeon. ? ?2. The extremity that is blocked will need to be protected until the numbness is gone and the  Strength has returned. Because you cannot feel it, you will need to take extra care to avoid injury. Because it may be weak, you may have difficulty moving it or using it. You  may not know what position it is in without looking at it while the block is in effect. ? ?3. For blocks in the legs and feet, returning to weight bearing and walking needs to be done carefully. You will need to wait until the numbness is entirely gone and the strength has returned. You should be able to move your leg and foot normally before you try and bear weight or walk. You will need someone to be with you when you first try to ensure you do not fall and possibly risk injury. ? ?4. Bruising and tenderness at the needle site are common side effects and will resolve in a few days. ? ?5. Persistent numbness or new problems with movement should be communicated to the surgeon or the [facility] [phone] [place] [phone]).  ? ? ?Patient called and given block instructions 12/17/2021. Patient was given written AVS, but block instructions were not printed.  ?

## 2021-12-18 ENCOUNTER — Encounter (HOSPITAL_BASED_OUTPATIENT_CLINIC_OR_DEPARTMENT_OTHER): Payer: Self-pay | Admitting: Podiatry

## 2021-12-22 ENCOUNTER — Other Ambulatory Visit (HOSPITAL_COMMUNITY): Payer: Self-pay

## 2021-12-22 ENCOUNTER — Other Ambulatory Visit: Payer: Self-pay

## 2021-12-22 ENCOUNTER — Ambulatory Visit (INDEPENDENT_AMBULATORY_CARE_PROVIDER_SITE_OTHER): Payer: No Typology Code available for payment source | Admitting: Podiatry

## 2021-12-22 VITALS — BP 121/59 | Temp 97.9°F | Resp 17

## 2021-12-22 DIAGNOSIS — S86312D Strain of muscle(s) and tendon(s) of peroneal muscle group at lower leg level, left leg, subsequent encounter: Secondary | ICD-10-CM

## 2021-12-22 DIAGNOSIS — Z9889 Other specified postprocedural states: Secondary | ICD-10-CM

## 2021-12-22 MED ORDER — IBUPROFEN 800 MG PO TABS
800.0000 mg | ORAL_TABLET | Freq: Three times a day (TID) | ORAL | 0 refills | Status: DC | PRN
  Filled 2021-12-22: qty 30, 10d supply, fill #0

## 2021-12-22 MED ORDER — OXYCODONE-ACETAMINOPHEN 5-325 MG PO TABS
1.0000 | ORAL_TABLET | Freq: Four times a day (QID) | ORAL | 0 refills | Status: DC | PRN
  Filled 2021-12-22: qty 20, 3d supply, fill #0

## 2021-12-22 NOTE — Progress Notes (Signed)
Subjective: ?Emily [name] is a 46 y.o. is seen today in office s/p left peroneal tendon repair preformed on 12/17/2021.  States that she is doing well.  She still been discomfort and she is finished all of the progress that.  She has been nonweightbearing.  No recent injury or falls.  Denies any fevers or chills.  No chest pain or shortness of breath.  No other concerns. ? ? ?Objective: ?General: No acute distress, AAOx3  ?DP/PT pulses palpable 2/4, CRT < 3 sec to all digits.  ?Protective sensation intact. Motor function intact.  ?left foot: Incision is well coapted without any evidence of dehiscence with sutures, staples intact.  There is mild swelling.  There is no surrounding erythema.  Mild ecchymosis present.  There is no drainage or pus or signs of infection.  Mild tenderness palpation on the surgical site itself. ?No pain with calf compression, swelling, warmth, erythema.  ? ?Assessment and Plan:  ?Status post left peroneal tendon repair, doing well with no complications  ? ?-Treatment options discussed including all alternatives, risks, and complications ?-Incisions healing well at this time postoperatively.  Incision was cleaned with wound cleanser.  Small amount of Betadine ointment applied followed by dressing.  I then reapplied a compression wrap as well as a posterior splint. ?-Continue nonweightbearing ?-Ice/elevation ?-Pain medication as needed- refilled today ?-Monitor for any clinical signs or symptoms of infection and DVT/PE and directed to call the office immediately should any occur or go to the ER. ?-Follow-up as scheduled or sooner if any problems arise. In the meantime, encouraged to call the office with any questions, concerns, change in symptoms.  ? ?Celesta Gentile, DPM ? ?

## 2021-12-23 ENCOUNTER — Encounter: Payer: Self-pay | Admitting: Podiatry

## 2021-12-23 ENCOUNTER — Other Ambulatory Visit (HOSPITAL_BASED_OUTPATIENT_CLINIC_OR_DEPARTMENT_OTHER): Payer: Self-pay

## 2021-12-24 NOTE — Op Note (Signed)
PATIENT:  Emily [name]  46 y.o. female ?  ?PRE-OPERATIVE DIAGNOSIS:  TEAR OF PERONEAL TENDON OF LEFT FOOT ?  ?POST-OPERATIVE DIAGNOSIS:  TEAR OF PERONEAL TENDON OF LEFT FOOT ?  ?PROCEDURE:  Procedure(s) with comments: ?TENDON REPAIR OF LEFT FOOT (Left) - GENERAL WITH BLOCK ?  ?SURGEON:  Surgeon(s) and Role: ?   Trula Slade, DPM - Primary ?  ?PHYSICIAN ASSISTANT:  ?  ?ASSISTANTS: none  ?  ?ANESTHESIA:   general ?  ?EBL:  minimal ?  ?BLOOD ADMINISTERED:none ?  ?DRAINS: none  ?  ?LOCAL MEDICATIONS USED:  NONE ?  ?SPECIMEN:  No Specimen ?  ?DISPOSITION OF SPECIMEN:  PATHOLOGY ?  ?COUNTS:  YES ?  ?TOURNIQUET:   ?Total Tourniquet Time Documented: ?Thigh (Left) - 55 minutes ?Total: Thigh (Left) - 55 minutes ?  ?  ?DICTATION: .Dragon Dictation ?  ?PLAN OF CARE: Discharge to home after PACU ?  ?PATIENT DISPOSITION:  PACU - hemodynamically stable. ?  ?Delay start of Pharmacological VTE agent (>24hrs) due to surgical blood loss or risk of bleeding: no ?  ?Intraoperative findings: ?Small partial tear of the peroneal brevis distally near insertion. Peroneal longus with tenosynovitis which was debrided. Repaired the peroneal brevis and applied Integra 2x2 tenoglide over the tendon. Tendon gliding in anatomic position.  ?  ?Indications for surgery: ?46 year old female presents to the office for concerns of pain to her left foot just proximal to the fifth metatarsal base.  She is under the care of Dr. Milinda Pointer.  MRI was performed which revealed a split tear and despite immobilization and conservative care she continues to have pain and she was originally scheduled with Dr. Milinda Pointer for surgery however she went to switch to the hospital so she was referred to me for surgical care.  Alternatives, risks, complications were discussed.  No promises or guarantees were given of the outcome of the procedure and all questions were answered to the best my ability. ? ?The patient was both verbally and visually identified by myself, the  nursing staff, the anesthesia staff preoperatively.  Preoperatively she also had a nerve block performed by the anesthesia staff.  She was then transferred to the operating room via stretcher and placed on the operating table in a prone position.  She had a pad all prominences.  A well-padded pneumatic thigh tourniquet was applied making sure to pad all bony prominences.  The left lower extremities and scrubbed, prepped, draped in normal sterile fashion. ? ?Timeout was performed.  Esmarch bandage was utilized to exsanguinate the left lower extremity and the pneumatic thigh tourniquet was inflated to 350 mmHg.  An incision was planned along the distal portion of the peroneal tendon just inferior to the lateral malleolus towards the fifth metatarsal base.  The incision was made with a #15 blade scalpel the epidermis and the dermis.  The subcutaneous tissues were bluntly and sharply dissected began to retract all vital neurovascular structures and all bleeders were cauterized as necessary.  Dissection was then carried down to identify the tendon sheath which was incised to identify both tendons.  Attention was first directed on the peroneus brevis and was satisfied to be small partial.  Presently distal portion of the tendon and fraying of the tendon.  I sharply debrided the tendon with a 15 blade scalpel down to healthy tendon.  There was some inflammatory tissue noted around the tendon as well which I debrided.  At this time I utilized 2-0 Mersilene suture repair of the tendon.  The tendon at this time was found be more normal caliber.  Tendon was intact.  Attention was directed to the peroneus longus and there was some inflammatory tissue around this as well which I debrided.  At this time the incision was copiously irrigated with saline and hemostasis achieved.  The tendons were placed into anatomical position and there found to be gliding smoothly.  I then utilized a 2 x 2 Tenoglide over the peroneus brevis which  was sutured in place.  Deep structures were then repaired with 2-0 Monocryl followed by the subcutaneous tissues with 3-0 Monocryl and 4-0 Monocryl and the skin was then closed with nylon as well as skin staples.  Xeroform was applied to the incision followed by a dry sterile dressing.  The tourniquet was released and there was found to be an immediate cap refill time to all the digits.  She was awoken anesthesia and found to tolerate the procedure without any complications.  Transferred to PACU with vital signs stable and vascular status intact. ?

## 2021-12-26 ENCOUNTER — Other Ambulatory Visit (HOSPITAL_BASED_OUTPATIENT_CLINIC_OR_DEPARTMENT_OTHER): Payer: Self-pay

## 2022-01-01 ENCOUNTER — Encounter: Payer: Self-pay | Admitting: Student

## 2022-01-01 ENCOUNTER — Ambulatory Visit (INDEPENDENT_AMBULATORY_CARE_PROVIDER_SITE_OTHER): Payer: No Typology Code available for payment source | Admitting: Podiatry

## 2022-01-01 DIAGNOSIS — S86312D Strain of muscle(s) and tendon(s) of peroneal muscle group at lower leg level, left leg, subsequent encounter: Secondary | ICD-10-CM

## 2022-01-01 DIAGNOSIS — Z9889 Other specified postprocedural states: Secondary | ICD-10-CM

## 2022-01-05 ENCOUNTER — Encounter: Payer: Self-pay | Admitting: Podiatry

## 2022-01-05 NOTE — Progress Notes (Signed)
Subjective: ?Emily [name] is a 46 y.o. is seen today in office s/p left peroneal tendon repair preformed on 12/17/2021.  Presents today for possible suture removal.  States that she has been doing better this week with little discomfort and pain level 2/10.  She is been trying ice and elevate.  Denies any fevers or chills.  She has no other concerns today.   ? ?Objective: ?General: No acute distress, AAOx3  ?DP/PT pulses palpable 2/4, CRT < 3 sec to all digits.  ?Protective sensation intact. Motor function intact.  ?left foot: Incision is well coapted without any evidence of dehiscence with sutures, staples intact.  There is decreased edema.  There is no surrounding erythema, ascending cellulitis.  No fluctuance or crepitation.  No signs of infection.  Mild tenderness palpation along surgical site. ?No pain with calf compression, swelling, warmth, erythema.  ? ?Assessment and Plan:  ?Status post left peroneal tendon repair, doing well with no complications  ? ?-Treatment options discussed including all alternatives, risks, and complications ?-Sutures removed the staples left intact.  No evidence of dehiscence or signs of infection at this time.  I cleaned the incision.  Antibiotic ointment and a bandage was applied. ?-Continue nonweightbearing.  Cam boot dispensed for immobilization and she is to wear this at all times even while sleeping.  Discussed placing to her cast but would not treat the boot like a cast and she has not to remove it or put weight on the foot. ?-Monitor for any clinical signs or symptoms of infection and directed to call the office immediately should any occur or go to the ER. ? ?Return in about 10 days (around 01/11/2022) for staple removal . ? ?Trula Slade DPM ?

## 2022-01-05 NOTE — Telephone Encounter (Signed)
Can you help assist with the letter? Thanks! ?

## 2022-01-06 ENCOUNTER — Encounter: Payer: Self-pay | Admitting: Podiatry

## 2022-01-08 ENCOUNTER — Encounter: Payer: Self-pay | Admitting: Podiatry

## 2022-01-15 ENCOUNTER — Ambulatory Visit (INDEPENDENT_AMBULATORY_CARE_PROVIDER_SITE_OTHER): Payer: No Typology Code available for payment source | Admitting: Podiatry

## 2022-01-15 ENCOUNTER — Encounter: Payer: Self-pay | Admitting: Podiatry

## 2022-01-15 DIAGNOSIS — S86312D Strain of muscle(s) and tendon(s) of peroneal muscle group at lower leg level, left leg, subsequent encounter: Secondary | ICD-10-CM

## 2022-01-18 NOTE — Progress Notes (Signed)
Subjective: ?Emily [name] is a 46 y.o. is seen today in office s/p left peroneal tendon repair preformed on 12/17/2021.  Presents today for possible staple removal.  She has been doing well and pain is improving.  There is no new concerns.  Denies any fevers or chills.  ? ?Objective: ?General: No acute distress, AAOx3  ?DP/PT pulses palpable 2/4, CRT < 3 sec to all digits.  ?Protective sensation intact. Motor function intact.  ?left foot: Incision is well coapted without any evidence of dehiscence with staples intact.  There is minimal edema.  No erythema or warmth.  No signs of dehiscence noted today.  Mild tenderness palpation at surgical site but no other areas of discomfort. ?No pain with calf compression, swelling, warmth, erythema.  ? ?Assessment and Plan:  ?Status post left peroneal tendon repair, doing well with no complications  ? ?-Treatment options discussed including all alternatives, risks, and complications ?-I remove the staples today without any complications incisions well coapted.  Antibiotic ointment and a bandage applied.  Discussed that she can wash the foot with soap and water dry thoroughly and apply a similar bandage.  She is to remain nonweightbearing still and wear the cam boot at all times.  Continue ice, elevate. ?-Monitor for any clinical signs or symptoms of infection and directed to call the office immediately should any occur or go to the ER. ? ?Return in about 2 weeks (around 01/29/2022). ? ?Trula Slade DPM ?

## 2022-01-19 ENCOUNTER — Other Ambulatory Visit (HOSPITAL_BASED_OUTPATIENT_CLINIC_OR_DEPARTMENT_OTHER): Payer: Self-pay

## 2022-01-21 ENCOUNTER — Telehealth: Payer: Self-pay

## 2022-01-21 NOTE — Telephone Encounter (Signed)
Rx for mounjaro was not covered by insurance as of 1st of April.  Call for pa initiated 01-04-22 with attached physician statement.  ?Appeal for faxed 01/15/2022 after medication still denied. This was sent with the statement and medical records.  ?Final denial  received 01/16/22 due to guidelines requiring patient to have diagnosis of type 2 diabetes.  ? ?

## 2022-01-23 ENCOUNTER — Telehealth: Payer: Self-pay | Admitting: Family

## 2022-01-23 ENCOUNTER — Other Ambulatory Visit (HOSPITAL_BASED_OUTPATIENT_CLINIC_OR_DEPARTMENT_OTHER): Payer: Self-pay

## 2022-01-23 MED ORDER — GABAPENTIN 100 MG PO CAPS
100.0000 mg | ORAL_CAPSULE | Freq: Three times a day (TID) | ORAL | 0 refills | Status: DC
  Filled 2022-01-23: qty 90, 30d supply, fill #0

## 2022-01-23 NOTE — Telephone Encounter (Signed)
Pt c/o nerve pain in surgical foot. Rx sent for gabapentin.  ?

## 2022-01-27 ENCOUNTER — Ambulatory Visit (INDEPENDENT_AMBULATORY_CARE_PROVIDER_SITE_OTHER): Payer: No Typology Code available for payment source | Admitting: Podiatry

## 2022-01-27 DIAGNOSIS — S86312D Strain of muscle(s) and tendon(s) of peroneal muscle group at lower leg level, left leg, subsequent encounter: Secondary | ICD-10-CM

## 2022-01-27 DIAGNOSIS — Z9889 Other specified postprocedural states: Secondary | ICD-10-CM

## 2022-01-29 ENCOUNTER — Encounter: Payer: Self-pay | Admitting: Podiatry

## 2022-01-29 DIAGNOSIS — S86312D Strain of muscle(s) and tendon(s) of peroneal muscle group at lower leg level, left leg, subsequent encounter: Secondary | ICD-10-CM

## 2022-01-30 ENCOUNTER — Ambulatory Visit (INDEPENDENT_AMBULATORY_CARE_PROVIDER_SITE_OTHER): Payer: No Typology Code available for payment source | Admitting: Physical Therapy

## 2022-01-30 ENCOUNTER — Encounter: Payer: Self-pay | Admitting: Physical Therapy

## 2022-01-30 DIAGNOSIS — R2689 Other abnormalities of gait and mobility: Secondary | ICD-10-CM | POA: Diagnosis not present

## 2022-01-30 DIAGNOSIS — M25572 Pain in left ankle and joints of left foot: Secondary | ICD-10-CM

## 2022-01-30 NOTE — Therapy (Signed)
?OUTPATIENT PHYSICAL THERAPY LOWER EXTREMITY EVALUATION ? ? ?Patient Name: Emily [name] ?MRN: [id number] ?DOB:May 19, 1976, 46 y.o., female ?Today's Date: 01/30/2022 ? ? PT End of Session - 01/30/22 1306   ? ? Visit Number 1   ? Number of Visits 12   ? Date for PT Re-Evaluation 03/13/22   ? Authorization Type [facility]   ? PT Start Time 1015   ? PT Stop Time 1055   ? PT Time Calculation (min) 40 min   ? Activity Tolerance Patient tolerated treatment well   ? Behavior During Therapy [facility] for tasks assessed/performed   ? ?  ?  ? ?  ? ? ?Past Medical History:  ?Diagnosis Date  ? B12 deficiency   ? Depression   ? History of COVID-19 2020  ? per pt mild symptoms that resolved  ? Hypermetropia of both eyes 09/12/2014  ? IDA (iron deficiency anemia)   ? Impingement syndrome of right shoulder 05/21/2015  ? Lactose intolerance   ? per pt due to gastric bypass  ? Morbid obesity ([place]) 08/15/2017  ? Nevus of choroid of left eye 09/12/2014  ? being monitored  ? Palpitations   ? due ot SVT  ? Psoriasis   ? Seborrheic dermatitis 02/08/2015  ? Status post gastric bypass for obesity 12/08/2016  ? SVT (supraventricular tachycardia) ([place])   ? cardiologist--- dr Edyth Gunnels;    normal echo in epic 02-17-2021 and event monitor 02-04-2021  symptomatic SVT w/ 5 brief episodes the longest 14 beats  ? Vitamin D deficiency   ? Wears glasses   ? ?Past Surgical History:  ?Procedure Laterality Date  ? COLPOSCOPY  2008  ? ENDOMETRIAL BIOPSY  2017  ? negative per pt  ? LAPAROSCOPIC CHOLECYSTECTOMY  2006  ? MASS EXCISION Left 08/17/2017  ? Procedure: EXCISION SOFT TISSUE  MASS LEFT FLANK;  Surgeon: [name], MD;  Location: [place];  Service: General;  Laterality: Left;  ? ROUX-EN-Y GASTRIC BYPASS  2007  ? TENDON REPAIR Left 12/17/2021  ? Procedure: TENDON REPAIR OF LEFT FOOT;  Surgeon: Trula Slade, DPM;  Location: [place];  Service: Podiatry;  Laterality: Left;  GENERAL WITH BLOCK  ? ?Patient Active  Problem List  ? Diagnosis Date Noted  ? Tear of peroneal tendon of left foot 12/03/2021  ? Benign paroxysmal positional vertigo of right ear 07/15/2021  ? Preventative health care 06/18/2021  ? Bronchitis 05/06/2021  ? Left foot pain 04/09/2021  ? Weight gain 04/09/2021  ? Supraventricular tachycardia ([place]) 03/12/2021  ? Dyspnea on exertion 01/10/2021  ? Lactose intolerance   ? Joint pain   ? Gallbladder problem   ? Palpitations   ? Back pain   ? Anemia   ? Allergy   ? Soft tissue mass 08/16/2017  ? Morbid obesity ([place]) 08/15/2017  ? Obesity (BMI 35.0-39.9 without comorbidity) 12/23/2016  ? Status post gastric bypass for obesity 12/08/2016  ? Vitamin B 12 deficiency 12/08/2016  ? Depression 12/08/2016  ? B12 deficiency 10/30/2016  ? Multiple food allergies 10/30/2016  ? Vitamin D deficiency 10/19/2016  ? AC (acromioclavicular) arthritis 05/21/2015  ? Acute pain of right shoulder 05/21/2015  ? Biceps tendonitis on right 05/21/2015  ? Impingement syndrome of right shoulder 05/21/2015  ? Seborrheic dermatitis 02/08/2015  ? Hypermetropia of both eyes 09/12/2014  ? Nevus of choroid of left eye 09/12/2014  ? Psoriasis 01/15/2014  ? Compound nevus 01/15/2014  ? ? ?PCP: [name] ? ?REFERRING PROVIDER: Celesta Gentile ? ?  REFERRING DIAG: S/P L peroneal tendon repair 12/17/21 ? ?THERAPY DIAG:  ?Pain in left ankle and joints of left foot ? ?Other abnormalities of gait and mobility ? ?ONSET DATE: 12/17/21   ? ?SUBJECTIVE:  ? ?SUBJECTIVE STATEMENT: ?Pt had L peroneal tendon repair on 12/17/21. She has been in boot and NWB since surgery. She had f/u appt 4/25. States PWB at this time, will clarify other movement restrictions.  ?Reports increased pain in L ankle Last may, no injury , but was exercising. Has been in boot since Nov due to pain prior to surgery.  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  ?Notes nerve pain on top of foot , just  started gab pentin on Saturday for this.  ? ? ?PERTINENT HISTORY: ?Iron deficiency/anemia, SVT, ? ?PAIN:  ?Are you having pain? Yes: NPRS scale: more nerve pain 5/10 ?Pain location: L foot/ankle ?Pain description: burning ?Aggravating factors: at end of day ?Relieving factors: elevation ? ?PRECAUTIONS: None ? ?WEIGHT BEARING RESTRICTIONS Yes PWB  ? ?FALLS:  ?Has patient fallen in last 6 months? No ? ? ?PLOF: Independent ? ?PATIENT GOALS return to PLOF, no pain in foot.  ? ? ?OBJECTIVE:  ? ? ?COGNITION: ? Overall cognitive status: Within functional limits for tasks assessed   ?  ?SENSATION: ?Mild hypersensitivity on dorsum of foot with light touch, states worse when foot swollen at end of day.  ? ?PALPATION: ?Healing incision at lateral L ankle.  ? ?LE ROM: ?  01/30/22: Hips: WFL, Knee: WFL, Ankle: not tested, until we find out ROM/activity limitations for PT. Is able to move toes well.  ? ?LE MMT: ?  01/30/22:  Hips: 4/5, Knees: 4+/5 , Ankle: not tested.  ? ? ?GAIT: ?Distance walked: 50 ft ?Assistive device utilized: Crutches ?Level of assistance: SBA ?Comments:  ?Education on crutch use, step length, and partial weight bearing, Adjusted crutch height. Education on stairs for sequencing and hand rail/ Crutch use.  ? ? ?TODAY'S TREATMENT: ?See above for gait training, AROM L toes x 10;  LAQ x 10 on L, SLR x 10 on L, education on HEP, Education on alternating foot positions on floor and being elevated. Discussed boot wearing and PWB status.  ? ? ?PATIENT EDUCATION:  ?Education details: PT POC, Exam findings, HEP, Precautions.  ?Person educated: Patient ?Education method: Explanation, Demonstration, Tactile cues, Verbal cues, and Handouts ?Education comprehension: verbalized understanding, returned demonstration, verbal cues required, tactile cues required, and needs further education ? ? ?HOME EXERCISE PROGRAM: ?Access Code: [email or web address] ?URL: [email or web address] ?Date: 01/30/2022 ?Prepared by: Lyndee Hensen ? ?Exercises ?- Straight Leg Raise  - 1 x daily - 2 sets - 10 reps ?- Seated Knee Extension AROM  - 1 x daily - 2 sets - 10 reps ? ?ASSESSMENT: ? ?CLINICAL IMPRESSION: ?Patient presents with deficits following L peroneal tendon repair on 12/17/21. She is currently PWB in boot, with 2 crutches and scooter for mobility. She was educated on PWB status today, and practiced ambulation with 2 crutches and boot. We will clarify ROM and other restrictions at this time, with MD, prior to starting ther ex with foot. Pt with strength loss and significant functional limitations at this time, due to surgery and long duration immobilization . Pt with decreased ability for full functional activities at this time, due to pain and deficits. And will benefit from skilled PT to improve.  ? ?OBJECTIVE IMPAIRMENTS Abnormal gait,  decreased activity tolerance, decreased balance, decreased endurance, decreased knowledge of use of DME, decreased mobility, difficulty walking, decreased ROM, decreased strength, increased muscle spasms, impaired flexibility, and pain.  ? ?ACTIVITY LIMITATIONS cleaning, community activity, driving, meal prep, occupation, laundry, medication management, yard work, and shopping.  ? ?PERSONAL FACTORS  none  are also affecting patient's functional outcome.  ? ? ?REHAB POTENTIAL: Good ? ?CLINICAL DECISION MAKING: Stable/uncomplicated ? ?EVALUATION COMPLEXITY: Low ? ? ?GOALS: ?Goals reviewed with patient? Yes ? ?SHORT TERM GOALS: Target date: 02/13/2022 ? ?Pt to be independent with initial HEP ? ?Goal status: INITIAL ? ?2.  Pt to demo ability for independent ambulation, with PWB status and crutches, for at least 100 ft,  ? ?Goal status: INITIAL ? ?3.  Pt to be independent/safe for stair navigation with PWB status and use of crutches. ?  ?Goal status: INITIAL ? ? ? ?LONG TERM GOALS: Target date: 03/27/2022 ? ?Pt to be independent with final HEP ? ?Goal status: INITIAL ? ?2.  Pt to demo full ROM of L ankle, to be  WNL and pain free.  ? ?Goal status: INITIAL ? ?3.  Pt to demo improved strength and NMC of L ankle, to be WNL with standing and functional activity.  ? ?Goal status: INITIAL ? ?4.  Pt to demo ability for independen

## 2022-02-02 ENCOUNTER — Encounter: Payer: Self-pay | Admitting: Physical Therapy

## 2022-02-02 ENCOUNTER — Ambulatory Visit (INDEPENDENT_AMBULATORY_CARE_PROVIDER_SITE_OTHER): Payer: No Typology Code available for payment source | Admitting: Physical Therapy

## 2022-02-02 DIAGNOSIS — M25572 Pain in left ankle and joints of left foot: Secondary | ICD-10-CM

## 2022-02-02 DIAGNOSIS — R2689 Other abnormalities of gait and mobility: Secondary | ICD-10-CM

## 2022-02-02 NOTE — Therapy (Signed)
?OUTPATIENT PHYSICAL THERAPY LOWER EXTREMITY TREATMENT ? ?Patient Name: Emily [name] ?MRN: [id number] ?DOB:1976/08/14, 46 y.o., female ?Today's Date: 02/02/2022 ? ? PT End of Session - 02/02/22 1121   ? ? Visit Number 2   ? Number of Visits 12   ? Date for PT Re-Evaluation 03/13/22   ? Authorization Type [facility]   ? PT Start Time 1017   ? PT Stop Time 1056   ? PT Time Calculation (min) 39 min   ? Activity Tolerance Patient tolerated treatment well   ? Behavior During Therapy [facility] for tasks assessed/performed   ? ?  ?  ? ?  ? ? ? ?Past Medical History:  ?Diagnosis Date  ? B12 deficiency   ? Depression   ? History of COVID-19 2020  ? per pt mild symptoms that resolved  ? Hypermetropia of both eyes 09/12/2014  ? IDA (iron deficiency anemia)   ? Impingement syndrome of right shoulder 05/21/2015  ? Lactose intolerance   ? per pt due to gastric bypass  ? Morbid obesity ([place]) 08/15/2017  ? Nevus of choroid of left eye 09/12/2014  ? being monitored  ? Palpitations   ? due ot SVT  ? Psoriasis   ? Seborrheic dermatitis 02/08/2015  ? Status post gastric bypass for obesity 12/08/2016  ? SVT (supraventricular tachycardia) ([place])   ? cardiologist--- dr Edyth Gunnels;    normal echo in epic 02-17-2021 and event monitor 02-04-2021  symptomatic SVT w/ 5 brief episodes the longest 14 beats  ? Vitamin D deficiency   ? Wears glasses   ? ?Past Surgical History:  ?Procedure Laterality Date  ? COLPOSCOPY  2008  ? ENDOMETRIAL BIOPSY  2017  ? negative per pt  ? LAPAROSCOPIC CHOLECYSTECTOMY  2006  ? MASS EXCISION Left 08/17/2017  ? Procedure: EXCISION SOFT TISSUE  MASS LEFT FLANK;  Surgeon: [name], MD;  Location: [place];  Service: General;  Laterality: Left;  ? ROUX-EN-Y GASTRIC BYPASS  2007  ? TENDON REPAIR Left 12/17/2021  ? Procedure: TENDON REPAIR OF LEFT FOOT;  Surgeon: Trula Slade, DPM;  Location: [place];  Service: Podiatry;  Laterality: Left;  GENERAL WITH BLOCK  ? ?Patient Active  Problem List  ? Diagnosis Date Noted  ? Tear of peroneal tendon of left foot 12/03/2021  ? Benign paroxysmal positional vertigo of right ear 07/15/2021  ? Preventative health care 06/18/2021  ? Bronchitis 05/06/2021  ? Left foot pain 04/09/2021  ? Weight gain 04/09/2021  ? Supraventricular tachycardia ([place]) 03/12/2021  ? Dyspnea on exertion 01/10/2021  ? Lactose intolerance   ? Joint pain   ? Gallbladder problem   ? Palpitations   ? Back pain   ? Anemia   ? Allergy   ? Soft tissue mass 08/16/2017  ? Morbid obesity ([place]) 08/15/2017  ? Obesity (BMI 35.0-39.9 without comorbidity) 12/23/2016  ? Status post gastric bypass for obesity 12/08/2016  ? Vitamin B 12 deficiency 12/08/2016  ? Depression 12/08/2016  ? B12 deficiency 10/30/2016  ? Multiple food allergies 10/30/2016  ? Vitamin D deficiency 10/19/2016  ? AC (acromioclavicular) arthritis 05/21/2015  ? Acute pain of right shoulder 05/21/2015  ? Biceps tendonitis on right 05/21/2015  ? Impingement syndrome of right shoulder 05/21/2015  ? Seborrheic dermatitis 02/08/2015  ? Hypermetropia of both eyes 09/12/2014  ? Nevus of choroid of left eye 09/12/2014  ? Psoriasis 01/15/2014  ? Compound nevus 01/15/2014  ? ? ?PCP: [name] ? ?REFERRING PROVIDER: Celesta Gentile ? ?  REFERRING DIAG: S/P L peroneal tendon repair 12/17/21 ? ?THERAPY DIAG:  ?Pain in left ankle and joints of left foot ? ?Other abnormalities of gait and mobility ? ?ONSET DATE: 12/17/21   ? ?SUBJECTIVE:  ? ?SUBJECTIVE STATEMENT: ?No new complaints today. Top of foot still"tingly". She is doing better walking with 2 crutches.  ? ?Eval:Pt had L peroneal tendon repair on 12/17/21. She has been in boot and NWB since surgery. She had f/u appt 4/25. States PWB at this time, will clarify other movement restrictions.  ?Reports increased pain in L ankle Last may, no injury , but was exercising. Has been in boot since Nov due to pain prior to surgery.  Works full time Therapist, art , does standing and  sitting,  Still working from home a few days at this time post op.  ?Notes nerve pain on top of foot , just started gab pentin on Saturday for this.  ? ? ?PERTINENT HISTORY: ?Iron deficiency/anemia, SVT, ? ?PAIN:  ?Are you having pain? Yes: NPRS scale: more nerve pain 5/10 ?Pain location: L foot/ankle ?Pain description: burning ?Aggravating factors: at end of day ?Relieving factors: elevation ? ?PRECAUTIONS: None ? ?WEIGHT BEARING RESTRICTIONS Yes PWB  ? ?FALLS:  ?Has patient fallen in last 6 months? No ? ? ?PLOF: Independent ? ?PATIENT GOALS return to PLOF, no pain in foot.  ? ? ?OBJECTIVE:  ? ? ?COGNITION: ? Overall cognitive status: Within functional limits for tasks assessed   ?  ?SENSATION: ?Mild hypersensitivity on dorsum of foot with light touch, states worse when foot swollen at end of day.  ? ?PALPATION: ?Healing incision at lateral L ankle.  ? ?LE ROM: ?  01/30/22: Hips: WFL, Knee: WFL, Ankle: not tested, until we find out ROM/activity limitations for PT. Is able to move toes well.  ? ?LE MMT: ?  01/30/22:  Hips: 4/5, Knees: 4+/5 , Ankle: not tested.  ?  ? ? ?TODAY'S TREATMENT: ?02/02/22:  ?Ther ex:  Supine: ankle AROM 4 way x 10 ea; circles x 10; Toe flex/ext x 20;  Seated: LAQ x 20 on L, Reviewd SLR for HEP,  ?Standing: ambulation with 2 crutches and boot, education on optimal step length, 45 ft x 4;  ?Manual: STM to dorsum of foot and calf for circulation and improving sensation; PROM for DF, light gastroc stretch;  ? ? ?Eval:See above for gait training, AROM L toes x 10;  LAQ x 10 on L, SLR x 10 on L, education on HEP, Education on alternating foot positions on floor and being elevated. Discussed boot wearing and PWB status.  ? ? ?PATIENT EDUCATION:  ?Education details: updated and reviewed HEP ?Person educated: Patient ?Education method: Explanation, Demonstration, Tactile cues, Verbal cues, and Handouts ?Education comprehension: verbalized understanding, returned demonstration, verbal cues required,  tactile cues required, and needs further education ? ? ?HOME EXERCISE PROGRAM: ?Access Code: [email or web address] ? ? ?ASSESSMENT: ? ?CLINICAL IMPRESSION: ?Pt with improving ability for ambulation with 2 crutches, improved mechanics and ability today, cued for shorter stride length. Pt with no increase in pain with light AROM today, waiting for further participation restrictions from MD. Discussed continuing light massage for dorsum of foot to improve sensation and hypersensitivity.   ? ?OBJECTIVE IMPAIRMENTS Abnormal gait, decreased activity tolerance, decreased balance, decreased endurance, decreased knowledge of use of DME, decreased mobility, difficulty walking, decreased ROM, decreased strength, increased muscle spasms, impaired flexibility, and pain.  ? ?ACTIVITY LIMITATIONS cleaning, community activity, driving, meal prep, occupation, laundry, medication management, yard work,  and shopping.  ? ?PERSONAL FACTORS  none  are also affecting patient's functional outcome.  ? ? ?REHAB POTENTIAL: Good ? ?CLINICAL DECISION MAKING: Stable/uncomplicated ? ?EVALUATION COMPLEXITY: Low ? ? ?GOALS: ?Goals reviewed with patient? Yes ? ?SHORT TERM GOALS: Target date: 02/16/2022 ? ?Pt to be independent with initial HEP ? ?Goal status: INITIAL ? ?2.  Pt to demo ability for independent ambulation, with PWB status and crutches, for at least 100 ft,  ? ?Goal status: INITIAL ? ?3.  Pt to be independent/safe for stair navigation with PWB status and use of crutches. ?  ?Goal status: INITIAL ? ? ? ?LONG TERM GOALS: Target date: 03/30/2022 ? ?Pt to be independent with final HEP ? ?Goal status: INITIAL ? ?2.  Pt to demo full ROM of L ankle, to be WNL and pain free.  ? ?Goal status: INITIAL ? ?3.  Pt to demo improved strength and NMC of L ankle, to be WNL with standing and functional activity.  ? ?Goal status: INITIAL ? ?4.  Pt to demo ability for independent ambulation, with mechanics WNL,  for community distances, with pain 0-2/10, to return to  community and exercise activities.  ? ?Goal status: INITIAL ? ?5.  Pt to demo ability for SLS for at least 30 sec on L foot, to improve stability and NMC.  ? ?Goal status: INITIAL ? ? ?PLAN: ?PT FREQUENCY: 1-2x/week ? ?P

## 2022-02-04 ENCOUNTER — Encounter: Payer: No Typology Code available for payment source | Admitting: Physical Therapy

## 2022-02-05 ENCOUNTER — Encounter: Payer: Self-pay | Admitting: Physical Therapy

## 2022-02-05 ENCOUNTER — Ambulatory Visit (INDEPENDENT_AMBULATORY_CARE_PROVIDER_SITE_OTHER): Payer: No Typology Code available for payment source | Admitting: Physical Therapy

## 2022-02-05 DIAGNOSIS — M25572 Pain in left ankle and joints of left foot: Secondary | ICD-10-CM | POA: Diagnosis not present

## 2022-02-05 DIAGNOSIS — R2689 Other abnormalities of gait and mobility: Secondary | ICD-10-CM | POA: Diagnosis not present

## 2022-02-05 DIAGNOSIS — S86319A Strain of muscle(s) and tendon(s) of peroneal muscle group at lower leg level, unspecified leg, initial encounter: Secondary | ICD-10-CM | POA: Insufficient documentation

## 2022-02-05 NOTE — Therapy (Signed)
?OUTPATIENT PHYSICAL THERAPY LOWER EXTREMITY TREATMENT ? ?Patient Name: Emily [name] ?MRN: [id number] ?DOB:March 27, 1976, 46 y.o., female ?Today's Date: 02/05/2022 ? ? PT End of Session - 02/05/22 1255   ? ? Visit Number 3   ? Number of Visits 12   ? Date for PT Re-Evaluation 03/13/22   ? Authorization Type [facility]   ? PT Start Time 1258   ? PT Stop Time 8466   ? PT Time Calculation (min) 41 min   ? Activity Tolerance Patient tolerated treatment well   ? Behavior During Therapy [facility] for tasks assessed/performed   ? ?  ?  ? ?  ? ? ? ?Past Medical History:  ?Diagnosis Date  ? B12 deficiency   ? Depression   ? History of COVID-19 2020  ? per pt mild symptoms that resolved  ? Hypermetropia of both eyes 09/12/2014  ? IDA (iron deficiency anemia)   ? Impingement syndrome of right shoulder 05/21/2015  ? Lactose intolerance   ? per pt due to gastric bypass  ? Morbid obesity ([place]) 08/15/2017  ? Nevus of choroid of left eye 09/12/2014  ? being monitored  ? Palpitations   ? due ot SVT  ? Psoriasis   ? Seborrheic dermatitis 02/08/2015  ? Status post gastric bypass for obesity 12/08/2016  ? SVT (supraventricular tachycardia) ([place])   ? cardiologist--- dr Edyth Gunnels;    normal echo in epic 02-17-2021 and event monitor 02-04-2021  symptomatic SVT w/ 5 brief episodes the longest 14 beats  ? Vitamin D deficiency   ? Wears glasses   ? ?Past Surgical History:  ?Procedure Laterality Date  ? COLPOSCOPY  2008  ? ENDOMETRIAL BIOPSY  2017  ? negative per pt  ? LAPAROSCOPIC CHOLECYSTECTOMY  2006  ? MASS EXCISION Left 08/17/2017  ? Procedure: EXCISION SOFT TISSUE  MASS LEFT FLANK;  Surgeon: [name], MD;  Location: [place];  Service: General;  Laterality: Left;  ? ROUX-EN-Y GASTRIC BYPASS  2007  ? TENDON REPAIR Left 12/17/2021  ? Procedure: TENDON REPAIR OF LEFT FOOT;  Surgeon: Trula Slade, DPM;  Location: [place];  Service: Podiatry;  Laterality: Left;  GENERAL WITH BLOCK  ? ?Patient Active  Problem List  ? Diagnosis Date Noted  ? Tear of peroneal tendon 02/05/2022  ? Tear of peroneal tendon of left foot 12/03/2021  ? Benign paroxysmal positional vertigo of right ear 07/15/2021  ? Preventative health care 06/18/2021  ? Bronchitis 05/06/2021  ? Left foot pain 04/09/2021  ? Weight gain 04/09/2021  ? Supraventricular tachycardia ([place]) 03/12/2021  ? Dyspnea on exertion 01/10/2021  ? Lactose intolerance   ? Joint pain   ? Gallbladder problem   ? Palpitations   ? Back pain   ? Anemia   ? Allergy   ? Soft tissue mass 08/16/2017  ? Morbid obesity ([place]) 08/15/2017  ? Obesity (BMI 35.0-39.9 without comorbidity) 12/23/2016  ? Status post gastric bypass for obesity 12/08/2016  ? Vitamin B 12 deficiency 12/08/2016  ? Depression 12/08/2016  ? B12 deficiency 10/30/2016  ? Multiple food allergies 10/30/2016  ? Vitamin D deficiency 10/19/2016  ? AC (acromioclavicular) arthritis 05/21/2015  ? Acute pain of right shoulder 05/21/2015  ? Biceps tendonitis on right 05/21/2015  ? Impingement syndrome of right shoulder 05/21/2015  ? Seborrheic dermatitis 02/08/2015  ? Hypermetropia of both eyes 09/12/2014  ? Nevus of choroid of left eye 09/12/2014  ? Psoriasis 01/15/2014  ? Compound nevus 01/15/2014  ? ? ?PCP:  [name] ? ?REFERRING PROVIDER: Celesta Gentile ? ?REFERRING DIAG: S/P L peroneal tendon repair 12/17/21 ? ?THERAPY DIAG:  ?Pain in left ankle and joints of left foot ? ?Other abnormalities of gait and mobility ? ?ONSET DATE: 12/17/21   ? ?SUBJECTIVE:  ? ?SUBJECTIVE STATEMENT: ?No new complaints today.  She is doing better walking with 2 crutches.  ? ?Eval:Pt had L peroneal tendon repair on 12/17/21. She has been in boot and NWB since surgery. She had f/u appt 4/25. States PWB at this time, will clarify other movement restrictions.  ?Reports increased pain in L ankle Last may, no injury , but was exercising. Has been in boot since Nov due to pain prior to surgery.  Works full time Therapist, art , does  standing and sitting,  Still working from home a few days at this time post op.  ?Notes nerve pain on top of foot , just started gab pentin on Saturday for this.  ? ? ?PERTINENT HISTORY: ?Iron deficiency/anemia, SVT, ? ?PAIN:  ?Are you having pain? Yes: NPRS scale: more nerve pain 5/10 ?Pain location: L foot/ankle ?Pain description: burning ?Aggravating factors: at end of day ?Relieving factors: elevation ? ?PRECAUTIONS: None ? ?WEIGHT BEARING RESTRICTIONS Yes PWB  ? ?FALLS:  ?Has patient fallen in last 6 months? No ? ? ?PLOF: Independent ? ?PATIENT GOALS return to PLOF, no pain in foot.  ? ? ?OBJECTIVE:  ? ? ?COGNITION: ? Overall cognitive status: Within functional limits for tasks assessed   ?  ?SENSATION: ?Mild hypersensitivity on dorsum of foot with light touch, states worse when foot swollen at end of day.  ? ?PALPATION: ?Healing incision at lateral L ankle.  ? ?LE ROM: ?  01/30/22: Hips: WFL, Knee: WFL, Ankle: not tested, until we find out ROM/activity limitations for PT. Is able to move toes well.  ? ?LE MMT: ?  01/30/22:  Hips: 4/5, Knees: 4+/5 , Ankle: not tested.  ?  ? ? ?TODAY'S TREATMENT: ?02/05/22:  ?Ther ex:   ?Supine: ankle AROM 4 way x 10 ea; circles x 10; Toe flex/ext x 20;  SLR x 10 bil;  ?Seated: LAQ x 20 on L, R 3 lb;  BAPS pf/df/inv/ev, circles bil Level 3; HR x 10;  ?Standing:  ?Manual: PROM for all motions, light distraction of rearfoot , DF with slight overpressure for DF stretch;  ? ? ? ?PATIENT EDUCATION:  ?Education details:  reviewed HEP and current precautions.  ?Person educated: Patient ?Education method: Explanation, Demonstration, Tactile cues, Verbal cues, and Handouts ?Education comprehension: verbalized understanding, returned demonstration, verbal cues required, tactile cues required, and needs further education ? ? ?HOME EXERCISE PROGRAM: ?Access Code: [email or web address] ? ? ?ASSESSMENT: ? ?CLINICAL IMPRESSION: ?Pt with improving ability for ROM today. Minimal joint stiffness. She will  continue to wear boot per MD recommendations. Will see 1x next week, and will f/u with MD after that. Incision still not fully healed. Minimal drainage, appears to be clear at this time.   ? ?OBJECTIVE IMPAIRMENTS Abnormal gait, decreased activity tolerance, decreased balance, decreased endurance, decreased knowledge of use of DME, decreased mobility, difficulty walking, decreased ROM, decreased strength, increased muscle spasms, impaired flexibility, and pain.  ? ?ACTIVITY LIMITATIONS cleaning, community activity, driving, meal prep, occupation, laundry, medication management, yard work, and shopping.  ? ?PERSONAL FACTORS  none  are also affecting patient's functional outcome.  ? ? ?REHAB POTENTIAL: Good ? ?CLINICAL DECISION MAKING: Stable/uncomplicated ? ?EVALUATION COMPLEXITY: Low ? ? ?GOALS: ?Goals reviewed with patient? Yes ? ?SHORT  TERM GOALS: Target date: 02/19/2022 ? ?Pt to be independent with initial HEP ? ?Goal status: INITIAL ? ?2.  Pt to demo ability for independent ambulation, with PWB status and crutches, for at least 100 ft,  ? ?Goal status: INITIAL ? ?3.  Pt to be independent/safe for stair navigation with PWB status and use of crutches. ?  ?Goal status: INITIAL ? ? ? ?LONG TERM GOALS: Target date: 04/02/2022 ? ?Pt to be independent with final HEP ? ?Goal status: INITIAL ? ?2.  Pt to demo full ROM of L ankle, to be WNL and pain free.  ? ?Goal status: INITIAL ? ?3.  Pt to demo improved strength and NMC of L ankle, to be WNL with standing and functional activity.  ? ?Goal status: INITIAL ? ?4.  Pt to demo ability for independent ambulation, with mechanics WNL,  for community distances, with pain 0-2/10, to return to community and exercise activities.  ? ?Goal status: INITIAL ? ?5.  Pt to demo ability for SLS for at least 30 sec on L foot, to improve stability and NMC.  ? ?Goal status: INITIAL ? ? ?PLAN: ?PT FREQUENCY: 1-2x/week ? ?PT DURATION: 8 weeks ? ?PLANNED INTERVENTIONS: Therapeutic exercises,  Therapeutic activity, Neuromuscular re-education, Balance training, Gait training, Patient/Family education, Joint manipulation, Joint mobilization, Stair training, Orthotic/Fit training, DME instructions, Dry Needling, Elec

## 2022-02-05 NOTE — Progress Notes (Signed)
Subjective: ?Emily [name] is a 46 y.o. is seen today in office s/p left peroneal tendon repair preformed on 12/17/2021.  States that she has been doing well.  She has been nonweightbearing.  She is taking the gabapentin daily. Denies any fevers or chills.  She has no other concerns today. ? ?Objective: ?General: No acute distress, AAOx3  ?DP/PT pulses palpable 2/4, CRT < 3 sec to all digits.  ?Protective sensation intact. Motor function intact.  ?left foot: Incision is well coapted without any evidence of dehiscence.  Scabbing is present along the incision.  She is getting numbness and burning the top of her foot and there is a positive Tinel's sign along the superficial peroneal nerve.  There is minimal edema on the surgical site.  No erythema or warmth.  No signs of dehiscence noted today.  Mild tenderness palpation at surgical site but no other areas of discomfort. ?No pain with calf compression, swelling, warmth, erythema.  ? ?Assessment and Plan:  ?Status post left peroneal tendon repair; neuritis ? ?-Treatment options discussed including all alternatives, risks, and complications ?-I do think some of the neuritis symptoms is coming from the boot, compression.  Continue gabapentin.  At this point we will also start physical therapy and a prescription for physical therapy was written today.  She can start to transition to partial weightbearing in the cam boot.  Work range of motion, strengthening exercises.  Continue ice and elevate as well as compression. ? ?Trula Slade DPM ?

## 2022-02-10 ENCOUNTER — Encounter: Payer: No Typology Code available for payment source | Admitting: Physical Therapy

## 2022-02-11 ENCOUNTER — Encounter: Payer: No Typology Code available for payment source | Admitting: Physical Therapy

## 2022-02-13 ENCOUNTER — Ambulatory Visit (INDEPENDENT_AMBULATORY_CARE_PROVIDER_SITE_OTHER): Payer: No Typology Code available for payment source | Admitting: Physical Therapy

## 2022-02-13 DIAGNOSIS — R2689 Other abnormalities of gait and mobility: Secondary | ICD-10-CM | POA: Diagnosis not present

## 2022-02-13 DIAGNOSIS — M25572 Pain in left ankle and joints of left foot: Secondary | ICD-10-CM | POA: Diagnosis not present

## 2022-02-13 NOTE — Therapy (Signed)
?OUTPATIENT PHYSICAL THERAPY LOWER EXTREMITY TREATMENT ? ?Patient Name: Emily [name] ?MRN: [id number] ?DOB:[birth year], 46 y.o., female ?Today's Date: 02/13/2022 ? ? PT End of Session - 02/13/22 1146   ? ? Visit Number 4   ? Number of Visits 12   ? Date for PT Re-Evaluation 03/13/22   ? Authorization Type [facility]   ? PT Start Time 1100   ? PT Stop Time 8315   ? PT Time Calculation (min) 38 min   ? Activity Tolerance Patient tolerated treatment well   ? Behavior During Therapy [facility] for tasks assessed/performed   ? ?  ?  ? ?  ? ? ? ? ?Past Medical History:  ?Diagnosis Date  ? B12 deficiency   ? Depression   ? History of COVID-19 2020  ? per pt mild symptoms that resolved  ? Hypermetropia of both eyes 09/12/2014  ? IDA (iron deficiency anemia)   ? Impingement syndrome of right shoulder 05/21/2015  ? Lactose intolerance   ? per pt due to gastric bypass  ? Morbid obesity ([place]) 08/15/2017  ? Nevus of choroid of left eye 09/12/2014  ? being monitored  ? Palpitations   ? due ot SVT  ? Psoriasis   ? Seborrheic dermatitis 02/08/2015  ? Status post gastric bypass for obesity 12/08/2016  ? SVT (supraventricular tachycardia) ([place])   ? cardiologist--- dr Edyth Gunnels;    normal echo in epic 02-17-2021 and event monitor 02-04-2021  symptomatic SVT w/ 5 brief episodes the longest 14 beats  ? Vitamin D deficiency   ? Wears glasses   ? ?Past Surgical History:  ?Procedure Laterality Date  ? COLPOSCOPY  2008  ? ENDOMETRIAL BIOPSY  2017  ? negative per pt  ? LAPAROSCOPIC CHOLECYSTECTOMY  2006  ? MASS EXCISION Left 08/17/2017  ? Procedure: EXCISION SOFT TISSUE  MASS LEFT FLANK;  Surgeon: [name], MD;  Location: [place];  Service: General;  Laterality: Left;  ? ROUX-EN-Y GASTRIC BYPASS  2007  ? TENDON REPAIR Left 12/17/2021  ? Procedure: TENDON REPAIR OF LEFT FOOT;  Surgeon: Trula Slade, DPM;  Location: [place];  Service: Podiatry;  Laterality: Left;  GENERAL WITH BLOCK  ? ?Patient Active  Problem List  ? Diagnosis Date Noted  ? Tear of peroneal tendon 02/05/2022  ? Tear of peroneal tendon of left foot 12/03/2021  ? Benign paroxysmal positional vertigo of right ear 07/15/2021  ? Preventative health care 06/18/2021  ? Bronchitis 05/06/2021  ? Left foot pain 04/09/2021  ? Weight gain 04/09/2021  ? Supraventricular tachycardia ([place]) 03/12/2021  ? Dyspnea on exertion 01/10/2021  ? Lactose intolerance   ? Joint pain   ? Gallbladder problem   ? Palpitations   ? Back pain   ? Anemia   ? Allergy   ? Soft tissue mass 08/16/2017  ? Morbid obesity ([place]) 08/15/2017  ? Obesity (BMI 35.0-39.9 without comorbidity) 12/23/2016  ? Status post gastric bypass for obesity 12/08/2016  ? Vitamin B 12 deficiency 12/08/2016  ? Depression 12/08/2016  ? B12 deficiency 10/30/2016  ? Multiple food allergies 10/30/2016  ? Vitamin D deficiency 10/19/2016  ? AC (acromioclavicular) arthritis 05/21/2015  ? Acute pain of right shoulder 05/21/2015  ? Biceps tendonitis on right 05/21/2015  ? Impingement syndrome of right shoulder 05/21/2015  ? Seborrheic dermatitis 02/08/2015  ? Hypermetropia of both eyes 09/12/2014  ? Nevus of choroid of left eye 09/12/2014  ? Psoriasis 01/15/2014  ? Compound nevus 01/15/2014  ? ? ?  PCP: [name] ? ?REFERRING PROVIDER: Celesta Gentile ? ?REFERRING DIAG: S/P L peroneal tendon repair 12/17/21 ? ?THERAPY DIAG:  ?Pain in left ankle and joints of left foot ? ?Other abnormalities of gait and mobility ? ?ONSET DATE: 12/17/21   ? ?SUBJECTIVE:  ? ?SUBJECTIVE STATEMENT: ?No new complaints today.  She is doing better walking with  crutches.  ? ?Eval:Pt had L peroneal tendon repair on 12/17/21. She has been in boot and NWB since surgery. She had f/u appt 4/25. States PWB at this time, will clarify other movement restrictions.  ?Reports increased pain in L ankle Last may, no injury , but was exercising. Has been in boot since Nov due to pain prior to surgery.  Works full time Therapist, art , does standing  and sitting,  Still working from home a few days at this time post op.  ?Notes nerve pain on top of foot , just started gab pentin on Saturday for this.  ? ? ?PERTINENT HISTORY: ?Iron deficiency/anemia, SVT, ? ?PAIN:  ?Are you having pain? Yes: NPRS scale: more nerve pain 5/10 ?Pain location: L foot/ankle ?Pain description: burning ?Aggravating factors: at end of day ?Relieving factors: elevation ? ?PRECAUTIONS: None ? ?WEIGHT BEARING RESTRICTIONS Yes PWB  ? ?FALLS:  ?Has patient fallen in last 6 months? No ? ? ?PLOF: Independent ? ?PATIENT GOALS return to PLOF, no pain in foot.  ? ? ?OBJECTIVE:  ? ? ?COGNITION: ? Overall cognitive status: Within functional limits for tasks assessed   ?  ?SENSATION: ?Mild hypersensitivity on dorsum of foot with light touch, states worse when foot swollen at end of day.  ? ?PALPATION: ?Healing incision at lateral L ankle.  ? ?LE ROM: ?  01/30/22: Hips: WFL, Knee: WFL, Ankle: not tested, until we find out ROM/activity limitations for PT. Is able to move toes well.  ? ?LE MMT: ?  01/30/22:  Hips: 4/5, Knees: 4+/5 , Ankle: not tested.  ?  ? ? ?TODAY'S TREATMENT: ?02/13/22:  ?Ther ex:   ?Supine: ankle AROM 4 way x 10 ea; circles x 10; Toe flex/ext x 20;  SLR x 10 bil;  EV isometric x15, 3 sec holds;  ?Seated:   BAPS pf/df/inv/ev, circles bil Level 3;  HR x 15;  ?Standing:  ?Manual: PROM for all motions, light distraction of rearfoot , DF stretch;A/P mobilization to inc DF;  ? ? ? ?PATIENT EDUCATION:  ?Education details:  reviewed HEP and current precautions.  ?Person educated: Patient ?Education method: Explanation, Demonstration, Tactile cues, Verbal cues, and Handouts ?Education comprehension: verbalized understanding, returned demonstration, verbal cues required, tactile cues required, and needs further education ? ? ?HOME EXERCISE PROGRAM: ?Access Code: [email or web address] ? ? ?ASSESSMENT: ? ?CLINICAL IMPRESSION: ?Pt with improving ROM and function. Doing much better with ability and mechanics for  ambulation with boot and crutches. No increased pain today with ROM and light strength with ther ex. Plan to progress as tolerated. Pt with MD appt early next week, will return to PT later in week.  ? ? ?OBJECTIVE IMPAIRMENTS Abnormal gait, decreased activity tolerance, decreased balance, decreased endurance, decreased knowledge of use of DME, decreased mobility, difficulty walking, decreased ROM, decreased strength, increased muscle spasms, impaired flexibility, and pain.  ? ?ACTIVITY LIMITATIONS cleaning, community activity, driving, meal prep, occupation, laundry, medication management, yard work, and shopping.  ? ?PERSONAL FACTORS  none  are also affecting patient's functional outcome.  ? ? ?REHAB POTENTIAL: Good ? ?CLINICAL DECISION MAKING: Stable/uncomplicated ? ?EVALUATION COMPLEXITY: Low ? ? ?GOALS: ?Goals  reviewed with patient? Yes ? ?SHORT TERM GOALS: Target date: 02/27/2022 ? ?Pt to be independent with initial HEP ? ?Goal status: INITIAL ? ?2.  Pt to demo ability for independent ambulation, with PWB status and crutches, for at least 100 ft,  ? ?Goal status: INITIAL ? ?3.  Pt to be independent/safe for stair navigation with PWB status and use of crutches. ?  ?Goal status: INITIAL ? ? ? ?LONG TERM GOALS: Target date: 04/10/2022 ? ?Pt to be independent with final HEP ? ?Goal status: INITIAL ? ?2.  Pt to demo full ROM of L ankle, to be WNL and pain free.  ? ?Goal status: INITIAL ? ?3.  Pt to demo improved strength and NMC of L ankle, to be WNL with standing and functional activity.  ? ?Goal status: INITIAL ? ?4.  Pt to demo ability for independent ambulation, with mechanics WNL,  for community distances, with pain 0-2/10, to return to community and exercise activities.  ? ?Goal status: INITIAL ? ?5.  Pt to demo ability for SLS for at least 30 sec on L foot, to improve stability and NMC.  ? ?Goal status: INITIAL ? ? ?PLAN: ?PT FREQUENCY: 1-2x/week ? ?PT DURATION: 8 weeks ? ?PLANNED INTERVENTIONS: Therapeutic  exercises, Therapeutic activity, Neuromuscular re-education, Balance training, Gait training, Patient/Family education, Joint manipulation, Joint mobilization, Stair training, Orthotic/Fit training, DME in

## 2022-02-17 ENCOUNTER — Ambulatory Visit (INDEPENDENT_AMBULATORY_CARE_PROVIDER_SITE_OTHER): Payer: No Typology Code available for payment source | Admitting: Podiatry

## 2022-02-17 ENCOUNTER — Encounter: Payer: No Typology Code available for payment source | Admitting: Physical Therapy

## 2022-02-17 ENCOUNTER — Ambulatory Visit (INDEPENDENT_AMBULATORY_CARE_PROVIDER_SITE_OTHER): Payer: No Typology Code available for payment source

## 2022-02-17 ENCOUNTER — Other Ambulatory Visit (HOSPITAL_BASED_OUTPATIENT_CLINIC_OR_DEPARTMENT_OTHER): Payer: Self-pay

## 2022-02-17 DIAGNOSIS — M79672 Pain in left foot: Secondary | ICD-10-CM

## 2022-02-17 DIAGNOSIS — M7672 Peroneal tendinitis, left leg: Secondary | ICD-10-CM

## 2022-02-17 DIAGNOSIS — M792 Neuralgia and neuritis, unspecified: Secondary | ICD-10-CM | POA: Diagnosis not present

## 2022-02-17 DIAGNOSIS — M25571 Pain in right ankle and joints of right foot: Secondary | ICD-10-CM | POA: Diagnosis not present

## 2022-02-17 NOTE — Progress Notes (Signed)
Subjective: ?Emily [name] is a 46 y.o. is seen today in office s/p left peroneal tendon repair preformed on 12/17/2021.  States that she has been doing well.  She been partial weightbearing in the cam boot.  She does state at home she does walk in the cam boot without the crutches.  Does not cause pain and is more comfortable.  No recent injury or changes otherwise.  No fevers or chills. ? ?Objective: ?General: No acute distress, AAOx3  ?DP/PT pulses palpable 2/4, CRT < 3 sec to all digits.  ?Protective sensation intact. Motor function intact.  ?left foot: Incision is well coapted without any evidence of dehiscence.  Minimal scabbing present but the incision is well coapted.  There is slight edema but there is no erythema or warmth.  No significant tenderness palpation of the surgical site.  She has some numbness on the incision.  She still gets some neuritis symptoms mostly in the superficial peroneal nerve. ?No pain with calf compression, swelling, warmth, erythema.  ? ?Assessment and Plan:  ?Status post left peroneal tendon repair; neuritis ? ?-Treatment options discussed including all alternatives, risks, and complications ?-X-rays obtained reviewed.  No subacute fracture.  Osteopenia noted. ?-At this point we discussed transition to weightbearing as tolerated in the cam boot.  As she does this and she continues with physical therapy she can transition to regular shoe as tolerated.  Continue ice, elevate as well as compression socks which already has at home.  I saw that the nerve symptoms are causing compression, the cam boot and help with this resolves over time we will continue to monitor. ? ?No x-rays needed next appointment unless there is an injury ? ?Trula Slade DPM ?

## 2022-02-18 ENCOUNTER — Other Ambulatory Visit (HOSPITAL_BASED_OUTPATIENT_CLINIC_OR_DEPARTMENT_OTHER): Payer: Self-pay

## 2022-02-18 ENCOUNTER — Encounter: Payer: No Typology Code available for payment source | Admitting: Physical Therapy

## 2022-02-18 ENCOUNTER — Ambulatory Visit (INDEPENDENT_AMBULATORY_CARE_PROVIDER_SITE_OTHER): Payer: No Typology Code available for payment source | Admitting: Family

## 2022-02-18 VITALS — BP 110/40 | HR 68 | Temp 97.9°F | Resp 16 | Wt 205.0 lb

## 2022-02-18 DIAGNOSIS — Z1211 Encounter for screening for malignant neoplasm of colon: Secondary | ICD-10-CM | POA: Diagnosis not present

## 2022-02-18 DIAGNOSIS — E669 Obesity, unspecified: Secondary | ICD-10-CM

## 2022-02-18 DIAGNOSIS — E559 Vitamin D deficiency, unspecified: Secondary | ICD-10-CM

## 2022-02-18 DIAGNOSIS — D649 Anemia, unspecified: Secondary | ICD-10-CM | POA: Diagnosis not present

## 2022-02-18 LAB — CBC WITH DIFFERENTIAL/PLATELET
Basophils Absolute: 0 10*3/uL (ref 0.0–0.1)
Basophils Relative: 0.6 % (ref 0.0–3.0)
Eosinophils Absolute: 0.1 10*3/uL (ref 0.0–0.7)
Eosinophils Relative: 1.6 % (ref 0.0–5.0)
HCT: 36.9 % (ref 36.0–46.0)
Hemoglobin: 12 g/dL (ref 12.0–15.0)
Lymphocytes Relative: 28.7 % (ref 12.0–46.0)
Lymphs Abs: 2.1 10*3/uL (ref 0.7–4.0)
MCHC: 32.5 g/dL (ref 30.0–36.0)
MCV: 79 fl (ref 78.0–100.0)
Monocytes Absolute: 0.4 10*3/uL (ref 0.1–1.0)
Monocytes Relative: 5.4 % (ref 3.0–12.0)
Neutro Abs: 4.7 10*3/uL (ref 1.4–7.7)
Neutrophils Relative %: 63.7 % (ref 43.0–77.0)
Platelets: 393 10*3/uL (ref 150.0–400.0)
RBC: 4.68 Mil/uL (ref 3.87–5.11)
RDW: 17.7 % — ABNORMAL HIGH (ref 11.5–15.5)
WBC: 7.4 10*3/uL (ref 4.0–10.5)

## 2022-02-18 LAB — FERRITIN: Ferritin: 7 ng/mL — ABNORMAL LOW (ref 10.0–291.0)

## 2022-02-18 LAB — VITAMIN D 25 HYDROXY (VIT D DEFICIENCY, FRACTURES): VITD: 33.1 ng/mL (ref 30.00–100.00)

## 2022-02-18 MED ORDER — IRON 325 (65 FE) MG PO TABS: 1.0000 | ORAL_TABLET | ORAL | 0 refills | Status: AC

## 2022-02-18 MED ORDER — CLOBETASOL PROPIONATE 0.05 % EX SOLN
1.0000 "application " | Freq: Two times a day (BID) | CUTANEOUS | 1 refills | Status: DC
  Filled 2022-02-18: qty 50, 25d supply, fill #0

## 2022-02-18 MED ORDER — BUPROPION HCL ER (XL) 150 MG PO TB24
ORAL_TABLET | Freq: Every day | ORAL | 1 refills | Status: DC
  Filled 2022-02-18: qty 90, 90d supply, fill #0
  Filled 2022-06-05: qty 90, 90d supply, fill #1

## 2022-02-18 MED ORDER — WEGOVY 0.25 MG/0.5ML ~~LOC~~ SOAJ
0.2500 mg | SUBCUTANEOUS | 0 refills | Status: DC
  Filled 2022-02-18: qty 2, 28d supply, fill #0

## 2022-02-18 MED ORDER — GABAPENTIN 100 MG PO CAPS
100.0000 mg | ORAL_CAPSULE | Freq: Three times a day (TID) | ORAL | 2 refills | Status: DC
  Filled 2022-02-18: qty 90, 30d supply, fill #0
  Filled 2022-03-20: qty 90, 30d supply, fill #1
  Filled 2022-05-06: qty 90, 30d supply, fill #2

## 2022-02-18 NOTE — Progress Notes (Signed)
? ?Subjective:  ? ? ? Patient ID: Emily [name], female    DOB: 1975-12-10, 46 y.o.   MRN: [id number] ? ?Chief Complaint  ?Patient presents with  ? Obesity  ?  Follow up, had to come off mounjaro due to insurance issues.   ? ? ?HPI ?Patient is in today for follow up on her weight loss medication. She has been having success with mounjaro. She is down 33 pounds since she started. She continues to try to follow a weight watcher's plan.  She notes chronic knee and back pain as a result of her obesity. Unfortunately, her insurance will not continue to pay for mounjaro.  ? ? ?R foot pain.  Following her surgery, she had a lot of foot pain. We added Gabapentin. Notes that this has helped the frequency of her pain.  ? ?Health Maintenance Due  ?Topic Date Due  ? COLONOSCOPY (Pts 45-31yr Insurance coverage will need to be confirmed)  Never done  ? ? ?Past Medical History:  ?Diagnosis Date  ? B12 deficiency   ? Depression   ? History of COVID-19 2020  ? per pt mild symptoms that resolved  ? Hypermetropia of both eyes 09/12/2014  ? IDA (iron deficiency anemia)   ? Impingement syndrome of right shoulder 05/21/2015  ? Lactose intolerance   ? per pt due to gastric bypass  ? [place] 08/15/2017  ? Nevus of choroid of left eye 09/12/2014  ? being monitored  ? Palpitations   ? due ot SVT  ? Psoriasis   ? Seborrheic dermatitis 02/08/2015  ? Status post gastric bypass for obesity 12/08/2016  ? SVT (supraventricular tachycardia) ([facility]   ? cardiologist--- dr kEdyth Gunnels    normal echo in epic 02-17-2021 and event monitor 02-04-2021  symptomatic SVT w/ 5 brief episodes the longest 14 beats  ? Vitamin D deficiency   ? Wears glasses   ? ? ?Past Surgical History:  ?Procedure Laterality Date  ? COLPOSCOPY  2008  ? ENDOMETRIAL BIOPSY  2017  ? negative per pt  ? LAPAROSCOPIC CHOLECYSTECTOMY  2006  ? MASS EXCISION Left 08/17/2017  ? Procedure: EXCISION SOFT TISSUE  MASS LEFT FLANK;  Surgeon: GArmandina Gemma MD;  Location[facility]  Service: General;  Laterality: Left;  ? ROUX-EN-Y GASTRIC BYPASS  2007  ? TENDON REPAIR Left 12/17/2021  ? Procedure: TENDON REPAIR OF LEFT FOOT;  Surgeon: WTrula Slade DPM;  Location[facility]  Service: Podiatry;  Laterality: Left;  GENERAL WITH BLOCK  ? ? ?Family History  ?Problem Relation Age of Onset  ? Hypertension Mother   ? Obesity Mother   ? Hypertension Father   ? Sleep apnea Father   ? Obesity Father   ? Colon polyps Father   ? Alzheimer's disease Maternal Grandmother   ? Leukemia Maternal Grandfather   ? Alzheimer's disease Paternal Grandmother   ? Cancer Paternal Grandfather   ? Diabetes type II Maternal Aunt   ? Stroke Maternal Aunt   ? Multiple sclerosis Maternal Aunt   ? Heart attack Paternal Aunt   ? ? ?Social History  ? ?Socioeconomic History  ? Marital status: Single  ?  Spouse name: Not on file  ? Number of children: Not on file  ? Years of education: Not on file  ? Highest education level: Not on file  ?Occupational History  ? Occupation: RTherapist, sports ?  Employer: [employer]  ?Tobacco Use  ? Smoking status: Never  ?  Passive exposure: Never  ? Smokeless tobacco: Never  ?Vaping Use  ? Vaping Use: Never used  ?Substance and Sexual Activity  ? Alcohol use: Yes  ?  Alcohol/week: 3.0 standard drinks  ?  Types: 3 Glasses of wine per week  ? Drug use: Never  ? Sexual activity: Yes  ?  Birth control/protection: Pill  ?Other Topics Concern  ? Not on file  ?Social History Narrative  ? Works as a Engineer, maintenance at [employer]  ? No children  ? Not married  ? Has a dog named Lorre Nick  ? From [place]- moved to [place] in 2002  ? Parents are both living  ? Has one sister back home  ? Enjoys going out to eat with friends  ? Enjoys biking and kayaking.   ? Goes to the GYM 6 days a week  ? ?Social Determinants of Health  ? ?Financial Resource Strain: Not on file  ?Food Insecurity: Not on file  ?Transportation Needs: Not on file  ?Physical Activity: Not on file  ?Stress: Not on  file  ?Social Connections: Not on file  ?Intimate Partner Violence: Not on file  ? ? ?Outpatient Medications Prior to Visit  ?Medication Sig Dispense Refill  ? cyanocobalamin (,VITAMIN B-12,) 1000 MCG/ML injection Inject 1 mL (1,000 mcg total) into the muscle once a week. (Patient taking differently: Inject 1,000 mcg into the muscle once a week. Sunday's) 12 mL 2  ? diltiazem (CARDIZEM CD) 180 MG 24 hr capsule Take 1 capsule (180 mg total) by mouth daily. (Patient taking differently: Take 180 mg by mouth daily.) 90 capsule 3  ? docusate sodium (COLACE) 100 MG capsule Take 100 mg by mouth every other day.    ? folic acid (FOLVITE) 1 MG tablet Take 1 mg by mouth daily.    ? ketoconazole (NIZORAL) 2 % shampoo Apply 1 application. topically daily.    ? norethindrone-ethinyl estradiol-iron (MICROGESTIN FE 1.5/30) 1.5-30 MG-MCG tablet Take 1 tablet by mouth daily. (Patient taking differently: Take 1 tablet by mouth daily.) 84 tablet 4  ? tirzepatide (MOUNJARO) 12.5 MG/0.5ML Pen Inject 12.5 mg into the skin once a week. (Patient taking differently: Inject 12.5 mg into the skin once a week. Monday's) 6 mL 1  ? Vitamin D, Ergocalciferol, (DRISDOL) 1.25 MG (50000 UNIT) CAPS capsule Take 1 capsule (50,000 Units total) by mouth every 7 (seven) days. 12 capsule 0  ? buPROPion (WELLBUTRIN XL) 150 MG 24 hr tablet TAKE 1 TABLET (150 MG TOTAL) BY MOUTH DAILY. (Patient taking differently: Take 150 mg by mouth daily.) 90 tablet 1  ? cephALEXin (KEFLEX) 500 MG capsule Take 1 capsule (500 mg total) by mouth 3 (three) times daily. 21 capsule 0  ? clobetasol ointment (TEMOVATE) 4.03 % Apply 1 application. topically daily.    ? Ferrous Sulfate (IRON) 325 (65 Fe) MG TABS Take 1 tablet (325 mg total) by mouth every other day. (Patient taking differently: Take 1 tablet by mouth every other day.) 30 tablet 0  ? gabapentin (NEURONTIN) 100 MG capsule Take 1 capsule (100 mg total) by mouth 3 (three) times daily. 90 capsule 0  ? meclizine  (ANTIVERT) 25 MG tablet Take 1 tablet (25 mg total) by mouth 3 (three) times daily as needed for dizziness. (Patient taking differently: Take 25 mg by mouth 3 (three) times daily as needed for dizziness.) 30 tablet 0  ? oxyCODONE-acetaminophen (PERCOCET/ROXICET) 5-325 MG tablet Take 1 to 2 tablets by mouth every 6 hours as  needed for severe pain. 20 tablet 0  ? promethazine (PHENERGAN) 25 MG tablet Take 1 tablet (25 mg total) by mouth every 8 (eight) hours as needed for nausea or vomiting. 20 tablet 0  ? ?No facility-administered medications prior to visit.  ? ? ?Allergies  ?Allergen Reactions  ? Almond Oil Swelling  ? Apple Juice Swelling  ? Kiwi Extract Swelling  ? Soy Allergy Itching  ?  Mouth/ tongue itch  ? Strawberry Extract Itching  ?  Mouth/ tongue itch  ? ? ?ROS ? ?  See HPI ?Objective:  ?  ?Physical Exam ?Constitutional:   ?   General: She is not in acute distress. ?   Appearance: Normal appearance. She is well-developed.  ?HENT:  ?   Head: Normocephalic and atraumatic.  ?   Right Ear: External ear normal.  ?   Left Ear: External ear normal.  ?Eyes:  ?   General: No scleral icterus. ?Neck:  ?   Thyroid: No thyromegaly.  ?Cardiovascular:  ?   Rate and Rhythm: Normal rate and regular rhythm.  ?   Heart sounds: Normal heart sounds. No murmur heard. ?Pulmonary:  ?   Effort: Pulmonary effort is normal. No respiratory distress.  ?   Breath sounds: Normal breath sounds. No wheezing.  ?Musculoskeletal:  ?   Cervical back: Neck supple.  ?Skin: ?   General: Skin is warm and dry.  ?Neurological:  ?   Mental Status: She is alert and oriented to person, place, and time.  ?Psychiatric:     ?   Mood and Affect: Mood normal.     ?   Behavior: Behavior normal.     ?   Thought Content: Thought content normal.     ?   Judgment: Judgment normal.  ? ? ?BP (!) 110/40 (BP Location: Right Arm, Patient Position: Sitting, Cuff Size: Large)   Pulse 68   Temp 97.9 ?F (36.6 ?C) (Oral)   Resp 16   Wt 205 lb (93 kg)   SpO2 100%    BMI 35.19 kg/m?  ?Wt Readings from Last 3 Encounters:  ?02/18/22 205 lb (93 kg)  ?12/17/21 213 lb 11.2 oz (96.9 kg)  ?12/03/21 216 lb (98 kg)  ? ? ?   ?Assessment & Plan:  ? ?Problem List Items Addressed This Visit

## 2022-02-18 NOTE — Assessment & Plan Note (Signed)
Will see if we can get Wegovy covered for her through her insurance.  In the meantime, she will finish up the [facility] that she has on hand. Continue dietary efforts. Exercise is limited currently due to her recent foot surgery. ?

## 2022-02-19 ENCOUNTER — Ambulatory Visit (INDEPENDENT_AMBULATORY_CARE_PROVIDER_SITE_OTHER): Payer: No Typology Code available for payment source | Admitting: Physical Therapy

## 2022-02-19 ENCOUNTER — Encounter: Payer: Self-pay | Admitting: Physical Therapy

## 2022-02-19 ENCOUNTER — Other Ambulatory Visit (HOSPITAL_BASED_OUTPATIENT_CLINIC_OR_DEPARTMENT_OTHER): Payer: Self-pay

## 2022-02-19 DIAGNOSIS — R2689 Other abnormalities of gait and mobility: Secondary | ICD-10-CM

## 2022-02-19 DIAGNOSIS — M25572 Pain in left ankle and joints of left foot: Secondary | ICD-10-CM | POA: Diagnosis not present

## 2022-02-19 LAB — IRON AND TIBC
Iron Saturation: 18 % (ref 15–55)
Iron: 81 ug/dL (ref 27–159)
Total Iron Binding Capacity: 458 ug/dL — ABNORMAL HIGH (ref 250–450)
UIBC: 377 ug/dL (ref 131–425)

## 2022-02-19 NOTE — Therapy (Signed)
OUTPATIENT PHYSICAL THERAPY LOWER EXTREMITY TREATMENT  Patient Name: Emily [name] MRN: [id number] DOB:[birth year], 46 y.o., female Today's Date: 02/19/2022   PT End of Session - 02/19/22 1706     Visit Number 5    Number of Visits 12    Date for PT Re-Evaluation 03/13/22    Authorization Type [facility]    PT Start Time 1518    PT Stop Time 7169    PT Time Calculation (min) 40 min    Activity Tolerance Patient tolerated treatment well    Behavior During Therapy [facility] for tasks assessed/performed                Past Medical History:  Diagnosis Date   B12 deficiency    Depression    History of COVID-19 2020   per pt mild symptoms that resolved   Hypermetropia of both eyes 09/12/2014   IDA (iron deficiency anemia)    Impingement syndrome of right shoulder 05/21/2015   Lactose intolerance    per pt due to gastric bypass   Morbid obesity ([place]) 08/15/2017   Nevus of choroid of left eye 09/12/2014   being monitored   Palpitations    due ot SVT   Psoriasis    Seborrheic dermatitis 02/08/2015   Status post gastric bypass for obesity 12/08/2016   SVT (supraventricular tachycardia) [facility])    cardiologist--- dr Edyth Gunnels;    normal echo in epic 02-17-2021 and event monitor 02-04-2021  symptomatic SVT w/ 5 brief episodes the longest 14 beats   Vitamin D deficiency    Wears glasses    Past Surgical History:  Procedure Laterality Date   COLPOSCOPY  2008   ENDOMETRIAL BIOPSY  2017   negative per pt   LAPAROSCOPIC CHOLECYSTECTOMY  2006   MASS EXCISION Left 08/17/2017   Procedure: EXCISION SOFT TISSUE  MASS LEFT FLANK;  Surgeon: [name], MD;  Location: [facility];  Service: General;  Laterality: Left;   ROUX-EN-Y GASTRIC BYPASS  2007   TENDON REPAIR Left 12/17/2021   Procedure: TENDON REPAIR OF LEFT FOOT;  Surgeon: Trula Slade, DPM;  Location: [facility];  Service: Podiatry;  Laterality: Left;  GENERAL WITH BLOCK   Patient Active  Problem List   Diagnosis Date Noted   Tear of peroneal tendon 02/05/2022   Tear of peroneal tendon of left foot 12/03/2021   Benign paroxysmal positional vertigo of right ear 07/15/2021   Preventative health care 06/18/2021   Bronchitis 05/06/2021   Left foot pain 04/09/2021   Weight gain 04/09/2021   Supraventricular tachycardia ([place]) 03/12/2021   Dyspnea on exertion 01/10/2021   Lactose intolerance    Joint pain    Gallbladder problem    Palpitations    Back pain    Anemia    Allergy    Soft tissue mass 08/16/2017   Morbid obesity ([place]) 08/15/2017   Obesity (BMI 35.0-39.9 without comorbidity) 12/23/2016   Status post gastric bypass for obesity 12/08/2016   Vitamin B 12 deficiency 12/08/2016   Depression 12/08/2016   B12 deficiency 10/30/2016   Multiple food allergies 10/30/2016   Vitamin D deficiency 10/19/2016   AC (acromioclavicular) arthritis 05/21/2015   Acute pain of right shoulder 05/21/2015   Biceps tendonitis on right 05/21/2015   Impingement syndrome of right shoulder 05/21/2015   Seborrheic dermatitis 02/08/2015   Hypermetropia of both eyes 09/12/2014   Nevus of choroid of left eye 09/12/2014   Psoriasis 01/15/2014   Compound nevus 01/15/2014  PCP: [name]  REFERRING PROVIDER: Celesta Gentile  REFERRING DIAG: S/P L peroneal tendon repair 12/17/21  THERAPY DIAG:  Pain in left ankle and joints of left foot  Other abnormalities of gait and mobility  ONSET DATE: 12/17/21    SUBJECTIVE:   SUBJECTIVE STATEMENT: Pt walking today without crutches. Saw MD, ok to gradually come out of boot into shoe.   Eval:Pt had L peroneal tendon repair on 12/17/21. She has been in boot and NWB since surgery. She had f/u appt 4/25. States PWB at this time, will clarify other movement restrictions.  Reports increased pain in L ankle Last may, no injury , but was exercising. Has been in boot since Nov due to pain prior to surgery.  Works full time Scientist, forensic , does standing and sitting,  Still working from home a few days at this time post op.  Notes nerve pain on top of foot , just started gab pentin on Saturday for this.    PERTINENT HISTORY: Iron deficiency/anemia, SVT,  PAIN:  Are you having pain? Yes: NPRS scale: more nerve pain 5/10 Pain location: L foot/ankle Pain description: burning Aggravating factors: at end of day Relieving factors: elevation  PRECAUTIONS: None  WEIGHT BEARING RESTRICTIONS Yes PWB   FALLS:  Has patient fallen in last 6 months? No   PLOF: Independent  PATIENT GOALS return to PLOF, no pain in foot.    OBJECTIVE:    COGNITION:  Overall cognitive status: Within functional limits for tasks assessed     SENSATION: Mild hypersensitivity on dorsum of foot with light touch, states worse when foot swollen at end of day.   PALPATION: Healing incision at lateral L ankle.   LE ROM:   01/30/22: Hips: WFL, Knee: WFL, Ankle: not tested, until we find out ROM/activity limitations for PT. Is able to move toes well.   LE MMT:   01/30/22:  Hips: 4/5, Knees: 4+/5 , Ankle: not tested.      TODAY'S TREATMENT: 02/19/22:  Ther ex:   Supine: ankle AROM PF/DF  x 20; Toe flex/ext x 20;  EV YTB x 20;  Seated:   HR x 15;  Standing: weight shifts L/R , A/P , and staggered stance x 20 ea bil; Gastroc stretch at wall 30 sec x 5 on L;  Manual: PROM for all motions, A/P mobilization to inc DF;  Gait:  Shoes, no AD, 50 ft x 6, cuing for heel to toe gait.    PATIENT EDUCATION:  Education details:  reviewed HEP and current precautions.  Person educated: Patient Education method: Explanation, Demonstration, Tactile cues, Verbal cues, and Handouts Education comprehension: verbalized understanding, returned demonstration, verbal cues required, tactile cues required, and needs further education   HOME EXERCISE PROGRAM: Access Code: [email or web address]   ASSESSMENT:  CLINICAL IMPRESSION: Pt did very well with weight  bearing in shoes today. No increased pain with activities today. Discussed easing out of boot, and still wearing as needed, or if pain increases. Plan to progress weight bearing, gait, stairs, and strength as tolerated.   OBJECTIVE IMPAIRMENTS Abnormal gait, decreased activity tolerance, decreased balance, decreased endurance, decreased knowledge of use of DME, decreased mobility, difficulty walking, decreased ROM, decreased strength, increased muscle spasms, impaired flexibility, and pain.   ACTIVITY LIMITATIONS cleaning, community activity, driving, meal prep, occupation, laundry, medication management, yard work, and shopping.   PERSONAL FACTORS  none  are also affecting patient's functional outcome.    REHAB POTENTIAL: Good  CLINICAL DECISION MAKING: Stable/uncomplicated  EVALUATION COMPLEXITY: Low   GOALS: Goals reviewed with patient? Yes  SHORT TERM GOALS: Target date: 03/05/2022  Pt to be independent with initial HEP  Goal status: INITIAL  2.  Pt to demo ability for independent ambulation, with PWB status and crutches, for at least 100 ft,   Goal status: INITIAL  3.  Pt to be independent/safe for stair navigation with PWB status and use of crutches.   Goal status: INITIAL    LONG TERM GOALS: Target date: 04/16/2022  Pt to be independent with final HEP  Goal status: INITIAL  2.  Pt to demo full ROM of L ankle, to be WNL and pain free.   Goal status: INITIAL  3.  Pt to demo improved strength and NMC of L ankle, to be WNL with standing and functional activity.   Goal status: INITIAL  4.  Pt to demo ability for independent ambulation, with mechanics WNL,  for community distances, with pain 0-2/10, to return to community and exercise activities.   Goal status: INITIAL  5.  Pt to demo ability for SLS for at least 30 sec on L foot, to improve stability and NMC.   Goal status: INITIAL   PLAN: PT FREQUENCY: 1-2x/week  PT DURATION: 8 weeks  PLANNED  INTERVENTIONS: Therapeutic exercises, Therapeutic activity, Neuromuscular re-education, Balance training, Gait training, Patient/Family education, Joint manipulation, Joint mobilization, Stair training, Orthotic/Fit training, DME instructions, Dry Needling, Electrical stimulation, Spinal manipulation, Spinal mobilization, Cryotherapy, Moist heat, Taping, Vasopneumatic device, Traction, Ultrasound, Ionotophoresis '4mg'$ /ml Dexamethasone, and Manual therapy  PLAN FOR NEXT SESSION: progress new WB in shoes, gait, stairs, weight shifts, Ankle ROM/strength as tolerated./ no restrictions at this time.    Lyndee Hensen, PT, DPT 5:07 PM  02/19/22

## 2022-02-20 ENCOUNTER — Other Ambulatory Visit (HOSPITAL_BASED_OUTPATIENT_CLINIC_OR_DEPARTMENT_OTHER): Payer: Self-pay

## 2022-02-20 ENCOUNTER — Telehealth: Payer: Self-pay | Admitting: *Deleted

## 2022-02-20 NOTE — Telephone Encounter (Signed)
Message from plan: The request has been approved. The authorization is effective for a maximum of 7 fills from 02/20/2022 to 09/18/2022, as long as the member is enrolled in their current health plan. The request was approved as submitted. This request has been approved for 10m per 28 days.The following prior authorizations have also been entered effective 02/20/2022 through 09/18/2022: Wegovy 0.'5mg'$ /0.553m allowing 63m57mer 28 days with 7 fills; please reference authorization number [id number] '1mg'$ /0.5mL79mllowing 63mL 56m 28 days with 7 fills; please reference authorization number [id number]. Wegovy 1.'7mg'$ /0.75mL,5mowing 3mL pe95m8 days with 7 fills; please reference authorization number [id number]. [id number] 2.'4mg'$ /0.75mL, a61ming 3mL per 31mdays with 7 fills; please reference authorization number [id number]. A [email or web address] notification letter will follow with additional details.

## 2022-02-20 NOTE — Telephone Encounter (Signed)
Prior auth started via cover my meds.  Awaiting determination.  Key: [id number]

## 2022-02-23 ENCOUNTER — Other Ambulatory Visit (HOSPITAL_BASED_OUTPATIENT_CLINIC_OR_DEPARTMENT_OTHER): Payer: Self-pay

## 2022-02-24 ENCOUNTER — Ambulatory Visit (INDEPENDENT_AMBULATORY_CARE_PROVIDER_SITE_OTHER): Payer: No Typology Code available for payment source | Admitting: Physical Therapy

## 2022-02-24 ENCOUNTER — Encounter: Payer: Self-pay | Admitting: Physical Therapy

## 2022-02-24 DIAGNOSIS — R2689 Other abnormalities of gait and mobility: Secondary | ICD-10-CM | POA: Diagnosis not present

## 2022-02-24 DIAGNOSIS — M25572 Pain in left ankle and joints of left foot: Secondary | ICD-10-CM

## 2022-02-24 NOTE — Therapy (Signed)
OUTPATIENT PHYSICAL THERAPY LOWER EXTREMITY TREATMENT  Patient Name: Emily [name] MRN: [id number] DOB:05-30-1976, 46 y.o., female Today's Date: 02/24/2022   PT End of Session - 02/24/22 1500     Visit Number 6    Number of Visits 12    Date for PT Re-Evaluation 03/13/22    Authorization Type [facility]    PT Start Time 1505    PT Stop Time 2409    PT Time Calculation (min) 39 min    Activity Tolerance Patient tolerated treatment well    Behavior During Therapy WFL for tasks assessed/performed                Past Medical History:  Diagnosis Date   B12 deficiency    Depression    History of COVID-19 2020   per pt mild symptoms that resolved   Hypermetropia of both eyes 09/12/2014   IDA (iron deficiency anemia)    Impingement syndrome of right shoulder 05/21/2015   Lactose intolerance    per pt due to gastric bypass   Morbid obesity ([place]) 08/15/2017   Nevus of choroid of left eye 09/12/2014   being monitored   Palpitations    due ot SVT   Psoriasis    Seborrheic dermatitis 02/08/2015   Status post gastric bypass for obesity 12/08/2016   SVT (supraventricular tachycardia) [facility])    cardiologist--- dr Edyth Gunnels;    normal echo in epic 02-17-2021 and event monitor 02-04-2021  symptomatic SVT w/ 5 brief episodes the longest 14 beats   Vitamin D deficiency    Wears glasses    Past Surgical History:  Procedure Laterality Date   COLPOSCOPY  2008   ENDOMETRIAL BIOPSY  2017   negative per pt   LAPAROSCOPIC CHOLECYSTECTOMY  2006   MASS EXCISION Left 08/17/2017   Procedure: EXCISION SOFT TISSUE  MASS LEFT FLANK;  Surgeon: [name], MD;  Location: [place];  Service: General;  Laterality: Left;   ROUX-EN-Y GASTRIC BYPASS  2007   TENDON REPAIR Left 12/17/2021   Procedure: TENDON REPAIR OF LEFT FOOT;  Surgeon: Trula Slade, DPM;  Location: [place];  Service: Podiatry;  Laterality: Left;  GENERAL WITH BLOCK   Patient Active  Problem List   Diagnosis Date Noted   Tear of peroneal tendon 02/05/2022   Tear of peroneal tendon of left foot 12/03/2021   Benign paroxysmal positional vertigo of right ear 07/15/2021   Preventative health care 06/18/2021   Bronchitis 05/06/2021   Left foot pain 04/09/2021   Weight gain 04/09/2021   Supraventricular tachycardia ([place]) 03/12/2021   Dyspnea on exertion 01/10/2021   Lactose intolerance    Joint pain    Gallbladder problem    Palpitations    Back pain    Anemia    Allergy    Soft tissue mass 08/16/2017   Morbid obesity ([place]) 08/15/2017   Obesity (BMI 35.0-39.9 without comorbidity) 12/23/2016   Status post gastric bypass for obesity 12/08/2016   Vitamin B 12 deficiency 12/08/2016   Depression 12/08/2016   B12 deficiency 10/30/2016   Multiple food allergies 10/30/2016   Vitamin D deficiency 10/19/2016   AC (acromioclavicular) arthritis 05/21/2015   Acute pain of right shoulder 05/21/2015   Biceps tendonitis on right 05/21/2015   Impingement syndrome of right shoulder 05/21/2015   Seborrheic dermatitis 02/08/2015   Hypermetropia of both eyes 09/12/2014   Nevus of choroid of left eye 09/12/2014   Psoriasis 01/15/2014   Compound nevus 01/15/2014  PCP: [name]  REFERRING PROVIDER: Celesta Gentile  REFERRING DIAG: S/P L peroneal tendon repair 12/17/21  THERAPY DIAG:  Pain in left ankle and joints of left foot  Other abnormalities of gait and mobility  ONSET DATE: 12/17/21    SUBJECTIVE:   SUBJECTIVE STATEMENT: 02/24/2022 States that she has been wearing her shoe at work during her lunch hour. States she has not been wearing it at home as she doesn't like to wear shoes at home  Eval:Pt had L peroneal tendon repair on 12/17/21. She has been in boot and NWB since surgery. She had f/u appt 4/25. States PWB at this time, will clarify other movement restrictions.  Reports increased pain in L ankle Last may, no injury , but was exercising. Has been  in boot since Nov due to pain prior to surgery.  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  Notes nerve pain on top of foot , just started gab pentin on Saturday for this.    PERTINENT HISTORY: Iron deficiency/anemia, SVT,  PAIN:  Are you having pain? Yes: NPRS scale: more nerve pain 3/10 Pain location: L foot/ankle Pain description: soreness/tightness Aggravating factors: at end of day Relieving factors: elevation  PRECAUTIONS: None  WEIGHT BEARING RESTRICTIONS Yes PWB   FALLS:  Has patient fallen in last 6 months? No   PLOF: Independent  PATIENT GOALS return to PLOF, no pain in foot.    OBJECTIVE:    COGNITION:  Overall cognitive status: Within functional limits for tasks assessed     SENSATION: Mild hypersensitivity on dorsum of foot with light touch, states worse when foot swollen at end of day.   PALPATION: Healing incision at lateral L ankle.   LE ROM:   01/30/22: Hips: WFL, Knee: WFL, Ankle: not tested, until we find out ROM/activity limitations for PT. Is able to move toes well.   LE MMT:   01/30/22:  Hips: 4/5, Knees: 4+/5 , Ankle: not tested.      TODAY'S TREATMENT: 02/24/2022:  Ther ex:   Supine: ankle AROM PF/DF  x 20; Toe flex/ext x 20;  EV YTB x 20;  Seated:   HR x 20; DF x20 B, gentle Inv/Eversion B 1.5 minutes each bilateral  Standing: weight shifts L/R , A/P x10 10" holds Each on left, marching with R leg coming up and one finger assist 2x10, side stepping at counter x2 1 minutes, steps 4" up/down 3 minutes, 6"step just forwards up and backwards down alternating UE assist as needed 3 minutes Gait:  Shoes, no AD, 75 ft x 6, cuing for heel to toe gait.    PATIENT EDUCATION:  Education details:  reviewed HEP and discussed higher grade of compression for compression stockings and were to find them Person educated: Patient Education method: Explanation, Demonstration,  Tactile cues, Verbal cues, and Handouts Education comprehension: verbalized understanding, returned demonstration, verbal cues required, tactile cues required, and needs further education   HOME EXERCISE PROGRAM: Access Code: [email or web address]   ASSESSMENT:  CLINICAL IMPRESSION: Session focused on continued weightbearing progression. Tolerated all exercises well. Slight discomfort with stepping forward off of 4 inch step with 2 hand support but symptoms abolished immediately after movement. No pain noted end of session. Educated patient on elastic therapy outlet store for medical grade compression for swelling management in left leg. Will continue with current POC as tolerated.  OBJECTIVE IMPAIRMENTS Abnormal gait, decreased activity tolerance, decreased  balance, decreased endurance, decreased knowledge of use of DME, decreased mobility, difficulty walking, decreased ROM, decreased strength, increased muscle spasms, impaired flexibility, and pain.   ACTIVITY LIMITATIONS cleaning, community activity, driving, meal prep, occupation, laundry, medication management, yard work, and shopping.   PERSONAL FACTORS  none  are also affecting patient's functional outcome.    REHAB POTENTIAL: Good  CLINICAL DECISION MAKING: Stable/uncomplicated  EVALUATION COMPLEXITY: Low   GOALS: Goals reviewed with patient? Yes  SHORT TERM GOALS: Target date: 03/10/2022  Pt to be independent with initial HEP  Goal status: INITIAL  2.  Pt to demo ability for independent ambulation, with PWB status and crutches, for at least 100 ft,   Goal status: INITIAL  3.  Pt to be independent/safe for stair navigation with PWB status and use of crutches.   Goal status: INITIAL    LONG TERM GOALS: Target date: 04/21/2022  Pt to be independent with final HEP  Goal status: INITIAL  2.  Pt to demo full ROM of L ankle, to be WNL and pain free.   Goal status: INITIAL  3.  Pt to demo improved strength and NMC of L ankle,  to be WNL with standing and functional activity.   Goal status: INITIAL  4.  Pt to demo ability for independent ambulation, with mechanics WNL,  for community distances, with pain 0-2/10, to return to community and exercise activities.   Goal status: INITIAL  5.  Pt to demo ability for SLS for at least 30 sec on L foot, to improve stability and NMC.   Goal status: INITIAL   PLAN: PT FREQUENCY: 1-2x/week  PT DURATION: 8 weeks  PLANNED INTERVENTIONS: Therapeutic exercises, Therapeutic activity, Neuromuscular re-education, Balance training, Gait training, Patient/Family education, Joint manipulation, Joint mobilization, Stair training, Orthotic/Fit training, DME instructions, Dry Needling, Electrical stimulation, Spinal manipulation, Spinal mobilization, Cryotherapy, Moist heat, Taping, Vasopneumatic device, Traction, Ultrasound, Ionotophoresis '4mg'$ /ml Dexamethasone, and Manual therapy  PLAN FOR NEXT SESSION: progress new WB in shoes, gait, stairs, weight shifts, Ankle ROM/strength as tolerated./ no restrictions at this time.     3:47 PM, 02/24/22 Jerene Pitch, DPT Physical Therapy with [facility]

## 2022-02-25 ENCOUNTER — Telehealth: Payer: Self-pay | Admitting: Family

## 2022-02-25 ENCOUNTER — Encounter: Payer: No Typology Code available for payment source | Admitting: Physical Therapy

## 2022-02-25 ENCOUNTER — Encounter: Payer: Self-pay | Admitting: Gastroenterology

## 2022-02-25 ENCOUNTER — Other Ambulatory Visit (HOSPITAL_BASED_OUTPATIENT_CLINIC_OR_DEPARTMENT_OTHER): Payer: Self-pay

## 2022-02-25 MED ORDER — WEGOVY 1 MG/0.5ML ~~LOC~~ SOAJ
1.0000 mg | SUBCUTANEOUS | 3 refills | Status: DC
  Filled 2022-02-25 – 2022-02-26 (×3): qty 2, 28d supply, fill #0

## 2022-02-25 NOTE — Telephone Encounter (Signed)
Lower doses of Wegovy are unavailable. Since she is on Mounjaro already- will transition to the '1mg'$  Wegovy dose which is available.

## 2022-02-26 ENCOUNTER — Encounter: Payer: Self-pay | Admitting: Physical Therapy

## 2022-02-26 ENCOUNTER — Other Ambulatory Visit (HOSPITAL_BASED_OUTPATIENT_CLINIC_OR_DEPARTMENT_OTHER): Payer: Self-pay

## 2022-02-26 ENCOUNTER — Ambulatory Visit (INDEPENDENT_AMBULATORY_CARE_PROVIDER_SITE_OTHER): Payer: No Typology Code available for payment source | Admitting: Physical Therapy

## 2022-02-26 DIAGNOSIS — M25572 Pain in left ankle and joints of left foot: Secondary | ICD-10-CM

## 2022-02-26 DIAGNOSIS — R2689 Other abnormalities of gait and mobility: Secondary | ICD-10-CM

## 2022-02-26 NOTE — Therapy (Signed)
OUTPATIENT PHYSICAL THERAPY LOWER EXTREMITY TREATMENT  Patient Name: Emily [name] MRN: [id number] DOB:02-Jun-1976, 46 y.o., female Today's Date: 02/26/2022   PT End of Session - 02/26/22 1345     Visit Number 7    Number of Visits 12    Date for PT Re-Evaluation 03/13/22    Authorization Type [facility]    PT Start Time 1346    PT Stop Time 9211    PT Time Calculation (min) 39 min    Activity Tolerance Patient tolerated treatment well    Behavior During Therapy WFL for tasks assessed/performed                Past Medical History:  Diagnosis Date   B12 deficiency    Depression    History of COVID-19 2020   per pt mild symptoms that resolved   Hypermetropia of both eyes 09/12/2014   IDA (iron deficiency anemia)    Impingement syndrome of right shoulder 05/21/2015   Lactose intolerance    per pt due to gastric bypass   Morbid obesity ([place]) 08/15/2017   Nevus of choroid of left eye 09/12/2014   being monitored   Palpitations    due ot SVT   Psoriasis    Seborrheic dermatitis 02/08/2015   Status post gastric bypass for obesity 12/08/2016   SVT (supraventricular tachycardia) [facility])    cardiologist--- dr Edyth Gunnels;    normal echo in epic 02-17-2021 and event monitor 02-04-2021  symptomatic SVT w/ 5 brief episodes the longest 14 beats   Vitamin D deficiency    Wears glasses    Past Surgical History:  Procedure Laterality Date   COLPOSCOPY  2008   ENDOMETRIAL BIOPSY  2017   negative per pt   LAPAROSCOPIC CHOLECYSTECTOMY  2006   MASS EXCISION Left 08/17/2017   Procedure: EXCISION SOFT TISSUE  MASS LEFT FLANK;  Surgeon: [name], MD;  Location: [place];  Service: General;  Laterality: Left;   ROUX-EN-Y GASTRIC BYPASS  2007   TENDON REPAIR Left 12/17/2021   Procedure: TENDON REPAIR OF LEFT FOOT;  Surgeon: Trula Slade, DPM;  Location: [place];  Service: Podiatry;  Laterality: Left;  GENERAL WITH BLOCK   Patient Active  Problem List   Diagnosis Date Noted   Tear of peroneal tendon 02/05/2022   Tear of peroneal tendon of left foot 12/03/2021   Benign paroxysmal positional vertigo of right ear 07/15/2021   Preventative health care 06/18/2021   Bronchitis 05/06/2021   Left foot pain 04/09/2021   Weight gain 04/09/2021   Supraventricular tachycardia ([place]) 03/12/2021   Dyspnea on exertion 01/10/2021   Lactose intolerance    Joint pain    Gallbladder problem    Palpitations    Back pain    Anemia    Allergy    Soft tissue mass 08/16/2017   Morbid obesity ([place]) 08/15/2017   Obesity (BMI 35.0-39.9 without comorbidity) 12/23/2016   Status post gastric bypass for obesity 12/08/2016   Vitamin B 12 deficiency 12/08/2016   Depression 12/08/2016   B12 deficiency 10/30/2016   Multiple food allergies 10/30/2016   Vitamin D deficiency 10/19/2016   AC (acromioclavicular) arthritis 05/21/2015   Acute pain of right shoulder 05/21/2015   Biceps tendonitis on right 05/21/2015   Impingement syndrome of right shoulder 05/21/2015   Seborrheic dermatitis 02/08/2015   Hypermetropia of both eyes 09/12/2014   Nevus of choroid of left eye 09/12/2014   Psoriasis 01/15/2014   Compound nevus 01/15/2014  PCP: [name]  REFERRING PROVIDER: Celesta Gentile  REFERRING DIAG: S/P L peroneal tendon repair 12/17/21  THERAPY DIAG:  Pain in left ankle and joints of left foot  Other abnormalities of gait and mobility  ONSET DATE: 12/17/21    SUBJECTIVE:   SUBJECTIVE STATEMENT: 02/26/2022 States that on Tuesday night when walking from the couch without shoes on she had heel pain that was intense. The next day she continued to have heel pain with and without hte boot on. Similar to her heel pain she was just having in the boot but now all the time. It gets better with walking and is better today. States compression helps has not gotten her other compression garments yet  Eval:Pt had L peroneal tendon repair on  12/17/21. She has been in boot and NWB since surgery. She had f/u appt 4/25. States PWB at this time, will clarify other movement restrictions.  Reports increased pain in L ankle Last may, no injury , but was exercising. Has been in boot since Nov due to pain prior to surgery.  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  Notes nerve pain on top of foot , just started gab pentin on Saturday for this.    PERTINENT HISTORY: Iron deficiency/anemia, SVT,  PAIN:  Are you having pain? Yes: NPRS scale:  1/10 Pain location: L heel  Pain description: sharp shooting Aggravating factors: at end of day Relieving factors: elevation  PRECAUTIONS: None  WEIGHT BEARING RESTRICTIONS Yes PWB   FALLS:  Has patient fallen in last 6 months? No   PLOF: Independent  PATIENT GOALS return to PLOF, no pain in foot.    OBJECTIVE:    COGNITION:  Overall cognitive status: Within functional limits for tasks assessed     SENSATION: Mild hypersensitivity on dorsum of foot with light touch, states worse when foot swollen at end of day.   PALPATION: Healing incision at lateral L ankle.   LE ROM:   01/30/22: Hips: WFL, Knee: WFL, Ankle: not tested, until we find out ROM/activity limitations for PT. Is able to move toes well.   LE MMT:   01/30/22:  Hips: 4/5, Knees: 4+/5 , Ankle: not tested.      TODAY'S TREATMENT: 02/26/2022:  Ther ex:   Prone: ankle AROM PF/DF  x 20; Toe flex/ext x 20; PROM left great toe extension with DF Seated:   HR x 20; DF x20 B, ankle/toe/foot ARROM all directions as tolerated   Gait:  Shoes, no AD, 75 ft x 6, cuing for heel to toe gait.  Manual: STM to L achilles, plantar fascia, percussion gun for vibration at arch and calf - tolerated well  PATIENT EDUCATION:  Education details:  reviewed HEP and discussed higher grade of compression for compression stockings and were to find them Person  educated: Patient Education method: Explanation, Demonstration, Tactile cues, Verbal cues, and Handouts Education comprehension: verbalized understanding, returned demonstration, verbal cues required, tactile cues required, and needs further education   HOME EXERCISE PROGRAM: Access Code: [email or web address]   ASSESSMENT:  CLINICAL IMPRESSION: Tolerated session well with increase in heel pain compared to last session at start of session. Symptoms consistent with plantar fascia pain. Tolerated manual and vibration interventions well with no pain noted end of session. Advised mobility of foot and ankle prior to walking. Patient with  percussion gun at home advised on how to safely use gun on foot.  Advised patient to get therapeutic compression stockings as current stockings do not offer enough pressure to combat level of swelling. Will follow up with heel pain next session.   OBJECTIVE IMPAIRMENTS Abnormal gait, decreased activity tolerance, decreased balance, decreased endurance, decreased knowledge of use of DME, decreased mobility, difficulty walking, decreased ROM, decreased strength, increased muscle spasms, impaired flexibility, and pain.   ACTIVITY LIMITATIONS cleaning, community activity, driving, meal prep, occupation, laundry, medication management, yard work, and shopping.   PERSONAL FACTORS  none  are also affecting patient's functional outcome.    REHAB POTENTIAL: Good  CLINICAL DECISION MAKING: Stable/uncomplicated  EVALUATION COMPLEXITY: Low   GOALS: Goals reviewed with patient? Yes  SHORT TERM GOALS: Target date: 03/12/2022  Pt to be independent with initial HEP  Goal status: INITIAL  2.  Pt to demo ability for independent ambulation, with PWB status and crutches, for at least 100 ft,   Goal status: INITIAL  3.  Pt to be independent/safe for stair navigation with PWB status and use of crutches.   Goal status: INITIAL    LONG TERM GOALS: Target date: 04/23/2022  Pt to  be independent with final HEP  Goal status: INITIAL  2.  Pt to demo full ROM of L ankle, to be WNL and pain free.   Goal status: INITIAL  3.  Pt to demo improved strength and NMC of L ankle, to be WNL with standing and functional activity.   Goal status: INITIAL  4.  Pt to demo ability for independent ambulation, with mechanics WNL,  for community distances, with pain 0-2/10, to return to community and exercise activities.   Goal status: INITIAL  5.  Pt to demo ability for SLS for at least 30 sec on L foot, to improve stability and NMC.   Goal status: INITIAL   PLAN: PT FREQUENCY: 1-2x/week  PT DURATION: 8 weeks  PLANNED INTERVENTIONS: Therapeutic exercises, Therapeutic activity, Neuromuscular re-education, Balance training, Gait training, Patient/Family education, Joint manipulation, Joint mobilization, Stair training, Orthotic/Fit training, DME instructions, Dry Needling, Electrical stimulation, Spinal manipulation, Spinal mobilization, Cryotherapy, Moist heat, Taping, Vasopneumatic device, Traction, Ultrasound, Ionotophoresis '4mg'$ /ml Dexamethasone, and Manual therapy  PLAN FOR NEXT SESSION: progress new WB in shoes, gait, stairs, weight shifts, Ankle ROM/strength as tolerated./ no restrictions at this time.     2:25 PM, 02/26/22 Jerene Pitch, DPT Physical Therapy with [facility]

## 2022-02-27 ENCOUNTER — Encounter: Payer: Self-pay | Admitting: Podiatry

## 2022-03-03 ENCOUNTER — Ambulatory Visit (INDEPENDENT_AMBULATORY_CARE_PROVIDER_SITE_OTHER): Payer: No Typology Code available for payment source | Admitting: Physical Therapy

## 2022-03-03 ENCOUNTER — Encounter: Payer: Self-pay | Admitting: Physical Therapy

## 2022-03-03 ENCOUNTER — Other Ambulatory Visit: Payer: Self-pay | Admitting: Podiatry

## 2022-03-03 DIAGNOSIS — R2689 Other abnormalities of gait and mobility: Secondary | ICD-10-CM | POA: Diagnosis not present

## 2022-03-03 DIAGNOSIS — M25572 Pain in left ankle and joints of left foot: Secondary | ICD-10-CM

## 2022-03-03 DIAGNOSIS — M792 Neuralgia and neuritis, unspecified: Secondary | ICD-10-CM

## 2022-03-03 NOTE — Therapy (Signed)
OUTPATIENT PHYSICAL THERAPY LOWER EXTREMITY TREATMENT  Patient Name: Emily [name] MRN: [id number] DOB:[birth year], 46 y.o., female Today's Date: 03/03/2022   PT End of Session - 03/03/22 1429     Visit Number 8    Number of Visits 12    Date for PT Re-Evaluation 03/13/22    Authorization Type [facility]    PT Start Time 1432    PT Stop Time 1510    PT Time Calculation (min) 38 min    Activity Tolerance Patient tolerated treatment well    Behavior During Therapy [facility] for tasks assessed/performed                Past Medical History:  Diagnosis Date   B12 deficiency    Depression    History of COVID-19 2020   per pt mild symptoms that resolved   Hypermetropia of both eyes 09/12/2014   IDA (iron deficiency anemia)    Impingement syndrome of right shoulder 05/21/2015   Lactose intolerance    per pt due to gastric bypass   Morbid obesity ([place]) 08/15/2017   Nevus of choroid of left eye 09/12/2014   being monitored   Palpitations    due ot SVT   Psoriasis    Seborrheic dermatitis 02/08/2015   Status post gastric bypass for obesity 12/08/2016   SVT (supraventricular tachycardia) [facility])    cardiologist--- dr Edyth Gunnels;    normal echo in epic 02-17-2021 and event monitor 02-04-2021  symptomatic SVT w/ 5 brief episodes the longest 14 beats   Vitamin D deficiency    Wears glasses    Past Surgical History:  Procedure Laterality Date   COLPOSCOPY  2008   ENDOMETRIAL BIOPSY  2017   negative per pt   LAPAROSCOPIC CHOLECYSTECTOMY  2006   MASS EXCISION Left 08/17/2017   Procedure: EXCISION SOFT TISSUE  MASS LEFT FLANK;  Surgeon: [name], MD;  Location: [place];  Service: General;  Laterality: Left;   ROUX-EN-Y GASTRIC BYPASS  2007   TENDON REPAIR Left 12/17/2021   Procedure: TENDON REPAIR OF LEFT FOOT;  Surgeon: Trula Slade, DPM;  Location: [place];  Service: Podiatry;  Laterality: Left;  GENERAL WITH BLOCK   Patient Active  Problem List   Diagnosis Date Noted   Tear of peroneal tendon 02/05/2022   Tear of peroneal tendon of left foot 12/03/2021   Benign paroxysmal positional vertigo of right ear 07/15/2021   Preventative health care 06/18/2021   Bronchitis 05/06/2021   Left foot pain 04/09/2021   Weight gain 04/09/2021   Supraventricular tachycardia ([place]) 03/12/2021   Dyspnea on exertion 01/10/2021   Lactose intolerance    Joint pain    Gallbladder problem    Palpitations    Back pain    Anemia    Allergy    Soft tissue mass 08/16/2017   Morbid obesity ([place]) 08/15/2017   Obesity (BMI 35.0-39.9 without comorbidity) 12/23/2016   Status post gastric bypass for obesity 12/08/2016   Vitamin B 12 deficiency 12/08/2016   Depression 12/08/2016   B12 deficiency 10/30/2016   Multiple food allergies 10/30/2016   Vitamin D deficiency 10/19/2016   AC (acromioclavicular) arthritis 05/21/2015   Acute pain of right shoulder 05/21/2015   Biceps tendonitis on right 05/21/2015   Impingement syndrome of right shoulder 05/21/2015   Seborrheic dermatitis 02/08/2015   Hypermetropia of both eyes 09/12/2014   Nevus of choroid of left eye 09/12/2014   Psoriasis 01/15/2014   Compound nevus 01/15/2014  PCP: [name]  REFERRING PROVIDER: Celesta Gentile  REFERRING DIAG: S/P L peroneal tendon repair 12/17/21  THERAPY DIAG:  Pain in left ankle and joints of left foot  Other abnormalities of gait and mobility  ONSET DATE: 12/17/21    SUBJECTIVE:   SUBJECTIVE STATEMENT: 03/03/2022 States she eft here feeling good and that lasted 2-3 hours and stayed for 2-3 hrs. States that she wore boot all day. States that she as been trying new compression stockings. States that she has been wearing her shoe all day and she feels better with the shoe instead of the boot so she has been wearing her shoe a bit more and arrives with her shoes on and ace bandage on. Reports she has been going up and down stairs without  increase in pain with shoe on at home  Eval:Pt had L peroneal tendon repair on 12/17/21. She has been in boot and NWB since surgery. She had f/u appt 4/25. States PWB at this time, will clarify other movement restrictions.  Reports increased pain in L ankle Last may, no injury , but was exercising. Has been in boot since Nov due to pain prior to surgery.  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  Notes nerve pain on top of foot , just started gab pentin on Saturday for this.    PERTINENT HISTORY: Iron deficiency/anemia, SVT,  PAIN:  Are you having pain? Yes: NPRS scale: 8/10 Pain location: L heel  Pain description: sharp shooting Aggravating factors: at end of day Relieving factors: elevation  PRECAUTIONS: None  WEIGHT BEARING RESTRICTIONS Yes PWB   FALLS:  Has patient fallen in last 6 months? No   PLOF: Independent  PATIENT GOALS return to PLOF, no pain in foot.    OBJECTIVE:    COGNITION:  Overall cognitive status: Within functional limits for tasks assessed     SENSATION: Mild hypersensitivity on dorsum of foot with light touch, states worse when foot swollen at end of day.   PALPATION: Healing incision at lateral L ankle.   LE ROM:   01/30/22: Hips: WFL, Knee: WFL, Ankle: not tested, until we find out ROM/activity limitations for PT. Is able to move toes well.   LE MMT:   01/30/22:  Hips: 4/5, Knees: 4+/5 , Ankle: not tested.      TODAY'S TREATMENT: 03/03/2022:  Ther ex:   Prone: ankle AROM PF/DF  x 20; Toe flex/ext x 20; PROM left great toe extension with DF Seated:   HR x 20; DF x20 B, ankle/toe/foot ARROM all directions as tolerated, toe splaying/toe mobility x20     Manual: STM to L achilles, ant tuve plantar fascia, percussion gun for vibration at arch and calf - tolerated well  PATIENT EDUCATION:  Education details:  on DN, on ankle/foot mobility, on trailing heel  lift/arch support, on inconsistent compression with ace bandage, on elevation Person educated: Patient Education method: Explanation, Demonstration, Tactile cues, Verbal cues, and Handouts Education comprehension: verbalized understanding, returned demonstration, verbal cues required, tactile cues required, and needs further education   HOME EXERCISE PROGRAM: Access Code: [email or web address]   ASSESSMENT:  CLINICAL IMPRESSION: Patient with increased swelling on this date, likely due to change to ace bandage. Educated patient on use of compression socks and she is awaiting medical grade compression stockings to arrive (in mail). Educated patient on elevation, trailing arch support as she already has some and bringing in  to next session. Educated on DN as this may be good to try in future sessions. Reduced pain but continued pain noted end of session. No change in symptoms with heel lift. To note, incision looked irritated on this date, discussed trying to get swelling manage as this is likely causing irritation with her foot in the boot.  OBJECTIVE IMPAIRMENTS Abnormal gait, decreased activity tolerance, decreased balance, decreased endurance, decreased knowledge of use of DME, decreased mobility, difficulty walking, decreased ROM, decreased strength, increased muscle spasms, impaired flexibility, and pain.   ACTIVITY LIMITATIONS cleaning, community activity, driving, meal prep, occupation, laundry, medication management, yard work, and shopping.   PERSONAL FACTORS  none  are also affecting patient's functional outcome.    REHAB POTENTIAL: Good  CLINICAL DECISION MAKING: Stable/uncomplicated  EVALUATION COMPLEXITY: Low   GOALS: Goals reviewed with patient? Yes  SHORT TERM GOALS: Target date: 03/17/2022  Pt to be independent with initial HEP  Goal status: INITIAL  2.  Pt to demo ability for independent ambulation, with PWB status and crutches, for at least 100 ft,   Goal status:  INITIAL  3.  Pt to be independent/safe for stair navigation with PWB status and use of crutches.   Goal status: INITIAL    LONG TERM GOALS: Target date: 04/28/2022  Pt to be independent with final HEP  Goal status: INITIAL  2.  Pt to demo full ROM of L ankle, to be WNL and pain free.   Goal status: INITIAL  3.  Pt to demo improved strength and NMC of L ankle, to be WNL with standing and functional activity.   Goal status: INITIAL  4.  Pt to demo ability for independent ambulation, with mechanics WNL,  for community distances, with pain 0-2/10, to return to community and exercise activities.   Goal status: INITIAL  5.  Pt to demo ability for SLS for at least 30 sec on L foot, to improve stability and NMC.   Goal status: INITIAL   PLAN: PT FREQUENCY: 1-2x/week  PT DURATION: 8 weeks  PLANNED INTERVENTIONS: Therapeutic exercises, Therapeutic activity, Neuromuscular re-education, Balance training, Gait training, Patient/Family education, Joint manipulation, Joint mobilization, Stair training, Orthotic/Fit training, DME instructions, Dry Needling, Electrical stimulation, Spinal manipulation, Spinal mobilization, Cryotherapy, Moist heat, Taping, Vasopneumatic device, Traction, Ultrasound, Ionotophoresis '4mg'$ /ml Dexamethasone, and Manual therapy  PLAN FOR NEXT SESSION: DN?, arch supports?  F/u with compression/swelling/heel pain   2:29 PM, 03/03/22 Jerene Pitch, DPT Physical Therapy with [facility]

## 2022-03-04 ENCOUNTER — Encounter: Payer: No Typology Code available for payment source | Admitting: Physical Therapy

## 2022-03-04 ENCOUNTER — Other Ambulatory Visit (HOSPITAL_BASED_OUTPATIENT_CLINIC_OR_DEPARTMENT_OTHER): Payer: Self-pay

## 2022-03-06 ENCOUNTER — Encounter: Payer: Self-pay | Admitting: Physical Therapy

## 2022-03-06 ENCOUNTER — Ambulatory Visit (INDEPENDENT_AMBULATORY_CARE_PROVIDER_SITE_OTHER): Payer: No Typology Code available for payment source | Admitting: Physical Therapy

## 2022-03-06 DIAGNOSIS — R2689 Other abnormalities of gait and mobility: Secondary | ICD-10-CM

## 2022-03-06 DIAGNOSIS — M25572 Pain in left ankle and joints of left foot: Secondary | ICD-10-CM | POA: Diagnosis not present

## 2022-03-06 NOTE — Therapy (Signed)
OUTPATIENT PHYSICAL THERAPY LOWER EXTREMITY TREATMENT  Patient Name: Emily [name] MRN: [id number] DOB:March 25, 1976, 46 y.o., female Today's Date: 03/06/2022   PT End of Session - 03/06/22 1123     Visit Number 9    Number of Visits 12    Date for PT Re-Evaluation 03/13/22    Authorization Type [facility]    PT Start Time 0852    PT Stop Time 0932    PT Time Calculation (min) 40 min    Activity Tolerance Patient tolerated treatment well    Behavior During Therapy [facility] for tasks assessed/performed                Past Medical History:  Diagnosis Date   B12 deficiency    Depression    History of COVID-19 2020   per pt mild symptoms that resolved   Hypermetropia of both eyes 09/12/2014   IDA (iron deficiency anemia)    Impingement syndrome of right shoulder 05/21/2015   Lactose intolerance    per pt due to gastric bypass   Morbid obesity ([place]) 08/15/2017   Nevus of choroid of left eye 09/12/2014   being monitored   Palpitations    due ot SVT   Psoriasis    Seborrheic dermatitis 02/08/2015   Status post gastric bypass for obesity 12/08/2016   SVT (supraventricular tachycardia) [facility])    cardiologist--- dr Edyth Gunnels;    normal echo in epic 02-17-2021 and event monitor 02-04-2021  symptomatic SVT w/ 5 brief episodes the longest 14 beats   Vitamin D deficiency    Wears glasses    Past Surgical History:  Procedure Laterality Date   COLPOSCOPY  2008   ENDOMETRIAL BIOPSY  2017   negative per pt   LAPAROSCOPIC CHOLECYSTECTOMY  2006   MASS EXCISION Left 08/17/2017   Procedure: EXCISION SOFT TISSUE  MASS LEFT FLANK;  Surgeon: [name], MD;  Location: [place];  Service: General;  Laterality: Left;   ROUX-EN-Y GASTRIC BYPASS  2007   TENDON REPAIR Left 12/17/2021   Procedure: TENDON REPAIR OF LEFT FOOT;  Surgeon: Trula Slade, DPM;  Location: [place];  Service: Podiatry;  Laterality: Left;  GENERAL WITH BLOCK   Patient Active  Problem List   Diagnosis Date Noted   Tear of peroneal tendon 02/05/2022   Tear of peroneal tendon of left foot 12/03/2021   Benign paroxysmal positional vertigo of right ear 07/15/2021   Preventative health care 06/18/2021   Bronchitis 05/06/2021   Left foot pain 04/09/2021   Weight gain 04/09/2021   Supraventricular tachycardia ([place]) 03/12/2021   Dyspnea on exertion 01/10/2021   Lactose intolerance    Joint pain    Gallbladder problem    Palpitations    Back pain    Anemia    Allergy    Soft tissue mass 08/16/2017   Morbid obesity ([place]) 08/15/2017   Obesity (BMI 35.0-39.9 without comorbidity) 12/23/2016   Status post gastric bypass for obesity 12/08/2016   Vitamin B 12 deficiency 12/08/2016   Depression 12/08/2016   B12 deficiency 10/30/2016   Multiple food allergies 10/30/2016   Vitamin D deficiency 10/19/2016   AC (acromioclavicular) arthritis 05/21/2015   Acute pain of right shoulder 05/21/2015   Biceps tendonitis on right 05/21/2015   Impingement syndrome of right shoulder 05/21/2015   Seborrheic dermatitis 02/08/2015   Hypermetropia of both eyes 09/12/2014   Nevus of choroid of left eye 09/12/2014   Psoriasis 01/15/2014   Compound nevus 01/15/2014  PCP: [name]  REFERRING PROVIDER: Celesta Gentile  REFERRING DIAG: S/P L peroneal tendon repair 12/17/21  THERAPY DIAG:  Pain in left ankle and joints of left foot  Other abnormalities of gait and mobility  ONSET DATE: 12/17/21    SUBJECTIVE:   SUBJECTIVE STATEMENT: 03/06/2022 States continued pain in Heel, did get new compression sock. Notes pain in shoe and in boot .   Eval:Pt had L peroneal tendon repair on 12/17/21. She has been in boot and NWB since surgery. She had f/u appt 4/25. States PWB at this time, will clarify other movement restrictions.  Reports increased pain in L ankle Last may, no injury , but was exercising. Has been in boot since Nov due to pain prior to surgery.  Works full time  Therapist, art , does standing and sitting,  Still working from home a few days at this time post op.  Notes nerve pain on top of foot , just started gab pentin on Saturday for this.    PERTINENT HISTORY: Iron deficiency/anemia, SVT,  PAIN:  Are you having pain? Yes: NPRS scale: 8/10 Pain location: L heel  Pain description: sharp shooting Aggravating factors: at end of day Relieving factors: elevation  PRECAUTIONS: None  WEIGHT BEARING RESTRICTIONS Yes PWB   FALLS:  Has patient fallen in last 6 months? No   PLOF: Independent  PATIENT GOALS return to PLOF, no pain in foot.    OBJECTIVE:    COGNITION:  Overall cognitive status: Within functional limits for tasks assessed     SENSATION: Mild hypersensitivity on dorsum of foot with light touch, states worse when foot swollen at end of day.   PALPATION: Healing incision at lateral L ankle.   LE ROM:   01/30/22: Hips: WFL, Knee: WFL, Ankle: not tested, until we find out ROM/activity limitations for PT. Is able to move toes well.   LE MMT:   01/30/22:  Hips: 4/5, Knees: 4+/5 , Ankle: not tested.      TODAY'S TREATMENT: 03/06/2022:  Ther ex:   Prone: ankle AROM PF/DF  x 20; Toe flex/ext x 20;  Seated:   HR x 20;   Standing: HR x 20 slow eccentric lowering , Gastroc stretch at wall x 2 min;   Manual: STM/DTM to L medial ankle, medial heel/fat pad, and plantar fascia;   Ankle mobs to inc DF, ankle distraction;   K-tape: 2 I strips for fat pad/heel, and 1 I strip A to P under medial malleolus.   PATIENT EDUCATION:  Education details:  on continuing ice, compression sock, orthotic/shoe combination, and trial of oofos sandals. Education on use, wear, and precautions with k-tape.  Person educated: Patient Education method: Explanation, Demonstration, Tactile cues, Verbal cues, and Handouts Education comprehension: verbalized understanding, returned demonstration, verbal cues required, tactile cues required, and needs  further education   HOME EXERCISE PROGRAM: Access Code: [email or web address]   ASSESSMENT:  CLINICAL IMPRESSION: Patient with soreness at medial heel and medial ankle today. She did obtain new compression sock, recommended continued use of this as well as icing. Decreased swelling today from previous visit. Discussed optimal shoe and orthotic combination for her. She ordered recovery sandals, will trial these, and continue to use if she has decreased heel pain with this. Focus on manual today, and K-tape applied for pain relief. Pt with decreased pain 3/10 at end of session with ambulation. Plan to progress as tolerated.    OBJECTIVE IMPAIRMENTS Abnormal gait, decreased activity tolerance, decreased balance, decreased endurance, decreased knowledge  of use of DME, decreased mobility, difficulty walking, decreased ROM, decreased strength, increased muscle spasms, impaired flexibility, and pain.   ACTIVITY LIMITATIONS cleaning, community activity, driving, meal prep, occupation, laundry, medication management, yard work, and shopping.   PERSONAL FACTORS  none  are also affecting patient's functional outcome.    REHAB POTENTIAL: Good  CLINICAL DECISION MAKING: Stable/uncomplicated  EVALUATION COMPLEXITY: Low   GOALS: Goals reviewed with patient? Yes  SHORT TERM GOALS: Target date: 03/20/2022  Pt to be independent with initial HEP  Goal status: INITIAL  2.  Pt to demo ability for independent ambulation, with PWB status and crutches, for at least 100 ft,   Goal status: INITIAL  3.  Pt to be independent/safe for stair navigation with PWB status and use of crutches.   Goal status: INITIAL    LONG TERM GOALS: Target date: 05/01/2022  Pt to be independent with final HEP  Goal status: INITIAL  2.  Pt to demo full ROM of L ankle, to be WNL and pain free.   Goal status: INITIAL  3.  Pt to demo improved strength and NMC of L ankle, to be WNL with standing and functional activity.   Goal  status: INITIAL  4.  Pt to demo ability for independent ambulation, with mechanics WNL,  for community distances, with pain 0-2/10, to return to community and exercise activities.   Goal status: INITIAL  5.  Pt to demo ability for SLS for at least 30 sec on L foot, to improve stability and NMC.   Goal status: INITIAL   PLAN: PT FREQUENCY: 1-2x/week  PT DURATION: 8 weeks  PLANNED INTERVENTIONS: Therapeutic exercises, Therapeutic activity, Neuromuscular re-education, Balance training, Gait training, Patient/Family education, Joint manipulation, Joint mobilization, Stair training, Orthotic/Fit training, DME instructions, Dry Needling, Electrical stimulation, Spinal manipulation, Spinal mobilization, Cryotherapy, Moist heat, Taping, Vasopneumatic device, Traction, Ultrasound, Ionotophoresis '4mg'$ /ml Dexamethasone, and Manual therapy  PLAN FOR NEXT SESSION: DN?, F/u with compression/swelling/heel pain   Lyndee Hensen, PT, DPT 11:25 AM  03/06/22

## 2022-03-10 ENCOUNTER — Encounter: Payer: Self-pay | Admitting: Physical Therapy

## 2022-03-10 ENCOUNTER — Other Ambulatory Visit (HOSPITAL_BASED_OUTPATIENT_CLINIC_OR_DEPARTMENT_OTHER): Payer: Self-pay

## 2022-03-10 ENCOUNTER — Ambulatory Visit (INDEPENDENT_AMBULATORY_CARE_PROVIDER_SITE_OTHER): Payer: No Typology Code available for payment source | Admitting: Podiatry

## 2022-03-10 ENCOUNTER — Ambulatory Visit (INDEPENDENT_AMBULATORY_CARE_PROVIDER_SITE_OTHER): Payer: No Typology Code available for payment source | Admitting: Physical Therapy

## 2022-03-10 DIAGNOSIS — M722 Plantar fascial fibromatosis: Secondary | ICD-10-CM

## 2022-03-10 DIAGNOSIS — R2689 Other abnormalities of gait and mobility: Secondary | ICD-10-CM

## 2022-03-10 DIAGNOSIS — M7672 Peroneal tendinitis, left leg: Secondary | ICD-10-CM

## 2022-03-10 DIAGNOSIS — M25572 Pain in left ankle and joints of left foot: Secondary | ICD-10-CM | POA: Diagnosis not present

## 2022-03-10 MED ORDER — METHYLPREDNISOLONE 4 MG PO TBPK
ORAL_TABLET | ORAL | 0 refills | Status: DC
  Filled 2022-03-10: qty 21, 6d supply, fill #0

## 2022-03-10 NOTE — Therapy (Signed)
OUTPATIENT PHYSICAL THERAPY LOWER EXTREMITY TREATMENT  Patient Name: Emily [name] MRN: [id number] DOB:1976-06-12, 46 y.o., female Today's Date: 03/10/2022   PT End of Session - 03/10/22 1302     Visit Number 10    Number of Visits 12    Date for PT Re-Evaluation 03/13/22    Authorization Type [facility]    PT Start Time 1304    PT Stop Time 1342    PT Time Calculation (min) 38 min    Activity Tolerance Patient tolerated treatment well    Behavior During Therapy WFL for tasks assessed/performed                Past Medical History:  Diagnosis Date   B12 deficiency    Depression    History of COVID-19 2020   per pt mild symptoms that resolved   Hypermetropia of both eyes 09/12/2014   IDA (iron deficiency anemia)    Impingement syndrome of right shoulder 05/21/2015   Lactose intolerance    per pt due to gastric bypass   Morbid obesity ([place]) 08/15/2017   Nevus of choroid of left eye 09/12/2014   being monitored   Palpitations    due ot SVT   Psoriasis    Seborrheic dermatitis 02/08/2015   Status post gastric bypass for obesity 12/08/2016   SVT (supraventricular tachycardia) [facility])    cardiologist--- dr Edyth Gunnels;    normal echo in epic 02-17-2021 and event monitor 02-04-2021  symptomatic SVT w/ 5 brief episodes the longest 14 beats   Vitamin D deficiency    Wears glasses    Past Surgical History:  Procedure Laterality Date   COLPOSCOPY  2008   ENDOMETRIAL BIOPSY  2017   negative per pt   LAPAROSCOPIC CHOLECYSTECTOMY  2006   MASS EXCISION Left 08/17/2017   Procedure: EXCISION SOFT TISSUE  MASS LEFT FLANK;  Surgeon: [name], MD;  Location: [place];  Service: General;  Laterality: Left;   ROUX-EN-Y GASTRIC BYPASS  2007   TENDON REPAIR Left 12/17/2021   Procedure: TENDON REPAIR OF LEFT FOOT;  Surgeon: Trula Slade, DPM;  Location: [place];  Service: Podiatry;  Laterality: Left;  GENERAL WITH BLOCK   Patient Active  Problem List   Diagnosis Date Noted   Tear of peroneal tendon 02/05/2022   Tear of peroneal tendon of left foot 12/03/2021   Benign paroxysmal positional vertigo of right ear 07/15/2021   Preventative health care 06/18/2021   Bronchitis 05/06/2021   Left foot pain 04/09/2021   Weight gain 04/09/2021   Supraventricular tachycardia ([place]) 03/12/2021   Dyspnea on exertion 01/10/2021   Lactose intolerance    Joint pain    Gallbladder problem    Palpitations    Back pain    Anemia    Allergy    Soft tissue mass 08/16/2017   Morbid obesity ([place]) 08/15/2017   Obesity (BMI 35.0-39.9 without comorbidity) 12/23/2016   Status post gastric bypass for obesity 12/08/2016   Vitamin B 12 deficiency 12/08/2016   Depression 12/08/2016   B12 deficiency 10/30/2016   Multiple food allergies 10/30/2016   Vitamin D deficiency 10/19/2016   AC (acromioclavicular) arthritis 05/21/2015   Acute pain of right shoulder 05/21/2015   Biceps tendonitis on right 05/21/2015   Impingement syndrome of right shoulder 05/21/2015   Seborrheic dermatitis 02/08/2015   Hypermetropia of both eyes 09/12/2014   Nevus of choroid of left eye 09/12/2014   Psoriasis 01/15/2014   Compound nevus 01/15/2014  PCP: [name]  REFERRING PROVIDER: Celesta Gentile  REFERRING DIAG: S/P L peroneal tendon repair 12/17/21  THERAPY DIAG:  Pain in left ankle and joints of left foot  Other abnormalities of gait and mobility  ONSET DATE: 12/17/21    SUBJECTIVE:   SUBJECTIVE STATEMENT: 03/10/2022 No longer wearing the boot. States that she got that she started the prednisone pack today. States she is on a lot of pain meds right now. States she has been wearing a plantar fascia brace and is going to wear her a=other brace at work. States that she is not currently wearing compression.    Eval:Pt had L peroneal tendon repair on 12/17/21. She has been in boot and NWB since surgery. She had f/u appt 4/25. States PWB at this  time, will clarify other movement restrictions.  Reports increased pain in L ankle Last may, no injury , but was exercising. Has been in boot since Nov due to pain prior to surgery.  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  Notes nerve pain on top of foot , just started gab pentin on Saturday for this.    PERTINENT HISTORY: Iron deficiency/anemia, SVT,  PAIN:  Are you having pain? Yes: NPRS scale: 4/10 Pain location: L heel  Pain description: sharp shooting Aggravating factors: at end of day Relieving factors: elevation  PRECAUTIONS: None  WEIGHT BEARING RESTRICTIONS Yes PWB   FALLS:  Has patient fallen in last 6 months? No   PLOF: Independent  PATIENT GOALS return to PLOF, no pain in foot.    OBJECTIVE:    COGNITION:  Overall cognitive status: Within functional limits for tasks assessed     SENSATION: Mild hypersensitivity on dorsum of foot with light touch, states worse when foot swollen at end of day.   PALPATION: Healing incision at lateral L ankle.   LE ROM:   01/30/22: Hips: WFL, Knee: WFL, Ankle: not tested, until we find out ROM/activity limitations for PT. Is able to move toes well.   LE MMT:   01/30/22:  Hips: 4/5, Knees: 4+/5 , Ankle: not tested.      TODAY'S TREATMENT: 03/10/2022:  Ther ex:   Prone: ankle AROM PF/DF  x 20;   Seated:   HR x 20;  great toe extension 2x15 L Standing: vibration on foam pad 6 minutes, wt shifts on left 10" on blue foam 3 minutes total, Gastroc stretch at wall x 2 min; tibial translation stretch x10 L/R/C on left foot  Manual: STM/DTM to L medial ankle/achilles with and without metal tool, medial    Ankle mobs to inc DF, ankle distraction;, met and mid foot mobilizations in all directions grade II/III      PATIENT EDUCATION:  Education details:  on continuing with plan Person educated: Patient Education method: Explanation, Demonstration,  Tactile cues, Verbal cues, and Handouts Education comprehension: verbalized understanding, returned demonstration, verbal cues required, tactile cues required, and needs further education   HOME EXERCISE PROGRAM: Access Code: [email or web address]   ASSESSMENT:  CLINICAL IMPRESSION: Overall tolerance to interventions better from last week. Continued swelling but this is also improved. Focused on foot mobility, weight shifts and pain management strategies. Reduced pain after tibial translation stretch. Will continue with current POC as tolerated    OBJECTIVE IMPAIRMENTS Abnormal gait, decreased activity tolerance, decreased balance, decreased endurance, decreased knowledge of use of DME, decreased mobility, difficulty walking, decreased ROM, decreased strength, increased  muscle spasms, impaired flexibility, and pain.   ACTIVITY LIMITATIONS cleaning, community activity, driving, meal prep, occupation, laundry, medication management, yard work, and shopping.   PERSONAL FACTORS  none  are also affecting patient's functional outcome.    REHAB POTENTIAL: Good  CLINICAL DECISION MAKING: Stable/uncomplicated  EVALUATION COMPLEXITY: Low   GOALS: Goals reviewed with patient? Yes  SHORT TERM GOALS: Target date: 03/24/2022  Pt to be independent with initial HEP  Goal status: INITIAL  2.  Pt to demo ability for independent ambulation, with PWB status and crutches, for at least 100 ft,   Goal status: INITIAL  3.  Pt to be independent/safe for stair navigation with PWB status and use of crutches.   Goal status: INITIAL    LONG TERM GOALS: Target date: 05/05/2022  Pt to be independent with final HEP  Goal status: INITIAL  2.  Pt to demo full ROM of L ankle, to be WNL and pain free.   Goal status: INITIAL  3.  Pt to demo improved strength and NMC of L ankle, to be WNL with standing and functional activity.   Goal status: INITIAL  4.  Pt to demo ability for independent ambulation, with  mechanics WNL,  for community distances, with pain 0-2/10, to return to community and exercise activities.   Goal status: INITIAL  5.  Pt to demo ability for SLS for at least 30 sec on L foot, to improve stability and NMC.   Goal status: INITIAL   PLAN: PT FREQUENCY: 1-2x/week  PT DURATION: 8 weeks  PLANNED INTERVENTIONS: Therapeutic exercises, Therapeutic activity, Neuromuscular re-education, Balance training, Gait training, Patient/Family education, Joint manipulation, Joint mobilization, Stair training, Orthotic/Fit training, DME instructions, Dry Needling, Electrical stimulation, Spinal manipulation, Spinal mobilization, Cryotherapy, Moist heat, Taping, Vasopneumatic device, Traction, Ultrasound, Ionotophoresis 70m/ml Dexamethasone, and Manual therapy  PLAN FOR NEXT SESSION: DN?, F/u with compression/swelling/heel pain   1:45 PM, 03/10/22 MJerene Pitch DPT Physical Therapy with CRoyston Sinner

## 2022-03-12 ENCOUNTER — Ambulatory Visit (INDEPENDENT_AMBULATORY_CARE_PROVIDER_SITE_OTHER): Payer: No Typology Code available for payment source | Admitting: Physical Therapy

## 2022-03-12 DIAGNOSIS — R2689 Other abnormalities of gait and mobility: Secondary | ICD-10-CM | POA: Diagnosis not present

## 2022-03-12 DIAGNOSIS — M25572 Pain in left ankle and joints of left foot: Secondary | ICD-10-CM

## 2022-03-12 NOTE — Therapy (Signed)
OUTPATIENT PHYSICAL THERAPY LOWER EXTREMITY TREATMENT  Patient Name: Emily [name] MRN: [id number] DOB:[birth year], 46 y.o., female Today's Date: 03/12/2022   PT End of Session - 03/13/22 0937     Visit Number 11    Number of Visits 12    Date for PT Re-Evaluation 03/13/22    Authorization Type [facility]    PT Start Time 0850    PT Stop Time 0930    PT Time Calculation (min) 40 min    Activity Tolerance Patient tolerated treatment well    Behavior During Therapy [facility] for tasks assessed/performed                 Past Medical History:  Diagnosis Date   B12 deficiency    Depression    History of COVID-19 2020   per pt mild symptoms that resolved   Hypermetropia of both eyes 09/12/2014   IDA (iron deficiency anemia)    Impingement syndrome of right shoulder 05/21/2015   Lactose intolerance    per pt due to gastric bypass   Morbid obesity ([place]) 08/15/2017   Nevus of choroid of left eye 09/12/2014   being monitored   Palpitations    due ot SVT   Psoriasis    Seborrheic dermatitis 02/08/2015   Status post gastric bypass for obesity 12/08/2016   SVT (supraventricular tachycardia) [facility])    cardiologist--- dr Edyth Gunnels;    normal echo in epic 02-17-2021 and event monitor 02-04-2021  symptomatic SVT w/ 5 brief episodes the longest 14 beats   Vitamin D deficiency    Wears glasses    Past Surgical History:  Procedure Laterality Date   COLPOSCOPY  2008   ENDOMETRIAL BIOPSY  2017   negative per pt   LAPAROSCOPIC CHOLECYSTECTOMY  2006   MASS EXCISION Left 08/17/2017   Procedure: EXCISION SOFT TISSUE  MASS LEFT FLANK;  Surgeon: [name], MD;  Location: [place];  Service: General;  Laterality: Left;   ROUX-EN-Y GASTRIC BYPASS  2007   TENDON REPAIR Left 12/17/2021   Procedure: TENDON REPAIR OF LEFT FOOT;  Surgeon: Trula Slade, DPM;  Location: [place];  Service: Podiatry;  Laterality: Left;  GENERAL WITH BLOCK   Patient Active  Problem List   Diagnosis Date Noted   Tear of peroneal tendon 02/05/2022   Tear of peroneal tendon of left foot 12/03/2021   Benign paroxysmal positional vertigo of right ear 07/15/2021   Preventative health care 06/18/2021   Bronchitis 05/06/2021   Left foot pain 04/09/2021   Weight gain 04/09/2021   Supraventricular tachycardia ([place]) 03/12/2021   Dyspnea on exertion 01/10/2021   Lactose intolerance    Joint pain    Gallbladder problem    Palpitations    Back pain    Anemia    Allergy    Soft tissue mass 08/16/2017   Morbid obesity ([place]) 08/15/2017   Obesity (BMI 35.0-39.9 without comorbidity) 12/23/2016   Status post gastric bypass for obesity 12/08/2016   Vitamin B 12 deficiency 12/08/2016   Depression 12/08/2016   B12 deficiency 10/30/2016   Multiple food allergies 10/30/2016   Vitamin D deficiency 10/19/2016   AC (acromioclavicular) arthritis 05/21/2015   Acute pain of right shoulder 05/21/2015   Biceps tendonitis on right 05/21/2015   Impingement syndrome of right shoulder 05/21/2015   Seborrheic dermatitis 02/08/2015   Hypermetropia of both eyes 09/12/2014   Nevus of choroid of left eye 09/12/2014   Psoriasis 01/15/2014   Compound nevus 01/15/2014  PCP: [name]  REFERRING PROVIDER: Celesta Gentile  REFERRING DIAG: S/P L peroneal tendon repair 12/17/21  THERAPY DIAG:  Pain in left ankle and joints of left foot  Other abnormalities of gait and mobility  ONSET DATE: 12/17/21    SUBJECTIVE:   SUBJECTIVE STATEMENT: 03/12/2022 Pt started taking predisone. Still taking pain meds daily. Foot still quite painful. Did more standing for 3-4 hours for work this week, foot was sore by afternoon. Has been able to wear sneaker and orthotics.    Eval:Pt had L peroneal tendon repair on 12/17/21. She has been in boot and NWB since surgery. She had f/u appt 4/25. States PWB at this time, will clarify other movement restrictions.  Reports increased pain in L  ankle Last may, no injury , but was exercising. Has been in boot since Nov due to pain prior to surgery.  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  Notes nerve pain on top of foot , just started gab pentin on Saturday for this.    PERTINENT HISTORY: Iron deficiency/anemia, SVT,  PAIN:  Are you having pain? Yes: NPRS scale: 4/10 Pain location: L heel  Pain description: sharp shooting Aggravating factors: at end of day Relieving factors: elevation  PRECAUTIONS: None  WEIGHT BEARING RESTRICTIONS Yes PWB   FALLS:  Has patient fallen in last 6 months? No   PLOF: Independent  PATIENT GOALS return to PLOF, no pain in foot.    OBJECTIVE:    COGNITION:  Overall cognitive status: Within functional limits for tasks assessed     SENSATION: Mild hypersensitivity on dorsum of foot with light touch, states worse when foot swollen at end of day.   PALPATION: Healing incision at lateral L ankle.   LE ROM:   01/30/22: Hips: WFL, Knee: WFL, Ankle: not tested, until we find out ROM/activity limitations for PT. Is able to move toes well.   LE MMT:   01/30/22:  Hips: 4/5, Knees: 4+/5 , Ankle: not tested.      TODAY'S TREATMENT: 03/12/22 Ther ex:   Prone: ankle AROM PF/DF  x 20;   Seated:    Standing: Gastroc and soleus stretch at wall x 2 min; tibial translation stretch x10 on step; HR x 20; SLS 10 sec x 3; staggered stance weight shifts/pre-gait x 20 ;   Manual: STM/DTM to L medial ankle/post tib, pl fascia;  Ankle mobs to inc DF, ankle distraction;, met and mid foot mobilizations in all directions grade II/III     03/10/2022:  Ther ex:   Prone: ankle AROM PF/DF  x 20;   Supine; Ankle Inv/Ev rt x 15 ea;  Gr toe flex RTB x 15;  Seated:   HR x 20;  great toe extension 2x15 L Standing: vibration on foam pad 6 minutes, wt shifts on left 10" on blue foam 3 minutes total, Gastroc stretch at wall x 2 min;  tibial translation stretch x10 L/R/C on left foot  Manual: STM/DTM to L medial ankle/achilles with and without metal tool, medial    Ankle mobs to inc DF, ankle distraction;, met and mid foot mobilizations in all directions grade II/III      PATIENT EDUCATION:  Education details:  on continuing with plan Person educated: Patient Education method: Explanation, Demonstration, Tactile cues, Verbal cues, and Handouts Education comprehension: verbalized understanding, returned demonstration, verbal cues required, tactile cues required, and needs further education  HOME EXERCISE PROGRAM: Access Code: [email or web address]   ASSESSMENT:  CLINICAL IMPRESSION:   Pt with much improved ambulation pattern today from previous weeks. Focus on gait/ in shoes, with heel to to pattern today. Improved tolerance for full weight bearing with less pain. Continues to have soreness at medial ankle, post tib with palpation and manual. Will continue with current POC as tolerated    OBJECTIVE IMPAIRMENTS Abnormal gait, decreased activity tolerance, decreased balance, decreased endurance, decreased knowledge of use of DME, decreased mobility, difficulty walking, decreased ROM, decreased strength, increased muscle spasms, impaired flexibility, and pain.   ACTIVITY LIMITATIONS cleaning, community activity, driving, meal prep, occupation, laundry, medication management, yard work, and shopping.   PERSONAL FACTORS  none  are also affecting patient's functional outcome.    REHAB POTENTIAL: Good  CLINICAL DECISION MAKING: Stable/uncomplicated  EVALUATION COMPLEXITY: Low   GOALS: Goals reviewed with patient? Yes  SHORT TERM GOALS: Target date: 03/27/2022  Pt to be independent with initial HEP  Goal status: INITIAL  2.  Pt to demo ability for independent ambulation, with PWB status and crutches, for at least 100 ft,   Goal status: INITIAL  3.  Pt to be independent/safe for stair navigation with PWB status and use  of crutches.   Goal status: INITIAL    LONG TERM GOALS: Target date: 05/08/2022  Pt to be independent with final HEP  Goal status: INITIAL  2.  Pt to demo full ROM of L ankle, to be WNL and pain free.   Goal status: INITIAL  3.  Pt to demo improved strength and NMC of L ankle, to be WNL with standing and functional activity.   Goal status: INITIAL  4.  Pt to demo ability for independent ambulation, with mechanics WNL,  for community distances, with pain 0-2/10, to return to community and exercise activities.   Goal status: INITIAL  5.  Pt to demo ability for SLS for at least 30 sec on L foot, to improve stability and NMC.   Goal status: INITIAL   PLAN: PT FREQUENCY: 1-2x/week  PT DURATION: 8 weeks  PLANNED INTERVENTIONS: Therapeutic exercises, Therapeutic activity, Neuromuscular re-education, Balance training, Gait training, Patient/Family education, Joint manipulation, Joint mobilization, Stair training, Orthotic/Fit training, DME instructions, Dry Needling, Electrical stimulation, Spinal manipulation, Spinal mobilization, Cryotherapy, Moist heat, Taping, Vasopneumatic device, Traction, Ultrasound, Ionotophoresis 21m/ml Dexamethasone, and Manual therapy  PLAN FOR NEXT SESSION: DN?, F/u with compression/swelling/heel pain  LLyndee Hensen PT, DPT 9:55 AM  03/13/22

## 2022-03-13 ENCOUNTER — Encounter: Payer: Self-pay | Admitting: Physical Therapy

## 2022-03-16 ENCOUNTER — Encounter: Payer: No Typology Code available for payment source | Admitting: Physical Therapy

## 2022-03-17 ENCOUNTER — Encounter: Payer: Self-pay | Admitting: Physical Therapy

## 2022-03-17 ENCOUNTER — Ambulatory Visit (INDEPENDENT_AMBULATORY_CARE_PROVIDER_SITE_OTHER): Payer: No Typology Code available for payment source | Admitting: Physical Therapy

## 2022-03-17 DIAGNOSIS — R2689 Other abnormalities of gait and mobility: Secondary | ICD-10-CM

## 2022-03-17 DIAGNOSIS — M25572 Pain in left ankle and joints of left foot: Secondary | ICD-10-CM

## 2022-03-17 NOTE — Therapy (Signed)
OUTPATIENT PHYSICAL THERAPY LOWER EXTREMITY TREATMENT/ Re-cert  Patient Name: Emily [name] MRN: [id number] DOB:1976/09/12, 46 y.o., female Today's Date: 03/17/2022   PT End of Session - 03/17/22 0843     Visit Number 12    Number of Visits 24    Date for PT Re-Evaluation 04/28/22    Authorization Type [facility]    PT Start Time 0844    PT Stop Time 0922    PT Time Calculation (min) 38 min    Activity Tolerance Patient tolerated treatment well    Behavior During Therapy [facility] for tasks assessed/performed                 Past Medical History:  Diagnosis Date   B12 deficiency    Depression    History of COVID-19 2020   per pt mild symptoms that resolved   Hypermetropia of both eyes 09/12/2014   IDA (iron deficiency anemia)    Impingement syndrome of right shoulder 05/21/2015   Lactose intolerance    per pt due to gastric bypass   Morbid obesity ([place]) 08/15/2017   Nevus of choroid of left eye 09/12/2014   being monitored   Palpitations    due ot SVT   Psoriasis    Seborrheic dermatitis 02/08/2015   Status post gastric bypass for obesity 12/08/2016   SVT (supraventricular tachycardia) [facility])    cardiologist--- dr Edyth Gunnels;    normal echo in epic 02-17-2021 and event monitor 02-04-2021  symptomatic SVT w/ 5 brief episodes the longest 14 beats   Vitamin D deficiency    Wears glasses    Past Surgical History:  Procedure Laterality Date   COLPOSCOPY  2008   ENDOMETRIAL BIOPSY  2017   negative per pt   LAPAROSCOPIC CHOLECYSTECTOMY  2006   MASS EXCISION Left 08/17/2017   Procedure: EXCISION SOFT TISSUE  MASS LEFT FLANK;  Surgeon: [name], MD;  Location: [place];  Service: General;  Laterality: Left;   ROUX-EN-Y GASTRIC BYPASS  2007   TENDON REPAIR Left 12/17/2021   Procedure: TENDON REPAIR OF LEFT FOOT;  Surgeon: Trula Slade, DPM;  Location: [place];  Service: Podiatry;  Laterality: Left;  GENERAL WITH BLOCK    Patient Active Problem List   Diagnosis Date Noted   Tear of peroneal tendon 02/05/2022   Tear of peroneal tendon of left foot 12/03/2021   Benign paroxysmal positional vertigo of right ear 07/15/2021   Preventative health care 06/18/2021   Bronchitis 05/06/2021   Left foot pain 04/09/2021   Weight gain 04/09/2021   Supraventricular tachycardia ([place]) 03/12/2021   Dyspnea on exertion 01/10/2021   Lactose intolerance    Joint pain    Gallbladder problem    Palpitations    Back pain    Anemia    Allergy    Soft tissue mass 08/16/2017   Morbid obesity ([place]) 08/15/2017   Obesity (BMI 35.0-39.9 without comorbidity) 12/23/2016   Status post gastric bypass for obesity 12/08/2016   Vitamin B 12 deficiency 12/08/2016   Depression 12/08/2016   B12 deficiency 10/30/2016   Multiple food allergies 10/30/2016   Vitamin D deficiency 10/19/2016   AC (acromioclavicular) arthritis 05/21/2015   Acute pain of right shoulder 05/21/2015   Biceps tendonitis on right 05/21/2015   Impingement syndrome of right shoulder 05/21/2015   Seborrheic dermatitis 02/08/2015   Hypermetropia of both eyes 09/12/2014   Nevus of choroid of left eye 09/12/2014   Psoriasis 01/15/2014   Compound nevus 01/15/2014  PCP: [name]  REFERRING PROVIDER: Celesta Gentile  REFERRING DIAG: S/P L peroneal tendon repair 12/17/21  THERAPY DIAG:  Pain in left ankle and joints of left foot  Other abnormalities of gait and mobility  ONSET DATE: 12/17/21    SUBJECTIVE:   SUBJECTIVE STATEMENT: 03/17/2022 Pt finished prednisone today. Has had much improvement of heel pain.    Eval:Pt had L peroneal tendon repair on 12/17/21. She has been in boot and NWB since surgery. She had f/u appt 4/25. States PWB at this time, will clarify other movement restrictions.  Reports increased pain in L ankle Last may, no injury , but was exercising. Has been in boot since Nov due to pain prior to surgery.  Works full time  Therapist, art , does standing and sitting,  Still working from home a few days at this time post op.  Notes nerve pain on top of foot , just started gab pentin on Saturday for this.    PERTINENT HISTORY: Iron deficiency/anemia, SVT,  PAIN:  Are you having pain? Yes: NPRS scale: 4/10 Pain location: L heel  Pain description: sharp shooting Aggravating factors: at end of day Relieving factors: elevation  PRECAUTIONS: None  WEIGHT BEARING RESTRICTIONS Yes PWB   FALLS:  Has patient fallen in last 6 months? No   PLOF: Independent  PATIENT GOALS return to PLOF, no pain in foot.    OBJECTIVE: 03/17/22   COGNITION:  Overall cognitive status: Within functional limits for tasks assessed     SENSATION: Improved hypersensitivity   PALPATION: Well healed incision at lateral L ankle.   LE ROM:   03/17/22: Hips: WFL, Knee: WFL, Ankle: mild limitation for DF LE MMT:   03/17/22:  Hips: 4/5, Knees: 4+/5 , Ankle: inv: 4/5, Ev: 4-/5,  PF/DF 4/5      TODAY'S TREATMENT:  03/17/22  Ther ex:   Prone:  Seated:   Sit to stand x 10, seated HR x 15;  Standing: Gastroc  stretch at wall x 2 min; tibial translation stretch kneeling x 3 min;   HR x 20;  SLS 10 sec x 3; staggered stance weight shifts/pre-gait x 10 on L ; Step ups 6 in, no UE support x 15 on L;   Manual: Ankle mobs to inc DF, ankle distraction;, met and mid foot mobilizations in all directions grade II/III        PATIENT EDUCATION:  Education details:  reviewed HEP, reviewed POC.  Person educated: Patient Education method: Explanation, Demonstration, Tactile cues, Verbal cues, and Handouts Education comprehension: verbalized understanding, returned demonstration, verbal cues required, tactile cues required, and needs further education   HOME EXERCISE PROGRAM: Access Code: [email or web address]   ASSESSMENT:  CLINICAL IMPRESSION:   [birth year]: Re-cert:  Pt with much improved pain in heel and medial ankle today. She has  improving ROM and improving strength, and did well with ther ex today. She hs mild stiffness and limitation for DF, and continued to have weakness and instability in ankle. She has improved gait pattern today with and without shoes on, with no pain. She will benefit from continued care, with focus on strengthening/stability. Will continue pain relief as need. Discussed taking it easy in next few days, and not to over do activity, until we see full effects from prednisone /pain relief.    OBJECTIVE IMPAIRMENTS Abnormal gait, decreased activity tolerance, decreased balance, decreased endurance, decreased knowledge of use of DME, decreased mobility, difficulty walking, decreased ROM, decreased strength, increased muscle spasms, impaired flexibility, and pain.  ACTIVITY LIMITATIONS cleaning, community activity, driving, meal prep, occupation, laundry, medication management, yard work, and shopping.   PERSONAL FACTORS  none  are also affecting patient's functional outcome.    REHAB POTENTIAL: Good  CLINICAL DECISION MAKING: Stable/uncomplicated  EVALUATION COMPLEXITY: Low   GOALS: Goals reviewed with patient? Yes  SHORT TERM GOALS: Target date: 03/31/2022  Pt to be independent with initial HEP  Goal status: MET  2.  Pt to demo ability for independent ambulation, with PWB status and crutches, for at least 100 ft,   Goal status: MET  3.  Pt to be independent/safe for stair navigation with PWB status and use of crutches.   Goal status: MET    LONG TERM GOALS: Target date: 05/12/2022  Pt to be independent with final HEP  Goal status: IN PROGRESS  2.  Pt to demo full ROM of L ankle, to be WNL and pain free.   Goal status: IN PROGRESS  3.  Pt to demo improved strength and NMC of L ankle, to be WNL with standing and functional activity.   Goal status: IN PROGRESS  4.  Pt to demo ability for independent ambulation, with mechanics WNL,  for community distances, with pain 0-2/10, to  return to community and exercise activities.   Goal status: IN PROGRESS  5.  Pt to demo ability for SLS for at least 30 sec on L foot, to improve stability and NMC.   Goal status: IN PROGRESS   PLAN: PT FREQUENCY: 1-2x/week  PT DURATION: 6 weeks  PLANNED INTERVENTIONS: Therapeutic exercises, Therapeutic activity, Neuromuscular re-education, Balance training, Gait training, Patient/Family education, Joint manipulation, Joint mobilization, Stair training, Orthotic/Fit training, DME instructions, Dry Needling, Electrical stimulation, Spinal manipulation, Spinal mobilization, Cryotherapy, Moist heat, Taping, Vasopneumatic device, Traction, Ultrasound, Ionotophoresis 80m/ml Dexamethasone, and Manual therapy  PLAN FOR NEXT SESSION:    LLyndee Hensen PT, DPT 12:12 PM  03/17/22

## 2022-03-17 NOTE — Progress Notes (Signed)
Subjective: Emily [name] is a 46 y.o. is seen today in office s/p left peroneal tendon repair preformed on 12/17/2021.  She states he is still continue with physical therapy in the area of the surgery is been doing well and she is wearing a regular shoe.  Her main concern is pain in the bottom of her heel.  No recent injury or changes otherwise.  No fevers or chills.   Objective: General: No acute distress, AAOx3  DP/PT pulses palpable 2/4, CRT < 3 sec to all digits.  Protective sensation intact. Motor function intact.  left foot: Incision is well coapted without any evidence of dehiscence.  There is no significant pain along the course of the peroneal tendon or along the surgical site.  Trace edema there is no erythema or warmth.  The majority tenderness is localized on the plantar aspect the calcaneus on the insertion of plantar fascia.  There is no pain with lateral compression the calcaneus.  No area pinpoint tenderness. MMT 5/5 No pain with calf compression, swelling, warmth, erythema.   Assessment and Plan:  Status post left peroneal tendon repair; neuritis  -Treatment options discussed including all alternatives, risks, and complications -Prescribed a Medrol Dosepak.  Continue stretching, rehab and physical therapy.  Icing daily.  Continue with shoes with good arch support.  Consider steroid injection if symptoms continue.  X-ray next appointment if still having heel pain  Trula Slade DPM

## 2022-03-19 ENCOUNTER — Other Ambulatory Visit (HOSPITAL_BASED_OUTPATIENT_CLINIC_OR_DEPARTMENT_OTHER): Payer: Self-pay

## 2022-03-19 ENCOUNTER — Encounter: Payer: Self-pay | Admitting: Physical Therapy

## 2022-03-19 ENCOUNTER — Ambulatory Visit: Payer: No Typology Code available for payment source | Admitting: Physical Therapy

## 2022-03-19 ENCOUNTER — Ambulatory Visit (AMBULATORY_SURGERY_CENTER): Payer: Self-pay | Admitting: *Deleted

## 2022-03-19 VITALS — Ht 64.0 in | Wt 205.0 lb

## 2022-03-19 DIAGNOSIS — Z1211 Encounter for screening for malignant neoplasm of colon: Secondary | ICD-10-CM

## 2022-03-19 DIAGNOSIS — M25572 Pain in left ankle and joints of left foot: Secondary | ICD-10-CM

## 2022-03-19 DIAGNOSIS — R2689 Other abnormalities of gait and mobility: Secondary | ICD-10-CM

## 2022-03-19 MED ORDER — NA SULFATE-K SULFATE-MG SULF 17.5-3.13-1.6 GM/177ML PO SOLN
1.0000 | ORAL | 0 refills | Status: DC
  Filled 2022-03-19: qty 354, 1d supply, fill #0

## 2022-03-19 NOTE — Therapy (Signed)
OUTPATIENT PHYSICAL THERAPY LOWER EXTREMITY TREATMENT/ Re-cert  Patient Name: Emily [name] MRN: [id number] DOB:10/12/1975, 46 y.o., female Today's Date: 03/17/2022   PT End of Session - 03/19/22 1215     Visit Number 13    Number of Visits 24    Date for PT Re-Evaluation 04/28/22    Authorization Type [facility]    PT Start Time 1216    PT Stop Time 1255    PT Time Calculation (min) 39 min    Activity Tolerance Patient tolerated treatment well    Behavior During Therapy WFL for tasks assessed/performed                 Past Medical History:  Diagnosis Date   B12 deficiency    Depression    History of COVID-19 2020   per pt mild symptoms that resolved   Hypermetropia of both eyes 09/12/2014   IDA (iron deficiency anemia)    Impingement syndrome of right shoulder 05/21/2015   Lactose intolerance    per pt due to gastric bypass   Morbid obesity ([facility]) 08/15/2017   Nevus of choroid of left eye 09/12/2014   being monitored   Palpitations    due ot SVT   Psoriasis    Seborrheic dermatitis 02/08/2015   Status post gastric bypass for obesity 12/08/2016   SVT (supraventricular tachycardia) ([facility])    cardiologist--- dr krakowski;    normal echo in epic 02-17-2021 and event monitor 02-04-2021  symptomatic SVT w/ 5 brief episodes the longest 14 beats   Vitamin D deficiency    Wears glasses    Past Surgical History:  Procedure Laterality Date   COLPOSCOPY  2008   ENDOMETRIAL BIOPSY  2017   negative per pt   LAPAROSCOPIC CHOLECYSTECTOMY  2006   MASS EXCISION Left 08/17/2017   Procedure: EXCISION SOFT TISSUE  MASS LEFT FLANK;  Surgeon: Gerkin, Todd, MD;  Location: [facility];  Service: General;  Laterality: Left;   ROUX-EN-Y GASTRIC BYPASS  2007   TENDON REPAIR Left 12/17/2021   Procedure: TENDON REPAIR OF LEFT FOOT;  Surgeon: Wagoner, Matthew R, DPM;  Location: [facility];  Service: Podiatry;  Laterality: Left;  GENERAL WITH BLOCK    Patient Active Problem List   Diagnosis Date Noted   Tear of peroneal tendon 02/05/2022   Tear of peroneal tendon of left foot 12/03/2021   Benign paroxysmal positional vertigo of right ear 07/15/2021   Preventative health care 06/18/2021   Bronchitis 05/06/2021   Left foot pain 04/09/2021   Weight gain 04/09/2021   Supraventricular tachycardia ([facility]) 03/12/2021   Dyspnea on exertion 01/10/2021   Lactose intolerance    Joint pain    Gallbladder problem    Palpitations    Back pain    Anemia    Allergy    Soft tissue mass 08/16/2017   Morbid obesity ([facility]) 08/15/2017   Obesity (BMI 35.0-39.9 without comorbidity) 12/23/2016   Status post gastric bypass for obesity 12/08/2016   Vitamin B 12 deficiency 12/08/2016   Depression 12/08/2016   B12 deficiency 10/30/2016   Multiple food allergies 10/30/2016   Vitamin D deficiency 10/19/2016   AC (acromioclavicular) arthritis 05/21/2015   Acute pain of right shoulder 05/21/2015   Biceps tendonitis on right 05/21/2015   Impingement syndrome of right shoulder 05/21/2015   Seborrheic dermatitis 02/08/2015   Hypermetropia of both eyes 09/12/2014   Nevus of choroid of left eye 09/12/2014   Psoriasis 01/15/2014   Compound nevus 01/15/2014      PCP: Melissa O'Sullivan  REFERRING PROVIDER: Matthew Wagoner  REFERRING DIAG: S/P L peroneal tendon repair 12/17/21  THERAPY DIAG:  Pain in left ankle and joints of left foot  Other abnormalities of gait and mobility  ONSET DATE: 12/17/21    SUBJECTIVE:   SUBJECTIVE STATEMENT: 03/17/2022 State she doesn't have heel pain. States she was walking down the steps with 2 dogs on leash and she stepped on the heel on a hill outside and she had some pain but is better now with rest, tylenol and ice.    Eval:Pt had L peroneal tendon repair on 12/17/21. She has been in boot and NWB since surgery. She had f/u appt 4/25. States PWB at this time, will clarify other movement restrictions.  Reports  increased pain in L ankle Last may, no injury , but was exercising. Has been in boot since Nov due to pain prior to surgery.  Works full time Nursing supervisor , does standing and sitting,  Still working from home a few days at this time post op.  Notes nerve pain on top of foot , just started gab pentin on Saturday for this.    PERTINENT HISTORY: Iron deficiency/anemia, SVT,  PAIN:  Are you having pain? Yes: NPRS scale: 0/10 Pain location: L heel  Pain description: sharp shooting Aggravating factors: at end of day Relieving factors: elevation  PRECAUTIONS: None  WEIGHT BEARING RESTRICTIONS Yes PWB   FALLS:  Has patient fallen in last 6 months? No   PLOF: Independent  PATIENT GOALS return to PLOF, no pain in foot.    OBJECTIVE: 03/17/22   COGNITION:  Overall cognitive status: Within functional limits for tasks assessed     SENSATION: Improved hypersensitivity   PALPATION: Well healed incision at lateral L ankle.   LE ROM:   03/17/22: Hips: WFL, Knee: WFL, Ankle: mild limitation for DF LE MMT:   03/17/22:  Hips: 4/5, Knees: 4+/5 , Ankle: inv: 4/5, Ev: 4-/5,  PF/DF 4/5      TODAY'S TREATMENT:  03/17/22  Ther ex:   Prone:  Seated:    Standing: tandem floor x1 30" holds B, tandem with head turns 4x5 B slow and controlled, SLS 30"  x3 B, step up foam with focus on weight through foot x2 1.5 minutes, then step downs from foam x2 1.5 minutes, single ladies ankle pumps x3 30" alternating, DF at wall 2x15 5" holds B, toe walking with UE support at counter x10 laps  PATIENT EDUCATION:  Education details:  HEP  Person educated: Patient Education method: Explanation, Demonstration, Tactile cues, Verbal cues, and Handouts Education comprehension: verbalized understanding, returned demonstration, verbal cues required, tactile cues required, and needs further education   HOME EXERCISE PROGRAM: Access Code: [email or web address]  ASSESSMENT:  CLINICAL  IMPRESSION: 03/19/2022 Patient tolerated session well. Progressed exercises with cues for form but no pain in heel or ankle noted during or after session. Fatigue in muscles noted end of session. Added standing ankle pump, toe walk with support and DF to HEP. Will continue with current POC     OBJECTIVE IMPAIRMENTS Abnormal gait, decreased activity tolerance, decreased balance, decreased endurance, decreased knowledge of use of DME, decreased mobility, difficulty walking, decreased ROM, decreased strength, increased muscle spasms, impaired flexibility, and pain.   ACTIVITY LIMITATIONS cleaning, community activity, driving, meal prep, occupation, laundry, medication management, yard work, and shopping.   PERSONAL FACTORS  none  are also affecting patient's functional outcome.    REHAB POTENTIAL: Good  CLINICAL DECISION MAKING: Stable/uncomplicated    EVALUATION COMPLEXITY: Low   GOALS: Goals reviewed with patient? Yes  SHORT TERM GOALS: Target date: 04/02/2022  Pt to be independent with initial HEP  Goal status: MET  2.  Pt to demo ability for independent ambulation, with PWB status and crutches, for at least 100 ft,   Goal status: MET  3.  Pt to be independent/safe for stair navigation with PWB status and use of crutches.   Goal status: MET    LONG TERM GOALS: Target date: 05/14/2022  Pt to be independent with final HEP  Goal status: IN PROGRESS  2.  Pt to demo full ROM of L ankle, to be WNL and pain free.   Goal status: IN PROGRESS  3.  Pt to demo improved strength and NMC of L ankle, to be WNL with standing and functional activity.   Goal status: IN PROGRESS  4.  Pt to demo ability for independent ambulation, with mechanics WNL,  for community distances, with pain 0-2/10, to return to community and exercise activities.   Goal status: IN PROGRESS  5.  Pt to demo ability for SLS for at least 30 sec on L foot, to improve stability and NMC.   Goal status: IN  PROGRESS   PLAN: PT FREQUENCY: 1-2x/week  PT DURATION: 6 weeks  PLANNED INTERVENTIONS: Therapeutic exercises, Therapeutic activity, Neuromuscular re-education, Balance training, Gait training, Patient/Family education, Joint manipulation, Joint mobilization, Stair training, Orthotic/Fit training, DME instructions, Dry Needling, Electrical stimulation, Spinal manipulation, Spinal mobilization, Cryotherapy, Moist heat, Taping, Vasopneumatic device, Traction, Ultrasound, Ionotophoresis 4mg/ml Dexamethasone, and Manual therapy  PLAN FOR NEXT SESSION:   12:57 PM, 03/19/22 Michele Landry, DPT Physical Therapy with [facility]      

## 2022-03-19 NOTE — Progress Notes (Signed)
Patient is here in-person for PV. Patient denies any allergies to eggs. Patient is allergic to SOY. Patient denies any problems with anesthesia/sedation. Patient had post op vomiting after foot surgery. Patient is not on any oxygen at home. Patient is not taking  diet/weight loss medications-phentermine or blood thinners. Patient is aware of our care-partner policy. EMMI education assigned to the patient for the procedure, sent to [place].

## 2022-03-20 ENCOUNTER — Other Ambulatory Visit (HOSPITAL_BASED_OUTPATIENT_CLINIC_OR_DEPARTMENT_OTHER): Payer: Self-pay

## 2022-03-24 ENCOUNTER — Encounter: Payer: Self-pay | Admitting: Physical Therapy

## 2022-03-24 ENCOUNTER — Ambulatory Visit (INDEPENDENT_AMBULATORY_CARE_PROVIDER_SITE_OTHER): Payer: No Typology Code available for payment source | Admitting: Physical Therapy

## 2022-03-24 DIAGNOSIS — R2689 Other abnormalities of gait and mobility: Secondary | ICD-10-CM

## 2022-03-24 DIAGNOSIS — M25572 Pain in left ankle and joints of left foot: Secondary | ICD-10-CM

## 2022-03-24 NOTE — Therapy (Signed)
OUTPATIENT PHYSICAL THERAPY LOWER EXTREMITY TREATMENT  Patient Name: Emily [name] MRN: [id number] DOB:11/08/1975, 46 y.o., female Today's Date: 03/17/2022   PT End of Session - 03/24/22 1543     Visit Number 14    Number of Visits 24    Date for PT Re-Evaluation 04/28/22    Authorization Type [facility]    PT Start Time 1545    PT Stop Time 1625    PT Time Calculation (min) 40 min    Activity Tolerance Patient tolerated treatment well    Behavior During Therapy [facility] for tasks assessed/performed                 Past Medical History:  Diagnosis Date   B12 deficiency    Depression    Heart murmur    History of COVID-19 2020   per pt mild symptoms that resolved   Hypermetropia of both eyes 09/12/2014   IDA (iron deficiency anemia)    Impingement syndrome of right shoulder 05/21/2015   Lactose intolerance    per pt due to gastric bypass   Morbid obesity ([place]) 08/15/2017   Nevus of choroid of left eye 09/12/2014   being monitored   Palpitations    due ot SVT   Psoriasis    Seborrheic dermatitis 02/08/2015   Status post gastric bypass for obesity 12/08/2016   SVT (supraventricular tachycardia) [facility])    cardiologist--- dr Edyth Gunnels;    normal echo in epic 02-17-2021 and event monitor 02-04-2021  symptomatic SVT w/ 5 brief episodes the longest 14 beats   Vitamin D deficiency    Wears glasses    Past Surgical History:  Procedure Laterality Date   COLPOSCOPY  2008   ENDOMETRIAL BIOPSY  2017   negative per pt   LAPAROSCOPIC CHOLECYSTECTOMY  2006   MASS EXCISION Left 08/17/2017   Procedure: EXCISION SOFT TISSUE  MASS LEFT FLANK;  Surgeon: [name], MD;  Location: [place];  Service: General;  Laterality: Left;   ROUX-EN-Y GASTRIC BYPASS  2007   TENDON REPAIR Left 12/17/2021   Procedure: TENDON REPAIR OF LEFT FOOT;  Surgeon: Trula Slade, DPM;  Location: [place];  Service: Podiatry;  Laterality: Left;  GENERAL WITH BLOCK    Patient Active Problem List   Diagnosis Date Noted   Tear of peroneal tendon 02/05/2022   Tear of peroneal tendon of left foot 12/03/2021   Benign paroxysmal positional vertigo of right ear 07/15/2021   Preventative health care 06/18/2021   Bronchitis 05/06/2021   Left foot pain 04/09/2021   Weight gain 04/09/2021   Supraventricular tachycardia ([place]) 03/12/2021   Dyspnea on exertion 01/10/2021   Lactose intolerance    Joint pain    Gallbladder problem    Palpitations    Back pain    Anemia    Allergy    Soft tissue mass 08/16/2017   Morbid obesity ([place]) 08/15/2017   Obesity (BMI 35.0-39.9 without comorbidity) 12/23/2016   Status post gastric bypass for obesity 12/08/2016   Vitamin B 12 deficiency 12/08/2016   Depression 12/08/2016   B12 deficiency 10/30/2016   Multiple food allergies 10/30/2016   Vitamin D deficiency 10/19/2016   AC (acromioclavicular) arthritis 05/21/2015   Acute pain of right shoulder 05/21/2015   Biceps tendonitis on right 05/21/2015   Impingement syndrome of right shoulder 05/21/2015   Seborrheic dermatitis 02/08/2015   Hypermetropia of both eyes 09/12/2014   Nevus of choroid of left eye 09/12/2014   Psoriasis 01/15/2014  Compound nevus 01/15/2014    PCP: [name]  REFERRING PROVIDER: Celesta Gentile  REFERRING DIAG: S/P L peroneal tendon repair 12/17/21  THERAPY DIAG:  Pain in left ankle and joints of left foot  Other abnormalities of gait and mobility  ONSET DATE: 12/17/21    SUBJECTIVE:   SUBJECTIVE STATEMENT: 03/24/2022  Feeling still some tightness but no worse than usual. States she hasn't taken her shoes off today   Eval:Pt had L peroneal tendon repair on 12/17/21. She has been in boot and NWB since surgery. She had f/u appt 4/25. States PWB at this time, will clarify other movement restrictions.  Reports increased pain in L ankle Last may, no injury , but was exercising. Has been in boot since Nov due to pain prior to  surgery.  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  Notes nerve pain on top of foot , just started gab pentin on Saturday for this.    PERTINENT HISTORY: Iron deficiency/anemia, SVT,  PAIN:  Are you having pain? Yes: NPRS scale: 0/10 Pain location: L heel  Pain description: sharp shooting Aggravating factors: at end of day Relieving factors: elevation  PRECAUTIONS: None  WEIGHT BEARING RESTRICTIONS Yes PWB   FALLS:  Has patient fallen in last 6 months? No   PLOF: Independent  PATIENT GOALS return to PLOF, no pain in foot.    OBJECTIVE: 03/17/22   COGNITION:  Overall cognitive status: Within functional limits for tasks assessed     SENSATION: Improved hypersensitivity   PALPATION: Well healed incision at lateral L ankle.   LE ROM:   03/17/22: Hips: WFL, Knee: WFL, Ankle: mild limitation for DF LE MMT:   03/17/22:  Hips: 4/5, Knees: 4+/5 , Ankle: inv: 4/5, Ev: 4-/5,  PF/DF 4/5      TODAY'S TREATMENT: 03/24/2022 Ther ex:   Prone:  Seated:   self massage with tennis ball to arch of foot - 3 minutes L, toe extension/flexion left  3 minutes Standing: single ladies ankle pumps x3 60" alternating, DF at wall 2x15 5" holds B, step downs slower 4" 2x10 L, side step ups 6" steps 2x15 L, tandem walk 5 minutes occ UE assist , gastroc stretch x3 30" holds B   PATIENT EDUCATION:  Education details:  HEP  Person educated: Patient Education method: Consulting civil engineer, Demonstration, Tactile cues, Verbal cues, and Handouts Education comprehension: verbalized understanding, returned demonstration, verbal cues required, tactile cues required, and needs further education   HOME EXERCISE PROGRAM: Access Code: [email or web address]  ASSESSMENT:  CLINICAL IMPRESSION: 03/24/2022 Continued to focus on strengthening and mobility. Slight discomfort initially coming down stairs but this resolved after DF at wall. Fatigue  in foot end of session but no pain. Ended with stretches and mobility of foot. Will continue with current POC as tolerated.     OBJECTIVE IMPAIRMENTS Abnormal gait, decreased activity tolerance, decreased balance, decreased endurance, decreased knowledge of use of DME, decreased mobility, difficulty walking, decreased ROM, decreased strength, increased muscle spasms, impaired flexibility, and pain.   ACTIVITY LIMITATIONS cleaning, community activity, driving, meal prep, occupation, laundry, medication management, yard work, and shopping.   PERSONAL FACTORS  none  are also affecting patient's functional outcome.    REHAB POTENTIAL: Good  CLINICAL DECISION MAKING: Stable/uncomplicated  EVALUATION COMPLEXITY: Low   GOALS: Goals reviewed with patient? Yes  SHORT TERM GOALS: Target date: 04/07/2022  Pt to be independent with initial HEP  Goal status: MET  2.  Pt to demo ability for independent ambulation, with PWB status and crutches, for at least 100 ft,   Goal status: MET  3.  Pt to be independent/safe for stair navigation with PWB status and use of crutches.   Goal status: MET    LONG TERM GOALS: Target date: 05/19/2022  Pt to be independent with final HEP  Goal status: IN PROGRESS  2.  Pt to demo full ROM of L ankle, to be WNL and pain free.   Goal status: IN PROGRESS  3.  Pt to demo improved strength and NMC of L ankle, to be WNL with standing and functional activity.   Goal status: IN PROGRESS  4.  Pt to demo ability for independent ambulation, with mechanics WNL,  for community distances, with pain 0-2/10, to return to community and exercise activities.   Goal status: IN PROGRESS  5.  Pt to demo ability for SLS for at least 30 sec on L foot, to improve stability and NMC.   Goal status: IN PROGRESS   PLAN: PT FREQUENCY: 1-2x/week  PT DURATION: 6 weeks  PLANNED INTERVENTIONS: Therapeutic exercises, Therapeutic activity, Neuromuscular re-education, Balance  training, Gait training, Patient/Family education, Joint manipulation, Joint mobilization, Stair training, Orthotic/Fit training, DME instructions, Dry Needling, Electrical stimulation, Spinal manipulation, Spinal mobilization, Cryotherapy, Moist heat, Taping, Vasopneumatic device, Traction, Ultrasound, Ionotophoresis 8m/ml Dexamethasone, and Manual therapy  PLAN FOR NEXT SESSION:   4:26 PM, 03/24/22 MJerene Pitch DPT Physical Therapy with CR[facility]ston Sinner

## 2022-03-26 ENCOUNTER — Ambulatory Visit (INDEPENDENT_AMBULATORY_CARE_PROVIDER_SITE_OTHER): Payer: No Typology Code available for payment source | Admitting: Physical Therapy

## 2022-03-26 ENCOUNTER — Encounter: Payer: Self-pay | Admitting: Physical Therapy

## 2022-03-26 DIAGNOSIS — M25572 Pain in left ankle and joints of left foot: Secondary | ICD-10-CM

## 2022-03-26 DIAGNOSIS — R2689 Other abnormalities of gait and mobility: Secondary | ICD-10-CM | POA: Diagnosis not present

## 2022-03-26 NOTE — Therapy (Signed)
OUTPATIENT PHYSICAL THERAPY LOWER EXTREMITY TREATMENT  Patient Name: Alanya Vukelich MRN: [id number] DOB:03/31/1976, 46 y.o., female Today's Date: 03/26/2022   PT End of Session - 03/26/22 1611     Visit Number 15    Number of Visits 24    Date for PT Re-Evaluation 04/28/22    Authorization Type [facility]    PT Start Time 1606    PT Stop Time 9150    PT Time Calculation (min) 31 min    Activity Tolerance Patient tolerated treatment well    Behavior During Therapy [facility] for tasks assessed/performed                 Past Medical History:  Diagnosis Date   B12 deficiency    Depression    Heart murmur    History of COVID-19 2020   per pt mild symptoms that resolved   Hypermetropia of both eyes 09/12/2014   IDA (iron deficiency anemia)    Impingement syndrome of right shoulder 05/21/2015   Lactose intolerance    per pt due to gastric bypass   Morbid obesity ([place]) 08/15/2017   Nevus of choroid of left eye 09/12/2014   being monitored   Palpitations    due ot SVT   Psoriasis    Seborrheic dermatitis 02/08/2015   Status post gastric bypass for obesity 12/08/2016   SVT (supraventricular tachycardia) [facility])    cardiologist--- dr Edyth Gunnels;    normal echo in epic 02-17-2021 and event monitor 02-04-2021  symptomatic SVT w/ 5 brief episodes the longest 14 beats   Vitamin D deficiency    Wears glasses    Past Surgical History:  Procedure Laterality Date   COLPOSCOPY  2008   ENDOMETRIAL BIOPSY  2017   negative per pt   LAPAROSCOPIC CHOLECYSTECTOMY  2006   MASS EXCISION Left 08/17/2017   Procedure: EXCISION SOFT TISSUE  MASS LEFT FLANK;  Surgeon: [name], MD;  Location: [place];  Service: General;  Laterality: Left;   ROUX-EN-Y GASTRIC BYPASS  2007   TENDON REPAIR Left 12/17/2021   Procedure: TENDON REPAIR OF LEFT FOOT;  Surgeon: Trula Slade, DPM;  Location: [place];  Service: Podiatry;  Laterality: Left;  GENERAL WITH BLOCK    Patient Active Problem List   Diagnosis Date Noted   Tear of peroneal tendon 02/05/2022   Tear of peroneal tendon of left foot 12/03/2021   Benign paroxysmal positional vertigo of right ear 07/15/2021   Preventative health care 06/18/2021   Bronchitis 05/06/2021   Left foot pain 04/09/2021   Weight gain 04/09/2021   Supraventricular tachycardia ([place]) 03/12/2021   Dyspnea on exertion 01/10/2021   Lactose intolerance    Joint pain    Gallbladder problem    Palpitations    Back pain    Anemia    Allergy    Soft tissue mass 08/16/2017   Morbid obesity ([place]) 08/15/2017   Obesity (BMI 35.0-39.9 without comorbidity) 12/23/2016   Status post gastric bypass for obesity 12/08/2016   Vitamin B 12 deficiency 12/08/2016   Depression 12/08/2016   B12 deficiency 10/30/2016   Multiple food allergies 10/30/2016   Vitamin D deficiency 10/19/2016   AC (acromioclavicular) arthritis 05/21/2015   Acute pain of right shoulder 05/21/2015   Biceps tendonitis on right 05/21/2015   Impingement syndrome of right shoulder 05/21/2015   Seborrheic dermatitis 02/08/2015   Hypermetropia of both eyes 09/12/2014   Nevus of choroid of left eye 09/12/2014   Psoriasis 01/15/2014  Compound nevus 01/15/2014    PCP: [name]  REFERRING PROVIDER: Celesta Gentile  REFERRING DIAG: S/P L peroneal tendon repair 12/17/21  THERAPY DIAG:  Pain in left ankle and joints of left foot  Other abnormalities of gait and mobility  ONSET DATE: 12/17/21    SUBJECTIVE:   SUBJECTIVE STATEMENT: 03/26/2022  Pt states doing well, has had minimal pain in foot. Wearing regular sneakers, minimal swelling, still wearing compression sock.    Eval:Pt had L peroneal tendon repair on 12/17/21. She has been in boot and NWB since surgery. She had f/u appt 4/25. States PWB at this time, will clarify other movement restrictions.  Reports increased pain in L ankle Last may, no injury , but was exercising. Has been in boot  since Nov due to pain prior to surgery.  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  Notes nerve pain on top of foot , just started gab pentin on Saturday for this.    PERTINENT HISTORY: Iron deficiency/anemia, SVT,  PAIN:  Are you having pain? Yes: NPRS scale: 0/10 Pain location: L heel  Pain description: sharp shooting Aggravating factors: at end of day Relieving factors: elevation  PRECAUTIONS: None  WEIGHT BEARING RESTRICTIONS Yes PWB   FALLS:  Has patient fallen in last 6 months? No   PLOF: Independent  PATIENT GOALS return to PLOF, no pain in foot.    OBJECTIVE: 03/17/22   COGNITION:  Overall cognitive status: Within functional limits for tasks assessed     SENSATION: Improved hypersensitivity   PALPATION: Well healed incision at lateral L ankle.   LE ROM:   03/17/22: Hips: WFL, Knee: WFL, Ankle: mild limitation for DF LE MMT:   03/17/22:  Hips: 4/5, Knees: 4+/5 , Ankle: inv: 4/5, Ev: 4-/5,  PF/DF 4/5      TODAY'S TREATMENT: 03/26/2022 Ther ex:   Aerobic: Bike L2 x 8 min  Prone:  Seated:    Standing:  DF at wall 2x15 5" holds B,   tandem walk 15 ft x 6 occ UE assist , Step ups fwd/lat on AirEx x 15 ea bil; Tandem stance 30 sec x 3 bil (no shoes);   SLS 30sec x 2 bil; (no shoes) ; HR x 20 bil (no shoes) ; Squats on AirEx x 20;    Previous: Seated:   self massage with tennis ball to arch of foot - 3 minutes L, toe extension/flexion left  3 minutes Standing: single ladies ankle pumps x3 60" alternating,  DF at wall 2x15 5" holds B,   step downs slower 4" 2x10 L, side step ups 6" steps 2x15 L, tandem walk 5 minutes occ UE assist ,  gastroc stretch x3 30" holds B   PATIENT EDUCATION:  Education details:  HEP reviewed   Person educated: Patient Education method: Explanation, Demonstration, Tactile cues, Verbal cues, and Handouts Education comprehension: verbalized  understanding, returned demonstration, verbal cues required, tactile cues required, and needs further education   HOME EXERCISE PROGRAM: Access Code: [email or web address]  ASSESSMENT:  CLINICAL IMPRESSION: 03/26/2022 Pt with good ability for progression of strength and stability. No pain in foot today with activities. She will benefit from continued strength and stability of L ankle.     OBJECTIVE IMPAIRMENTS Abnormal gait, decreased activity tolerance, decreased balance, decreased endurance, decreased knowledge of use of DME, decreased mobility, difficulty walking, decreased ROM, decreased strength, increased muscle spasms, impaired flexibility, and pain.  ACTIVITY LIMITATIONS cleaning, community activity, driving, meal prep, occupation, laundry, medication management, yard work, and shopping.   PERSONAL FACTORS  none  are also affecting patient's functional outcome.    REHAB POTENTIAL: Good  CLINICAL DECISION MAKING: Stable/uncomplicated  EVALUATION COMPLEXITY: Low   GOALS: Goals reviewed with patient? Yes  SHORT TERM GOALS: Target date: 04/09/2022  Pt to be independent with initial HEP  Goal status: MET  2.  Pt to demo ability for independent ambulation, with PWB status and crutches, for at least 100 ft,   Goal status: MET  3.  Pt to be independent/safe for stair navigation with PWB status and use of crutches.   Goal status: MET    LONG TERM GOALS: Target date: 05/21/2022  Pt to be independent with final HEP  Goal status: IN PROGRESS  2.  Pt to demo full ROM of L ankle, to be WNL and pain free.   Goal status: IN PROGRESS  3.  Pt to demo improved strength and NMC of L ankle, to be WNL with standing and functional activity.   Goal status: IN PROGRESS  4.  Pt to demo ability for independent ambulation, with mechanics WNL,  for community distances, with pain 0-2/10, to return to community and exercise activities.   Goal status: IN PROGRESS  5.  Pt to demo ability for  SLS for at least 30 sec on L foot, to improve stability and NMC.   Goal status: IN PROGRESS   PLAN: PT FREQUENCY: 1-2x/week  PT DURATION: 6 weeks  PLANNED INTERVENTIONS: Therapeutic exercises, Therapeutic activity, Neuromuscular re-education, Balance training, Gait training, Patient/Family education, Joint manipulation, Joint mobilization, Stair training, Orthotic/Fit training, DME instructions, Dry Needling, Electrical stimulation, Spinal manipulation, Spinal mobilization, Cryotherapy, Moist heat, Taping, Vasopneumatic device, Traction, Ultrasound, Ionotophoresis 93m/ml Dexamethasone, and Manual therapy  PLAN FOR NEXT SESSION:   LLyndee Hensen PT, DPT 4:42 PM  03/26/22

## 2022-03-31 ENCOUNTER — Encounter: Payer: Self-pay | Admitting: Physical Therapy

## 2022-03-31 ENCOUNTER — Ambulatory Visit (INDEPENDENT_AMBULATORY_CARE_PROVIDER_SITE_OTHER): Payer: No Typology Code available for payment source | Admitting: Physical Therapy

## 2022-03-31 ENCOUNTER — Ambulatory Visit (INDEPENDENT_AMBULATORY_CARE_PROVIDER_SITE_OTHER): Payer: No Typology Code available for payment source | Admitting: Podiatry

## 2022-03-31 DIAGNOSIS — M25572 Pain in left ankle and joints of left foot: Secondary | ICD-10-CM

## 2022-03-31 DIAGNOSIS — R2689 Other abnormalities of gait and mobility: Secondary | ICD-10-CM | POA: Diagnosis not present

## 2022-03-31 DIAGNOSIS — M7672 Peroneal tendinitis, left leg: Secondary | ICD-10-CM

## 2022-03-31 DIAGNOSIS — M722 Plantar fascial fibromatosis: Secondary | ICD-10-CM

## 2022-04-03 ENCOUNTER — Encounter: Payer: No Typology Code available for payment source | Admitting: Physical Therapy

## 2022-04-08 ENCOUNTER — Encounter: Payer: Self-pay | Admitting: Physical Therapy

## 2022-04-08 ENCOUNTER — Ambulatory Visit (INDEPENDENT_AMBULATORY_CARE_PROVIDER_SITE_OTHER): Payer: No Typology Code available for payment source | Admitting: Physical Therapy

## 2022-04-08 DIAGNOSIS — M25572 Pain in left ankle and joints of left foot: Secondary | ICD-10-CM | POA: Diagnosis not present

## 2022-04-08 DIAGNOSIS — R2689 Other abnormalities of gait and mobility: Secondary | ICD-10-CM | POA: Diagnosis not present

## 2022-04-08 NOTE — Therapy (Signed)
OUTPATIENT PHYSICAL THERAPY LOWER EXTREMITY TREATMENT  Patient Name: Emily [name] MRN: [id number] DOB:1976/03/03, 46 y.o., female Today's Date: 03/26/2022   PT End of Session - 04/08/22 0820     Visit Number 17    Number of Visits 24    Date for PT Re-Evaluation 04/28/22    Authorization Type [facility]    PT Start Time 0845    PT Stop Time 0925    PT Time Calculation (min) 40 min    Activity Tolerance Patient tolerated treatment well    Behavior During Therapy [facility] for tasks assessed/performed                 Past Medical History:  Diagnosis Date   B12 deficiency    Depression    Heart murmur    History of COVID-19 2020   per pt mild symptoms that resolved   Hypermetropia of both eyes 09/12/2014   IDA (iron deficiency anemia)    Impingement syndrome of right shoulder 05/21/2015   Lactose intolerance    per pt due to gastric bypass   Morbid obesity ([place]) 08/15/2017   Nevus of choroid of left eye 09/12/2014   being monitored   Palpitations    due ot SVT   Psoriasis    Seborrheic dermatitis 02/08/2015   Status post gastric bypass for obesity 12/08/2016   SVT (supraventricular tachycardia) [facility])    cardiologist--- dr Edyth Gunnels;    normal echo in epic 02-17-2021 and event monitor 02-04-2021  symptomatic SVT w/ 5 brief episodes the longest 14 beats   Vitamin D deficiency    Wears glasses    Past Surgical History:  Procedure Laterality Date   COLPOSCOPY  2008   ENDOMETRIAL BIOPSY  2017   negative per pt   LAPAROSCOPIC CHOLECYSTECTOMY  2006   MASS EXCISION Left 08/17/2017   Procedure: EXCISION SOFT TISSUE  MASS LEFT FLANK;  Surgeon: [name], MD;  Location: [place];  Service: General;  Laterality: Left;   ROUX-EN-Y GASTRIC BYPASS  2007   TENDON REPAIR Left 12/17/2021   Procedure: TENDON REPAIR OF LEFT FOOT;  Surgeon: Trula Slade, DPM;  Location: [place];  Service: Podiatry;  Laterality: Left;  GENERAL WITH BLOCK    Patient Active Problem List   Diagnosis Date Noted   Tear of peroneal tendon 02/05/2022   Tear of peroneal tendon of left foot 12/03/2021   Benign paroxysmal positional vertigo of right ear 07/15/2021   Preventative health care 06/18/2021   Bronchitis 05/06/2021   Left foot pain 04/09/2021   Weight gain 04/09/2021   Supraventricular tachycardia ([place]) 03/12/2021   Dyspnea on exertion 01/10/2021   Lactose intolerance    Joint pain    Gallbladder problem    Palpitations    Back pain    Anemia    Allergy    Soft tissue mass 08/16/2017   Morbid obesity ([place]) 08/15/2017   Obesity (BMI 35.0-39.9 without comorbidity) 12/23/2016   Status post gastric bypass for obesity 12/08/2016   Vitamin B 12 deficiency 12/08/2016   Depression 12/08/2016   B12 deficiency 10/30/2016   Multiple food allergies 10/30/2016   Vitamin D deficiency 10/19/2016   AC (acromioclavicular) arthritis 05/21/2015   Acute pain of right shoulder 05/21/2015   Biceps tendonitis on right 05/21/2015   Impingement syndrome of right shoulder 05/21/2015   Seborrheic dermatitis 02/08/2015   Hypermetropia of both eyes 09/12/2014   Nevus of choroid of left eye 09/12/2014   Psoriasis 01/15/2014  Compound nevus 01/15/2014    PCP: [name]  REFERRING PROVIDER: Celesta Gentile  REFERRING DIAG: S/P L peroneal tendon repair 12/17/21  THERAPY DIAG:  Pain in left ankle and joints of left foot  Other abnormalities of gait and mobility  ONSET DATE: 12/17/21    SUBJECTIVE:   SUBJECTIVE STATEMENT: 04/08/2022  Reports she saw the MD and he is happy with progress and that she is feeling good just continued band like pain around the foot. States she got out of the pool without any difficulties.    Eval:Pt had L peroneal tendon repair on 12/17/21. She has been in boot and NWB since surgery. She had f/u appt 4/25. States PWB at this time, will clarify other movement restrictions.  Reports increased pain in L ankle  Last may, no injury , but was exercising. Has been in boot since Nov due to pain prior to surgery.  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  Notes nerve pain on top of foot , just started gab pentin on Saturday for this.    PERTINENT HISTORY: Iron deficiency/anemia, SVT,  PAIN:  Are you having pain? Yes: NPRS scale: 0/10 Pain location: L heel  Pain description: sharp shooting Aggravating factors: at end of day Relieving factors: elevation  PRECAUTIONS: None  WEIGHT BEARING RESTRICTIONS Yes PWB   FALLS:  Has patient fallen in last 6 months? No   PLOF: Independent  PATIENT GOALS return to PLOF, no pain in foot.    OBJECTIVE: 03/17/22   COGNITION:  Overall cognitive status: Within functional limits for tasks assessed     SENSATION: Improved hypersensitivity   PALPATION: Well healed incision at lateral L ankle.   LE ROM:   03/17/22: Hips: WFL, Knee: WFL, Ankle: mild limitation for DF LE MMT:   03/17/22:  Hips: 4/5, Knees: 4+/5 , Ankle: inv: 4/5, Ev: 4-/5,  PF/DF 4/5      TODAY'S TREATMENT: 04/08/2022 Ther ex:   Aerobic:  Prone:  Seated:    Standing:  single ladies ankle pumps 2 minutes, slow walking with 2-10# DB then one 10 # dumbbell - 8 minutes total, sumo squats x10, side lunges 4x3 B     Neuro: SLS on blue foam x4 30" holds B visual cues, step up knee drives on blue foam 2I29 B 2" holds visual cues, slow [place] for coordination x2 laps B   Previous: Seated:   self massage with tennis ball to arch of foot - 3 minutes L, toe extension/flexion left  3 minutes Standing: single ladies ankle pumps x3 60" alternating,  DF at wall 2x15 5" holds B,   step downs slower 4" 2x10 L, side step ups 6" steps 2x15 L, tandem walk 5 minutes occ UE assist ,  gastroc stretch x3 30" holds B   PATIENT EDUCATION:  Education details:  HEP  Person educated: Patient Education method:  Consulting civil engineer, Demonstration, Tactile cues, Verbal cues, and Handouts Education comprehension: verbalized understanding, returned demonstration, verbal cues required, tactile cues required, and needs further education   HOME EXERCISE PROGRAM: Access Code: [email or web address]  ASSESSMENT:  CLINICAL IMPRESSION: 04/08/2022 Continued to progress balance and functional movements. This was tolerated well. Overall patient is doing well. No pain noted during session but fatigue in legs noted.Will continue with current POC as indicated.    OBJECTIVE IMPAIRMENTS Abnormal gait, decreased activity tolerance, decreased balance, decreased endurance, decreased knowledge of use of DME, decreased  mobility, difficulty walking, decreased ROM, decreased strength, increased muscle spasms, impaired flexibility, and pain.   ACTIVITY LIMITATIONS cleaning, community activity, driving, meal prep, occupation, laundry, medication management, yard work, and shopping.   PERSONAL FACTORS  none  are also affecting patient's functional outcome.    REHAB POTENTIAL: Good  CLINICAL DECISION MAKING: Stable/uncomplicated  EVALUATION COMPLEXITY: Low   GOALS: Goals reviewed with patient? Yes  SHORT TERM GOALS: Target date: 04/22/2022  Pt to be independent with initial HEP  Goal status: MET  2.  Pt to demo ability for independent ambulation, with PWB status and crutches, for at least 100 ft,   Goal status: MET  3.  Pt to be independent/safe for stair navigation with PWB status and use of crutches.   Goal status: MET    LONG TERM GOALS: Target date: 06/03/2022  Pt to be independent with final HEP  Goal status: IN PROGRESS  2.  Pt to demo full ROM of L ankle, to be WNL and pain free.   Goal status: IN PROGRESS  3.  Pt to demo improved strength and NMC of L ankle, to be WNL with standing and functional activity.   Goal status: IN PROGRESS  4.  Pt to demo ability for independent ambulation, with mechanics WNL,  for  community distances, with pain 0-2/10, to return to community and exercise activities.   Goal status: IN PROGRESS  5.  Pt to demo ability for SLS for at least 30 sec on L foot, to improve stability and NMC.   Goal status: IN PROGRESS   PLAN: PT FREQUENCY: 1-2x/week  PT DURATION: 6 weeks  PLANNED INTERVENTIONS: Therapeutic exercises, Therapeutic activity, Neuromuscular re-education, Balance training, Gait training, Patient/Family education, Joint manipulation, Joint mobilization, Stair training, Orthotic/Fit training, DME instructions, Dry Needling, Electrical stimulation, Spinal manipulation, Spinal mobilization, Cryotherapy, Moist heat, Taping, Vasopneumatic device, Traction, Ultrasound, Ionotophoresis 6m/ml Dexamethasone, and Manual therapy  PLAN FOR NEXT SESSION:   9:27 AM, 04/08/22 MJerene Pitch DPT Physical Therapy with [facility] Sinner

## 2022-04-10 ENCOUNTER — Telehealth: Payer: Self-pay | Admitting: Family

## 2022-04-10 ENCOUNTER — Other Ambulatory Visit (HOSPITAL_BASED_OUTPATIENT_CLINIC_OR_DEPARTMENT_OTHER): Payer: Self-pay

## 2022-04-10 MED ORDER — WEGOVY 1.7 MG/0.75ML ~~LOC~~ SOAJ
1.7000 mg | SUBCUTANEOUS | 1 refills | Status: DC
  Filled 2022-04-10: qty 3, 28d supply, fill #0

## 2022-04-10 NOTE — Telephone Encounter (Signed)
Pt requesting next dose of Wegovy 1.7.  Currently tolerating the '1mg'$  but has not had any further weight loss on this dose.

## 2022-04-13 ENCOUNTER — Ambulatory Visit (INDEPENDENT_AMBULATORY_CARE_PROVIDER_SITE_OTHER): Payer: No Typology Code available for payment source | Admitting: Physical Therapy

## 2022-04-13 ENCOUNTER — Encounter: Payer: Self-pay | Admitting: Physical Therapy

## 2022-04-13 DIAGNOSIS — M25572 Pain in left ankle and joints of left foot: Secondary | ICD-10-CM

## 2022-04-13 DIAGNOSIS — R2689 Other abnormalities of gait and mobility: Secondary | ICD-10-CM | POA: Diagnosis not present

## 2022-04-13 NOTE — Therapy (Signed)
OUTPATIENT PHYSICAL THERAPY LOWER EXTREMITY TREATMENT/Discharge   Patient Name: Emily [name] MRN: [id number] DOB:11/30/1975, 46 y.o., female Today's Date: 04/13/2022   PT End of Session - 04/13/22 1553     Visit Number 18    Number of Visits 24    Date for PT Re-Evaluation 04/28/22    Authorization Type [facility]    PT Start Time 1515    PT Stop Time 1545    PT Time Calculation (min) 30 min    Activity Tolerance Patient tolerated treatment well    Behavior During Therapy [facility] for tasks assessed/performed                  Past Medical History:  Diagnosis Date   B12 deficiency    Depression    Heart murmur    History of COVID-19 2020   per pt mild symptoms that resolved   Hypermetropia of both eyes 09/12/2014   IDA (iron deficiency anemia)    Impingement syndrome of right shoulder 05/21/2015   Lactose intolerance    per pt due to gastric bypass   Morbid obesity ([place]) 08/15/2017   Nevus of choroid of left eye 09/12/2014   being monitored   Palpitations    due ot SVT   Psoriasis    Seborrheic dermatitis 02/08/2015   Status post gastric bypass for obesity 12/08/2016   SVT (supraventricular tachycardia) [facility])    cardiologist--- dr Edyth Gunnels;    normal echo in epic 02-17-2021 and event monitor 02-04-2021  symptomatic SVT w/ 5 brief episodes the longest 14 beats   Vitamin D deficiency    Wears glasses    Past Surgical History:  Procedure Laterality Date   COLPOSCOPY  2008   ENDOMETRIAL BIOPSY  2017   negative per pt   LAPAROSCOPIC CHOLECYSTECTOMY  2006   MASS EXCISION Left 08/17/2017   Procedure: EXCISION SOFT TISSUE  MASS LEFT FLANK;  Surgeon: [name], MD;  Location: [place];  Service: General;  Laterality: Left;   ROUX-EN-Y GASTRIC BYPASS  2007   TENDON REPAIR Left 12/17/2021   Procedure: TENDON REPAIR OF LEFT FOOT;  Surgeon: Trula Slade, DPM;  Location: [place];  Service: Podiatry;  Laterality: Left;   GENERAL WITH BLOCK   Patient Active Problem List   Diagnosis Date Noted   Tear of peroneal tendon 02/05/2022   Tear of peroneal tendon of left foot 12/03/2021   Benign paroxysmal positional vertigo of right ear 07/15/2021   Preventative health care 06/18/2021   Bronchitis 05/06/2021   Left foot pain 04/09/2021   Weight gain 04/09/2021   Supraventricular tachycardia ([place]) 03/12/2021   Dyspnea on exertion 01/10/2021   Lactose intolerance    Joint pain    Gallbladder problem    Palpitations    Back pain    Anemia    Allergy    Soft tissue mass 08/16/2017   Morbid obesity ([place]) 08/15/2017   Obesity (BMI 35.0-39.9 without comorbidity) 12/23/2016   Status post gastric bypass for obesity 12/08/2016   Vitamin B 12 deficiency 12/08/2016   Depression 12/08/2016   B12 deficiency 10/30/2016   Multiple food allergies 10/30/2016   Vitamin D deficiency 10/19/2016   AC (acromioclavicular) arthritis 05/21/2015   Acute pain of right shoulder 05/21/2015   Biceps tendonitis on right 05/21/2015   Impingement syndrome of right shoulder 05/21/2015   Seborrheic dermatitis 02/08/2015   Hypermetropia of both eyes 09/12/2014   Nevus of choroid of left eye 09/12/2014   Psoriasis  01/15/2014   Compound nevus 01/15/2014    PCP: [name]  REFERRING PROVIDER: Celesta Gentile  REFERRING DIAG: S/P L peroneal tendon repair 12/17/21  THERAPY DIAG:  Pain in left ankle and joints of left foot  Other abnormalities of gait and mobility  ONSET DATE: 12/17/21    SUBJECTIVE:   SUBJECTIVE STATEMENT: 04/13/2022  Pt doing very well at this time, has had no pain.    PERTINENT HISTORY: Iron deficiency/anemia, SVT,  PAIN:  Are you having pain? Yes: NPRS scale: 0/10 Pain location: L heel  Pain description: sharp shooting Aggravating factors: at end of day Relieving factors: elevation  PRECAUTIONS: None  WEIGHT BEARING RESTRICTIONS Yes PWB   FALLS:  Has patient fallen in last 6 months?  No   PLOF: Independent  PATIENT GOALS return to PLOF, no pain in foot.    OBJECTIVE: 03/17/22   COGNITION:  Overall cognitive status: Within functional limits for tasks assessed     SENSATION: Improved hypersensitivity   PALPATION: Well healed incision at lateral L ankle.   LE ROM:   03/17/22: Hips: WFL, Knee: WFL, Ankle: mild limitation for DF LE MMT:   03/17/22:  Hips: 4/5, Knees: 4+/5 , Ankle: inv: 4/5, Ev: 4-/5,  PF/DF 4/5      TODAY'S TREATMENT: 04/13/2022 Ther ex:   Aerobic:  Prone:  Seated:    Standing:  squats 20 lb x15;  fwd lunges x15;  HR x 20;  SLS 30 sec x 2 bil;  SLS with UE rotation x 10 bil; SLS with runner lunge x 10 bil;  Step ups with knee drive on AirEx x 10 bil;   Gastroc and Soleus stretch at wall 30 sec x 2 ea bil;    PATIENT EDUCATION:  Education details:  updated and reviewed final HEP  Person educated: Patient Education method: Explanation, Demonstration, Tactile cues, Verbal cues, and Handouts Education comprehension: verbalized understanding, returned demonstration, verbal cues required, tactile cues required, and needs further education   HOME EXERCISE PROGRAM: Access Code: [email or web address]  ASSESSMENT:  CLINICAL IMPRESSION: 04/13/2022 Pt doing very well at this time, has not had any pain in a couple weeks. She has been able to progress strength and stability for foot and ankle, and is also been able to wear a few different shoes without increased pain. She has met all goals at this time, and is ready for d/c to HEP. Reviewed final HEP in detail, and importance of continuing this. Pt ready for d/c, pt in agreement with plan.    OBJECTIVE IMPAIRMENTS Abnormal gait, decreased activity tolerance, decreased balance, decreased endurance, decreased knowledge of use of DME, decreased mobility, difficulty walking, decreased ROM, decreased strength, increased muscle spasms, impaired flexibility, and pain.   ACTIVITY LIMITATIONS cleaning, community  activity, driving, meal prep, occupation, laundry, medication management, yard work, and shopping.   PERSONAL FACTORS  none  are also affecting patient's functional outcome.    REHAB POTENTIAL: Good  CLINICAL DECISION MAKING: Stable/uncomplicated  EVALUATION COMPLEXITY: Low   GOALS: Goals reviewed with patient? Yes  SHORT TERM GOALS: Target date: 04/27/2022  Pt to be independent with initial HEP  Goal status: MET  2.  Pt to demo ability for independent ambulation, with PWB status and crutches, for at least 100 ft,   Goal status: MET  3.  Pt to be independent/safe for stair navigation with PWB status and use of crutches.   Goal status: MET    LONG TERM GOALS: Target date: 06/08/2022  Pt to be independent  with final HEP  Goal status: MET  2.  Pt to demo full ROM of L ankle, to be WNL and pain free.   Goal status: MET  3.  Pt to demo improved strength and NMC of L ankle, to be WNL with standing and functional activity.   Goal status: MET  4.  Pt to demo ability for independent ambulation, with mechanics WNL,  for community distances, with pain 0-2/10, to return to community and exercise activities.   Goal status: MET  5.  Pt to demo ability for SLS for at least 30 sec on L foot, to improve stability and NMC.   Goal status: MET   PLAN: PT FREQUENCY: 1-2x/week  PT DURATION: 6 weeks  PLANNED INTERVENTIONS: Therapeutic exercises, Therapeutic activity, Neuromuscular re-education, Balance training, Gait training, Patient/Family education, Joint manipulation, Joint mobilization, Stair training, Orthotic/Fit training, DME instructions, Dry Needling, Electrical stimulation, Spinal manipulation, Spinal mobilization, Cryotherapy, Moist heat, Taping, Vasopneumatic device, Traction, Ultrasound, Ionotophoresis 82m/ml Dexamethasone, and Manual therapy  PLAN FOR NEXT SESSION:   LLyndee Hensen PT, DPT 3:55 PM  04/13/22   PHYSICAL THERAPY DISCHARGE  SUMMARY   Plan: Patient agrees to discharge.  Patient goals were  met. Patient is being discharged due to meeting the stated rehab goals.      LLyndee Hensen PT, DPT 3:56 PM  04/13/22

## 2022-04-13 NOTE — Progress Notes (Signed)
Subjective: Emily [name] is a 46 y.o. is seen today in office s/p left peroneal tendon repair preformed on 12/17/2021.  She said that she is doing much better.  The heel pain is also much improved.  She is wearing regular shoes.  She is still doing physical therapy.  No recent injury or change otherwise.  No new concerns.     Objective: General: No acute distress, AAOx3  DP/PT pulses palpable 2/4, CRT < 3 sec to all digits.  Protective sensation intact. Motor function intact.  left foot: Incision is well coapted without any evidence of dehiscence.  Scar is well formed.  There is no significant tenderness palpation along the course or insertion of the peroneal tendons today.  There is no tenderness along the course or insertion of plantar fascia.  No area pinpoint tenderness.  No pain Achilles tendon.  No other areas of discomfort noted. No pain with calf compression, swelling, warmth, erythema.   Assessment and Plan:  Status post left peroneal tendon repair, plantar fasciitis  -Treatment options discussed including all alternatives, risks, and complications -At this time her symptoms much improved and she is wearing regular shoe.  Continue physical therapy.  When she finishes formal physical therapy continue home rehab exercises.  Continue compression for any postoperative edema as well as icing.  Trula Slade DPM

## 2022-04-14 ENCOUNTER — Encounter: Payer: Self-pay | Admitting: Certified Registered Nurse Anesthetist

## 2022-04-15 ENCOUNTER — Encounter: Payer: No Typology Code available for payment source | Admitting: Physical Therapy

## 2022-04-16 ENCOUNTER — Encounter: Payer: Self-pay | Admitting: Gastroenterology

## 2022-04-16 ENCOUNTER — Ambulatory Visit (AMBULATORY_SURGERY_CENTER): Payer: No Typology Code available for payment source | Admitting: Gastroenterology

## 2022-04-16 VITALS — BP 114/51 | HR 65 | Temp 96.9°F | Resp 11 | Ht 64.0 in | Wt 205.0 lb

## 2022-04-16 DIAGNOSIS — Z1211 Encounter for screening for malignant neoplasm of colon: Secondary | ICD-10-CM

## 2022-04-16 MED ORDER — SODIUM CHLORIDE 0.9 % IV SOLN: 500.0000 mL | Freq: Once | INTRAVENOUS | Status: DC

## 2022-04-16 NOTE — Progress Notes (Signed)
Report given to PACU, vss 

## 2022-04-16 NOTE — Progress Notes (Signed)
VS completed by DT.  Pt's states no medical or surgical changes since previsit or office visit.  

## 2022-04-16 NOTE — Progress Notes (Signed)
History and Physical:  This patient presents for endoscopic testing for: Encounter Diagnosis  Name Primary?   Special screening for malignant neoplasms, colon Yes    Average risk for colorectal cancer, this is her first screening colonoscopy. Patient denies chronic abdominal pain, rectal bleeding, constipation or diarrhea.   Patient is otherwise without complaints or active issues today.   Past Medical History: Past Medical History:  Diagnosis Date   B12 deficiency    Depression    Heart murmur    History of COVID-19 2020   per pt mild symptoms that resolved   Hypermetropia of both eyes 09/12/2014   IDA (iron deficiency anemia)    Impingement syndrome of right shoulder 05/21/2015   Lactose intolerance    per pt due to gastric bypass   Morbid obesity ([place]) 08/15/2017   Nevus of choroid of left eye 09/12/2014   being monitored   Palpitations    due ot SVT   Psoriasis    Seborrheic dermatitis 02/08/2015   Status post gastric bypass for obesity 12/08/2016   SVT (supraventricular tachycardia) [facility])    cardiologist--- dr Edyth Gunnels;    normal echo in epic 02-17-2021 and event monitor 02-04-2021  symptomatic SVT w/ 5 brief episodes the longest 14 beats   Vitamin D deficiency    Wears glasses      Past Surgical History: Past Surgical History:  Procedure Laterality Date   COLPOSCOPY  2008   ENDOMETRIAL BIOPSY  2017   negative per pt   LAPAROSCOPIC CHOLECYSTECTOMY  2006   MASS EXCISION Left 08/17/2017   Procedure: EXCISION SOFT TISSUE  MASS LEFT FLANK;  Surgeon: [name], MD;  Location: [place];  Service: General;  Laterality: Left;   ROUX-EN-Y GASTRIC BYPASS  2007   TENDON REPAIR Left 12/17/2021   Procedure: TENDON REPAIR OF LEFT FOOT;  Surgeon: Trula Slade, DPM;  Location: [place];  Service: Podiatry;  Laterality: Left;  GENERAL WITH BLOCK    Allergies: Allergies  Allergen Reactions   Almond Oil Swelling   Apple Juice  Swelling   Kiwi Extract Swelling   Soy Allergy Itching    Mouth/ tongue itch   Strawberry Extract Itching    Mouth/ tongue itch    Outpatient Meds: Current Outpatient Medications  Medication Sig Dispense Refill   B Complex-C (SUPER B COMPLEX PO) Take by mouth.     Barberry-Oreg Grape-Goldenseal (BERBERINE COMPLEX PO) Take by mouth.     buPROPion (WELLBUTRIN XL) 150 MG 24 hr tablet TAKE 1 TABLET (150 MG TOTAL) BY MOUTH DAILY. 90 tablet 1   cetirizine (ZYRTEC) 10 MG tablet Take 10 mg by mouth daily.     diltiazem (CARDIZEM CD) 180 MG 24 hr capsule Take 1 capsule (180 mg total) by mouth daily. (Patient taking differently: Take 180 mg by mouth daily.) 90 capsule 3   folic acid (FOLVITE) 1 MG tablet Take 1 mg by mouth daily.     gabapentin (NEURONTIN) 100 MG capsule Take 1 capsule (100 mg total) by mouth 3 (three) times daily. 90 capsule 2   ketoconazole (NIZORAL) 2 % shampoo Apply 1 application. topically daily.     norethindrone-ethinyl estradiol-iron (MICROGESTIN FE 1.5/30) 1.5-30 MG-MCG tablet Take 1 tablet by mouth daily. (Patient taking differently: Take 1 tablet by mouth daily.) 84 tablet 4   clobetasol (TEMOVATE) 0.05 % external solution Apply 1 application topically 2 (two) times daily. 50 mL 1   cyanocobalamin (,VITAMIN B-12,) 1000 MCG/ML injection Inject 1 mL (  1,000 mcg total) into the muscle once a week. (Patient taking differently: Inject 1,000 mcg into the muscle once a week. Sunday's) 12 mL 2   docusate sodium (COLACE) 100 MG capsule Take 100 mg by mouth every other day.     Ferrous Sulfate (IRON) 325 (65 Fe) MG TABS Take 1 tablet (325 mg total) by mouth every other day. 30 tablet 0   Semaglutide-Weight Management (WEGOVY) 1.7 MG/0.75ML SOAJ Inject 1.7 mg into the skin once a week. 3 mL 1   Current Facility-Administered Medications  Medication Dose Route Frequency Provider Last Rate Last Admin   0.9 %  sodium chloride infusion  500 mL Intravenous Once Danis, Estill Cotta III, MD           ___________________________________________________________________ Objective   Exam:  BP (!) 124/53   Pulse (!) 58   Temp (!) 96.9 F (36.1 C) (Temporal)   Ht '5\' 4"'$  (1.626 m)   Wt 205 lb (93 kg)   SpO2 98%   BMI 35.19 kg/m   CV: RRR without murmur, S1/S2 Resp: clear to auscultation bilaterally, normal RR and effort noted GI: soft, no tenderness, with active bowel sounds.   Assessment: Encounter Diagnosis  Name Primary?   Special screening for malignant neoplasms, colon Yes     Plan: Colonoscopy  The benefits and risks of the planned procedure were described in detail with the patient or (when appropriate) their health care proxy.  Risks were outlined as including, but not limited to, bleeding, infection, perforation, adverse medication reaction leading to cardiac or pulmonary decompensation, pancreatitis (if ERCP).  The limitation of incomplete mucosal visualization was also discussed.  No guarantees or warranties were given.    The patient is appropriate for an endoscopic procedure in the ambulatory setting.   - Wilfrid Lund, MD

## 2022-04-16 NOTE — Op Note (Signed)
[facility] Patient Name: Emily [name] Procedure Date: 04/16/2022 10:32 AM MRN: [id number] Endoscopist: [facility] Loletha Carrow , MD Age: 46 Referring MD:  Date of Birth: 1976/05/16 Gender: Female Account #: [id number] Procedure:                Colonoscopy Indications:              Screening for colorectal malignant neoplasm, This                            is the patient's first colonoscopy Medicines:                Monitored Anesthesia Care Procedure:                Pre-Anesthesia Assessment:                           - Prior to the procedure, a History and Physical                            was performed, and patient medications and                            allergies were reviewed. The patient's tolerance of                            previous anesthesia was also reviewed. The risks                            and benefits of the procedure and the sedation                            options and risks were discussed with the patient.                            All questions were answered, and informed consent                            was obtained. Prior Anticoagulants: The patient has                            taken no previous anticoagulant or antiplatelet                            agents. ASA Grade Assessment: II - A patient with                            mild systemic disease. After reviewing the risks                            and benefits, the patient was deemed in                            satisfactory condition to undergo the procedure.  After obtaining informed consent, the colonoscope                            was passed under direct vision. Throughout the                            procedure, the patient's blood pressure, pulse, and                            oxygen saturations were monitored continuously. The                            CF [id number] #[id number] was introduced through the anus                            and advanced to the  the cecum, identified by                            appendiceal orifice and ileocecal valve. The                            colonoscopy was performed without difficulty. The                            patient tolerated the procedure well. The quality                            of the bowel preparation was excellent. The                            ileocecal valve, appendiceal orifice, and rectum                            were photographed. Scope In: 11:01:45 AM Scope Out: 11:16:05 AM Scope Withdrawal Time: 0 hours 10 minutes 38 seconds  Total Procedure Duration: 0 hours 14 minutes 20 seconds  Findings:                 The perianal and digital rectal examinations were                            normal.                           Repeat examination of right colon under NBI                            performed.                           There is no endoscopic evidence of polyps in the                            entire colon.  Internal hemorrhoids were found. The hemorrhoids                            were small.                           The exam was otherwise without abnormality on                            direct and retroflexion views. Complications:            No immediate complications. Estimated Blood Loss:     Estimated blood loss: none. Impression:               - Internal hemorrhoids.                           - The examination was otherwise normal on direct                            and retroflexion views.                           - No specimens collected. Recommendation:           - Patient has a contact number available for                            emergencies. The signs and symptoms of potential                            delayed complications were discussed with the                            patient. Return to normal activities tomorrow.                            Written discharge instructions were provided to the                             patient.                           - Resume previous diet.                           - Continue present medications.                           - Repeat colonoscopy in 10 years for screening                            purposes. Ardon Franklin L. Loletha Carrow, MD 04/16/2022 11:19:16 AM This report has been signed electronically.

## 2022-04-16 NOTE — Patient Instructions (Signed)
Thank you for allowing [name] to care for you today! Recommend next screening coloscopy in 10 years. Resume previous diet and medications today. Return to normal daily activities tomorrow.  YOU HAD AN ENDOSCOPIC PROCEDURE TODAY AT [facility]:   Refer to the procedure report that was given to you for any specific questions about what was found during the examination.  If the procedure report does not answer your questions, please call your gastroenterologist to clarify.  If you requested that your care partner not be given the details of your procedure findings, then the procedure report has been included in a sealed envelope for you to review at your convenience later.  YOU SHOULD EXPECT: Some feelings of bloating in the abdomen. Passage of more gas than usual.  Walking can help get rid of the air that was put into your GI tract during the procedure and reduce the bloating. If you had a lower endoscopy (such as a colonoscopy or flexible sigmoidoscopy) you may notice spotting of blood in your stool or on the toilet paper. If you underwent a bowel prep for your procedure, you may not have a normal bowel movement for a few days.  Please Note:  You might notice some irritation and congestion in your nose or some drainage.  This is from the oxygen used during your procedure.  There is no need for concern and it should clear up in a day or so.  SYMPTOMS TO REPORT IMMEDIATELY:  Following lower endoscopy (colonoscopy or flexible sigmoidoscopy):  Excessive amounts of blood in the stool  Significant tenderness or worsening of abdominal pains  Swelling of the abdomen that is new, acute  Fever of 100F or higher    For urgent or emergent issues, a gastroenterologist can be reached at any hour by calling [phone]. Do not use MyChart messaging for urgent concerns.    DIET:  We do recommend a small meal at first, but then you may proceed to your regular diet.  Drink plenty of fluids but  you should avoid alcoholic beverages for 24 hours.  ACTIVITY:  You should plan to take it easy for the rest of today and you should NOT DRIVE or use heavy machinery until tomorrow (because of the sedation medicines used during the test).    FOLLOW UP: Our staff will call the number listed on your records the next business day following your procedure.  We will call around 7:15- 8:00 am to check on you and address any questions or concerns that you may have regarding the information given to you following your procedure. If we do not reach you, we will leave a message.  If you develop any symptoms (ie: fever, flu-like symptoms, shortness of breath, cough etc.) before then, please call [phone].  If you test positive for Covid 19 in the 2 weeks post procedure, please call and report this information to [name].    If any biopsies were taken you will be contacted by phone or by letter within the next 1-3 weeks.  Please call us at [phone] if you have not heard about the biopsies in 3 weeks.    SIGNATURES/CONFIDENTIALITY: You and/or your care partner have signed paperwork which will be entered into your electronic medical record.  These signatures attest to the fact that that the information above on your After Visit Summary has been reviewed and is understood.  Full responsibility of the confidentiality of this discharge information lies with you and/or your care-partner.

## 2022-04-17 ENCOUNTER — Telehealth: Payer: Self-pay

## 2022-04-17 NOTE — Telephone Encounter (Signed)
Follow up call placed, VM obtained and message left. ?SChaplin, RN,BSN ? ?

## 2022-04-20 ENCOUNTER — Encounter: Payer: No Typology Code available for payment source | Admitting: Physical Therapy

## 2022-04-23 ENCOUNTER — Encounter: Payer: Self-pay | Admitting: General Practice

## 2022-04-23 ENCOUNTER — Encounter: Payer: No Typology Code available for payment source | Admitting: Physical Therapy

## 2022-05-06 ENCOUNTER — Other Ambulatory Visit (HOSPITAL_BASED_OUTPATIENT_CLINIC_OR_DEPARTMENT_OTHER): Payer: Self-pay

## 2022-05-13 ENCOUNTER — Telehealth: Payer: Self-pay | Admitting: Family

## 2022-05-13 ENCOUNTER — Other Ambulatory Visit (HOSPITAL_BASED_OUTPATIENT_CLINIC_OR_DEPARTMENT_OTHER): Payer: Self-pay

## 2022-05-13 MED ORDER — WEGOVY 2.4 MG/0.75ML ~~LOC~~ SOAJ
2.4000 mg | SUBCUTANEOUS | 1 refills | Status: DC
  Filled 2022-05-13: qty 3, 28d supply, fill #0
  Filled 2022-06-05: qty 3, 28d supply, fill #1

## 2022-05-13 NOTE — Telephone Encounter (Signed)
[name], RN  [name], NP Good Morning!   Well, I've been on the [facility] for almost 7 weeks. ('1mg'$  x 4 weeks and 1.'7mg'$  x 3 weeks)  I have gained and lost the same 3 lbs since starting.  What are your thoughts on increasing to 2.'4mg'$ ?   Emily [name]

## 2022-05-29 ENCOUNTER — Encounter: Payer: Self-pay | Admitting: Plastic Surgery

## 2022-05-29 ENCOUNTER — Ambulatory Visit (INDEPENDENT_AMBULATORY_CARE_PROVIDER_SITE_OTHER): Payer: Self-pay | Admitting: Plastic Surgery

## 2022-05-29 DIAGNOSIS — Z411 Encounter for cosmetic surgery: Secondary | ICD-10-CM

## 2022-05-29 NOTE — Progress Notes (Signed)
Botulinum Toxin  Procedure: Cosmetic botulinum toxin  Pre-operative Diagnosis: Dynamic rhytides and midface volume loss  Post-operative Diagnosis: Same  Complications:  None  Brief history: The patient desires botulinum toxin injection of her forehead. I discussed with the patient this proposed procedure of botulinum toxin injections, which is customized depending on the particular needs of the patient. It is performed on facial rhytids as a temporary correction. The alternatives were discussed with the patient. The risks were addressed including bleeding, scarring, infection, damage to deeper structures, asymmetry, and chronic pain, which may occur infrequently after a procedure. The individual's choice to undergo a surgical procedure is based on the comparison of risks to potential benefits. Other risks include unsatisfactory results, brow ptosis, eyelid ptosis, allergic reaction, temporary paralysis, which should go away with time, bruising, blurring disturbances and delayed healing. Botulinum toxin injections do not arrest the aging process or produce permanent tightening of the eyelid.  Operative intervention maybe necessary to maintain the results of a blepharoplasty or botulinum toxin. The patient understands and wishes to proceed.  Procedure: The area was prepped with alcohol and dried with a clean gauze. Using a clean technique, the botulinum toxin was diluted with 2.5 cc of preservative-free normal saline which was slowly injected with an 18 gauge needle in a tuberculin syringes.  A 32 gauge needles were then used to inject the botulinum toxin. This mixture allow for an aliquot of 4 units per 0.1 cc in each injection site.    Subsequently the mixture was injected into the following regions: 10 U of botox was injected into the forehead. 15 U of botox was injected into the glabella. 10 U of botox was injected into the crows feet.  Botox LOT:  [id number]   EXP:  06/2024

## 2022-06-05 ENCOUNTER — Other Ambulatory Visit (HOSPITAL_BASED_OUTPATIENT_CLINIC_OR_DEPARTMENT_OTHER): Payer: Self-pay

## 2022-06-09 ENCOUNTER — Other Ambulatory Visit (HOSPITAL_BASED_OUTPATIENT_CLINIC_OR_DEPARTMENT_OTHER): Payer: Self-pay

## 2022-06-19 ENCOUNTER — Other Ambulatory Visit (HOSPITAL_BASED_OUTPATIENT_CLINIC_OR_DEPARTMENT_OTHER): Payer: Self-pay

## 2022-06-19 ENCOUNTER — Ambulatory Visit (INDEPENDENT_AMBULATORY_CARE_PROVIDER_SITE_OTHER): Payer: No Typology Code available for payment source | Admitting: Family

## 2022-06-19 ENCOUNTER — Encounter: Payer: Self-pay | Admitting: Family

## 2022-06-19 VITALS — BP 118/65 | HR 83 | Temp 96.5°F | Resp 18 | Ht 64.0 in | Wt 205.0 lb

## 2022-06-19 DIAGNOSIS — F3289 Other specified depressive episodes: Secondary | ICD-10-CM | POA: Diagnosis not present

## 2022-06-19 DIAGNOSIS — L219 Seborrheic dermatitis, unspecified: Secondary | ICD-10-CM

## 2022-06-19 DIAGNOSIS — E669 Obesity, unspecified: Secondary | ICD-10-CM | POA: Diagnosis not present

## 2022-06-19 DIAGNOSIS — E538 Deficiency of other specified B group vitamins: Secondary | ICD-10-CM

## 2022-06-19 DIAGNOSIS — Z0000 Encounter for general adult medical examination without abnormal findings: Secondary | ICD-10-CM | POA: Diagnosis not present

## 2022-06-19 DIAGNOSIS — D509 Iron deficiency anemia, unspecified: Secondary | ICD-10-CM

## 2022-06-19 LAB — CBC WITH DIFFERENTIAL/PLATELET
Basophils Absolute: 0 10*3/uL (ref 0.0–0.1)
Basophils Relative: 0.7 % (ref 0.0–3.0)
Eosinophils Absolute: 0.1 10*3/uL (ref 0.0–0.7)
Eosinophils Relative: 2.5 % (ref 0.0–5.0)
HCT: 34.8 % — ABNORMAL LOW (ref 36.0–46.0)
Hemoglobin: 11.1 g/dL — ABNORMAL LOW (ref 12.0–15.0)
Lymphocytes Relative: 39.3 % (ref 12.0–46.0)
Lymphs Abs: 1.9 10*3/uL (ref 0.7–4.0)
MCHC: 31.9 g/dL (ref 30.0–36.0)
MCV: 77.7 fl — ABNORMAL LOW (ref 78.0–100.0)
Monocytes Absolute: 0.3 10*3/uL (ref 0.1–1.0)
Monocytes Relative: 5.6 % (ref 3.0–12.0)
Neutro Abs: 2.5 10*3/uL (ref 1.4–7.7)
Neutrophils Relative %: 51.9 % (ref 43.0–77.0)
Platelets: 339 10*3/uL (ref 150.0–400.0)
RBC: 4.49 Mil/uL (ref 3.87–5.11)
RDW: 15.1 % (ref 11.5–15.5)
WBC: 4.9 10*3/uL (ref 4.0–10.5)

## 2022-06-19 MED ORDER — WEGOVY 2.4 MG/0.75ML ~~LOC~~ SOAJ
2.4000 mg | SUBCUTANEOUS | 2 refills | Status: DC
  Filled 2022-06-19 – 2022-07-01 (×2): qty 3, 28d supply, fill #0
  Filled 2022-07-30: qty 3, 28d supply, fill #1
  Filled 2022-08-25: qty 3, 28d supply, fill #2

## 2022-06-19 MED ORDER — CLOBETASOL PROPIONATE 0.05 % EX SOLN
1.0000 | Freq: Two times a day (BID) | CUTANEOUS | 1 refills | Status: DC
  Filled 2022-06-19: qty 50, 25d supply, fill #0
  Filled 2022-09-21: qty 50, 25d supply, fill #1

## 2022-06-19 NOTE — Assessment & Plan Note (Signed)
Will continue ZQJSID. She hopes to be able to go back on Mounjaro once it is officially approved for weight loss.

## 2022-06-19 NOTE — Progress Notes (Signed)
Subjective:   By signing my name below, I, Emily [name], attest that this documentation has been prepared under the direction and in the presence of [place], NP 06/19/2022   Patient ID: Emily [name], female    DOB: 1976-02-22, 46 y.o.   MRN: [id number]  Chief Complaint  Patient presents with   Annual Exam    HPI Patient is in today for a comprehensive physical exam  Refills: She is requesting a refill of 0.05% of Temovate and 2.4 Mg of Wegovy.  Wegovy: She is currently taking 2.4 Mg of Wegovy. She does not believe the medication is decreasing her weight. She is interested in continuing the medication.  Wt Readings from Last 3 Encounters:  06/19/22 205 lb (93 kg)  04/16/22 205 lb (93 kg)  03/19/22 205 lb (93 kg)   Mood: She is currently taking 150 Mg of Wellbutrin XL.  Vitamin B-12 Injections: She is not regularly taking her Vitamin B-12 injections.  Left Foot: She denies of any pain in her left foot. She does have tightness around the band of her foot and numbness in her toes. She regularly does exercises to improve her symptoms Iron: Her iron levels are below normal. She takes 325 Mg of Fe tablets of every other day.  Lab Results  Component Value Date   IRON 81 02/18/2022   TIBC 458 (H) 02/18/2022   FERRITIN 7.0 (L) 02/18/2022   Vitamin D: She is not taking a Vitamin D supplement. Her levels are normal.  Thyroid: Her thyroid levels are normal Lab Results  Component Value Date   TSH 3.23 07/15/2021   T3TOTAL 102 01/26/2017   A1C: Her A1C levels are normal.  Lab Results  Component Value Date   HGBA1C 5.1 07/17/2021   Vision: She believes that her vision is changing.   She denies having any fever, new muscle pain, joint pain , new moles, rashes, congestion, sinus pain, sore throat, palpations, cough, SOB ,wheezing,n/v/d constipation, blood in stool, dysuria, frequency, hematuria, depression, anxiety, headaches at this time  Social history: She reports  of a tendon repair surgery in her left foot. She denies of any changes to her family medical history.  Colonoscopy: Last completed on 04/16/2022 Dexa/PSA:  Pap Smear: Last completed on 06/23/2021. She is scheduled for a pap smear in 07/2022 Mammogram: Last completed on 08/12/2021 Immunizations: She is UTD on Tdap.  Exercise: She is not regularly exercising. She has not been back on her routine since the tear of her tendon in her left foot.    Health Maintenance Due  Topic Date Due   INFLUENZA VACCINE  05/05/2022    Past Medical History:  Diagnosis Date   B12 deficiency    Benign paroxysmal positional vertigo of right ear 07/15/2021   Depression    Heart murmur    History of COVID-19 2020   per pt mild symptoms that resolved   Hypermetropia of both eyes 09/12/2014   IDA (iron deficiency anemia)    Impingement syndrome of right shoulder 05/21/2015   Impingement syndrome of right shoulder 05/21/2015   Lactose intolerance    per pt due to gastric bypass   Morbid obesity ([place]) 08/15/2017   Nevus of choroid of left eye 09/12/2014   being monitored   Palpitations    due ot SVT   Psoriasis    Seborrheic dermatitis 02/08/2015   Status post gastric bypass for obesity 12/08/2016   SVT (supraventricular tachycardia) [facility] [place])    cardiologist--- dr Edyth Gunnels;  normal echo in epic 02-17-2021 and event monitor 02-04-2021  symptomatic SVT w/ 5 brief episodes the longest 14 beats   Vitamin D deficiency    Wears glasses     Past Surgical History:  Procedure Laterality Date   COLPOSCOPY  2008   ENDOMETRIAL BIOPSY  2017   negative per pt   FOOT SURGERY Left 12/17/2021   LAPAROSCOPIC CHOLECYSTECTOMY  2006   MASS EXCISION Left 08/17/2017   Procedure: EXCISION SOFT TISSUE  MASS LEFT FLANK;  Surgeon: [name], MD;  Location: [facility];  Service: General;  Laterality: Left;   ROUX-EN-Y GASTRIC BYPASS  2007   TENDON REPAIR Left 12/17/2021   Procedure: TENDON REPAIR OF LEFT  FOOT;  Surgeon: Trula Slade, DPM;  Location: [facility];  Service: Podiatry;  Laterality: Left;  GENERAL WITH BLOCK    Family History  Problem Relation Age of Onset   Hypertension Mother    Obesity Mother    Hypertension Father    Sleep apnea Father    Obesity Father    Colon polyps Father    Alzheimer's disease Maternal Grandmother    Leukemia Maternal Grandfather    Alzheimer's disease Paternal Grandmother    Cancer Paternal Grandfather    Cancer - Other Paternal Grandfather    Diabetes type II Maternal Aunt    Stroke Maternal Aunt    Multiple sclerosis Maternal Aunt    Heart attack Paternal Aunt    Colon cancer Neg Hx    Rectal cancer Neg Hx    Stomach cancer Neg Hx    Esophageal cancer Neg Hx     Social History   Socioeconomic History   Marital status: Single    Spouse name: Not on file   Number of children: Not on file   Years of education: Not on file   Highest education level: Not on file  Occupational History   Occupation: Programmer, multimedia: [employer]  Tobacco Use   Smoking status: Never    Passive exposure: Never   Smokeless tobacco: Never  Vaping Use   Vaping Use: Never used  Substance and Sexual Activity   Alcohol use: Yes    Alcohol/week: 3.0 standard drinks of alcohol    Types: 3 Glasses of wine per week   Drug use: Never   Sexual activity: Yes    Birth control/protection: Pill  Other Topics Concern   Not on file  Social History Narrative   Works as an Therapist, sports for [employer]   No children   Not married   Has a dog named Water quality scientist   From [place]- moved to [place] in 2002   Parents are both living   Has one sister back home   Enjoys going out to eat with friends   Enjoys biking and kayaking.    Goes to the GYM 6 days a week   Social Determinants of Radio broadcast assistant Strain: Not on file  Food Insecurity: Not on file  Transportation Needs: Not on file  Physical Activity: Not on file  Stress: Not on file   Social Connections: Not on file  Intimate Partner Violence: Not on file    Outpatient Medications Prior to Visit  Medication Sig Dispense Refill   B Complex-C (SUPER B COMPLEX PO) Take by mouth.     Barberry-Oreg Grape-Goldenseal (BERBERINE COMPLEX PO) Take by mouth.     buPROPion (WELLBUTRIN XL) 150 MG 24 hr tablet TAKE 1 TABLET (  150 MG TOTAL) BY MOUTH DAILY. 90 tablet 1   cetirizine (ZYRTEC) 10 MG tablet Take 10 mg by mouth daily.     cyanocobalamin (,VITAMIN B-12,) 1000 MCG/ML injection Inject 1 mL (1,000 mcg total) into the muscle once a week. (Patient taking differently: Inject 1,000 mcg into the muscle once a week. Sunday's) 12 mL 2   diltiazem (CARDIZEM CD) 180 MG 24 hr capsule Take 1 capsule (180 mg total) by mouth daily. (Patient taking differently: Take 180 mg by mouth daily.) 90 capsule 3   docusate sodium (COLACE) 100 MG capsule Take 100 mg by mouth every other day.     Ferrous Sulfate (IRON) 325 (65 Fe) MG TABS Take 1 tablet (325 mg total) by mouth every other day. 30 tablet 0   folic acid (FOLVITE) 1 MG tablet Take 1 mg by mouth daily.     gabapentin (NEURONTIN) 100 MG capsule Take 1 capsule (100 mg total) by mouth 3 (three) times daily. 90 capsule 2   ketoconazole (NIZORAL) 2 % shampoo Apply 1 application. topically daily.     norethindrone-ethinyl estradiol-iron (MICROGESTIN FE 1.5/30) 1.5-30 MG-MCG tablet Take 1 tablet by mouth daily. (Patient taking differently: Take 1 tablet by mouth daily.) 84 tablet 4   clobetasol (TEMOVATE) 0.05 % external solution Apply 1 application topically 2 (two) times daily. 50 mL 1   Semaglutide-Weight Management (WEGOVY) 2.4 MG/0.75ML SOAJ Inject 2.4 mg into the skin once a week. 3 mL 1   No facility-administered medications prior to visit.    Allergies  Allergen Reactions   Almond Oil Swelling   Apple Juice Swelling   Kiwi Extract Swelling   Soy Allergy Itching    Mouth/ tongue itch   Strawberry Extract Itching    Mouth/ tongue itch     Review of Systems  Constitutional:  Negative for fever.  HENT:  Negative for congestion, sinus pain and sore throat.   Respiratory:  Negative for cough, shortness of breath and wheezing.   Cardiovascular:  Negative for palpitations.  Gastrointestinal:  Negative for blood in stool, constipation, diarrhea, nausea and vomiting.  Genitourinary:  Negative for dysuria, frequency and hematuria.  Musculoskeletal:  Negative for joint pain and myalgias.  Skin:  Negative for rash.       (-) [place]  Neurological:  Negative for headaches.  Psychiatric/Behavioral:  Negative for depression. The patient is not nervous/anxious.        Objective:    Physical Exam Constitutional:      General: She is not in acute distress.    Appearance: Normal appearance. She is not ill-appearing.  HENT:     Head: Normocephalic and atraumatic.     Right Ear: Tympanic membrane, ear canal and external ear normal.     Left Ear: Tympanic membrane, ear canal and external ear normal.  Eyes:     Extraocular Movements: Extraocular movements intact.     Pupils: Pupils are equal, round, and reactive to light.  Neck:     Thyroid: No thyromegaly.  Cardiovascular:     Rate and Rhythm: Normal rate and regular rhythm.     Heart sounds: Normal heart sounds. No murmur heard.    No gallop.  Pulmonary:     Effort: Pulmonary effort is normal. No respiratory distress.     Breath sounds: Normal breath sounds. No wheezing or rales.  Abdominal:     General: Bowel sounds are normal. There is no distension.     Palpations: Abdomen is soft.  Tenderness: There is no abdominal tenderness. There is no guarding.  Musculoskeletal:     Comments: 5/5 strength in both upper and lower extremities    Lymphadenopathy:     Cervical: No cervical adenopathy.  Skin:    General: Skin is warm and dry.  Neurological:     Mental Status: She is alert and oriented to person, place, and time.     Deep Tendon Reflexes:     Reflex  Scores:      Patellar reflexes are 2+ on the right side and 2+ on the left side. Psychiatric:        Mood and Affect: Mood normal.        Behavior: Behavior normal.        Judgment: Judgment normal.     BP 118/65   Pulse 83   Temp (!) 96.5 F (35.8 C) (Temporal)   Resp 18   Ht '5\' 4"'$  (1.626 m)   Wt 205 lb (93 kg)   SpO2 100%   BMI 35.19 kg/m  Wt Readings from Last 3 Encounters:  06/19/22 205 lb (93 kg)  04/16/22 205 lb (93 kg)  03/19/22 205 lb (93 kg)       Assessment & Plan:   Problem List Items Addressed This Visit       Unprioritized   Vitamin B 12 deficiency    Restart b12 injections.      Seborrheic dermatitis    Stable, continues Temovate solution for scalp.       Preventative health care    Discussed healthy diet, exercise.  Mammo/pap/colo up to date. She will get flu shot at work. Plans to get covid booster when available. Tetanus up to date.       Obesity (BMI 35.0-39.9 without comorbidity)    Will continue JTTSVX. She hopes to be able to go back on Mounjaro once it is officially approved for weight loss.       Relevant Medications   Semaglutide-Weight Management (WEGOVY) 2.4 MG/0.75ML SOAJ   Depression    Maintained on wellbutrin. Mood is stable.       B12 deficiency    Not consistent at home.  Will restart.       Anemia - Primary    She had a negative colonoscopy, but I still encouraged her to complete her IFOB.  Continue iron '325mg'$  QOD.       Relevant Orders   Iron, TIBC and Ferritin Panel   CBC with Differential/Platelet   Meds ordered this encounter  Medications   clobetasol (TEMOVATE) 0.05 % external solution    Sig: Apply 1 application topically 2 (two) times daily.    Dispense:  50 mL    Refill:  1    Order Specific Question:   Supervising Provider    Answer:   Penni Homans A [[id number]]   Semaglutide-Weight Management (WEGOVY) 2.4 MG/0.75ML SOAJ    Sig: Inject 2.4 mg into the skin once a week.    Dispense:  3 mL    Refill:  2     Order Specific Question:   Supervising Provider    Answer:   Penni Homans A [[id number]]    I, Nance Pear, NP, personally preformed the services described in this documentation.  All medical record entries made by the scribe were at my direction and in my presence.  I have reviewed the chart and discharge instructions (if applicable) and agree that the record reflects my personal performance and is accurate and complete.  06/19/2022   I,Amber Collins,acting as a scribe for Nance Pear, NP.,have documented all relevant documentation on the behalf of Nance Pear, NP,as directed by  Nance Pear, NP while in the presence of Nance Pear, NP.   Nance Pear, NP

## 2022-06-19 NOTE — Assessment & Plan Note (Signed)
Maintained on wellbutrin. Mood is stable.

## 2022-06-19 NOTE — Assessment & Plan Note (Signed)
She had a negative colonoscopy, but I still encouraged her to complete her IFOB.  Continue iron '325mg'$  QOD.

## 2022-06-19 NOTE — Assessment & Plan Note (Signed)
Discussed healthy diet, exercise.  Mammo/pap/colo up to date. She will get flu shot at work. Plans to get covid booster when available. Tetanus up to date.

## 2022-06-19 NOTE — Assessment & Plan Note (Signed)
Not consistent at home.  Will restart.

## 2022-06-19 NOTE — Assessment & Plan Note (Signed)
Restart b12 injections.  

## 2022-06-19 NOTE — Assessment & Plan Note (Signed)
Stable, continues Temovate solution for scalp.

## 2022-06-20 LAB — IRON,TIBC AND FERRITIN PANEL
%SAT: 9 % (calc) — ABNORMAL LOW (ref 16–45)
Ferritin: 4 ng/mL — ABNORMAL LOW (ref 16–232)
Iron: 42 ug/dL (ref 40–190)
TIBC: 457 mcg/dL (calc) — ABNORMAL HIGH (ref 250–450)

## 2022-06-22 ENCOUNTER — Telehealth: Payer: Self-pay | Admitting: Family

## 2022-06-22 DIAGNOSIS — D509 Iron deficiency anemia, unspecified: Secondary | ICD-10-CM

## 2022-06-22 NOTE — Telephone Encounter (Signed)
See mychart.  

## 2022-07-01 ENCOUNTER — Other Ambulatory Visit (HOSPITAL_BASED_OUTPATIENT_CLINIC_OR_DEPARTMENT_OTHER): Payer: Self-pay

## 2022-07-02 ENCOUNTER — Other Ambulatory Visit (HOSPITAL_BASED_OUTPATIENT_CLINIC_OR_DEPARTMENT_OTHER): Payer: Self-pay

## 2022-07-14 ENCOUNTER — Telehealth: Payer: Self-pay | Admitting: Family

## 2022-07-14 DIAGNOSIS — D649 Anemia, unspecified: Secondary | ICD-10-CM

## 2022-07-14 NOTE — Telephone Encounter (Signed)
See mychart.  

## 2022-07-29 ENCOUNTER — Other Ambulatory Visit (HOSPITAL_BASED_OUTPATIENT_CLINIC_OR_DEPARTMENT_OTHER): Payer: Self-pay

## 2022-07-29 ENCOUNTER — Other Ambulatory Visit: Payer: Self-pay | Admitting: Cardiology

## 2022-07-29 MED ORDER — DILTIAZEM HCL ER COATED BEADS 180 MG PO CP24
180.0000 mg | ORAL_CAPSULE | Freq: Every day | ORAL | 0 refills | Status: DC
  Filled 2022-07-29 – 2022-07-30 (×2): qty 90, 90d supply, fill #0

## 2022-07-30 ENCOUNTER — Other Ambulatory Visit (HOSPITAL_BASED_OUTPATIENT_CLINIC_OR_DEPARTMENT_OTHER): Payer: Self-pay

## 2022-08-03 ENCOUNTER — Ambulatory Visit (INDEPENDENT_AMBULATORY_CARE_PROVIDER_SITE_OTHER): Payer: No Typology Code available for payment source | Admitting: Obstetrics and Gynecology

## 2022-08-03 ENCOUNTER — Encounter: Payer: Self-pay | Admitting: Obstetrics and Gynecology

## 2022-08-03 VITALS — BP 112/50 | HR 78 | Ht 64.0 in | Wt 205.0 lb

## 2022-08-03 DIAGNOSIS — Z01419 Encounter for gynecological examination (general) (routine) without abnormal findings: Secondary | ICD-10-CM | POA: Diagnosis not present

## 2022-08-03 NOTE — Progress Notes (Signed)
ANNUAL EXAM Patient name: Emily [name] MRN [id number]  Date of birth: 1976/04/23 Chief Complaint:   Gynecologic Exam  History of Present Illness:   Emily [name] is a 46 y.o. G0P0000 being seen today for a routine annual exam.  No complaints. Continues to use OCPs, no menstrual issues. No issues with voiding, BM, or intercourse. Breast exams at home, no issues noted.   Patient's last menstrual period was 07/04/2022 (approximate).   The pregnancy intention screening data noted above was reviewed. Potential methods of contraception were discussed. The patient elected to continue OCPs  Last pap 06/2021. Results were: NILM w/ HRHPV negative.  Last mammogram: 08/2021. Results were: normal.  Last colonoscopy: 04/2022. Results were: normal.      12/03/2021    7:30 AM 11/20/2020    8:12 AM 08/13/2017    4:38 PM 09/21/2016    2:27 PM  Depression screen PHQ 2/9  Decreased Interest 0 0 0 1  Down, Depressed, Hopeless 0 1 0 1  PHQ - 2 Score 0 1 0 2  Altered sleeping  0 0 0  Tired, decreased energy  0 0 1  Change in appetite  0 0 1  Feeling bad or failure about yourself   0 0 2  Trouble concentrating  0 0 0  Moving slowly or fidgety/restless  0 0 1  Suicidal thoughts  0 0 0  PHQ-9 Score  1 0 7  Difficult doing work/chores  Somewhat difficult          11/20/2020    8:12 AM  GAD 7 : Generalized Anxiety Score  Nervous, Anxious, on Edge 0  Control/stop worrying 0  Worry too much - different things 0  Trouble relaxing 0  Restless 0  Easily annoyed or irritable 3  Afraid - awful might happen 0  Total GAD 7 Score 3  Anxiety Difficulty Somewhat difficult     Review of Systems:   Pertinent items are noted in HPI Denies any headaches, blurred vision, fatigue, shortness of breath, chest pain, abdominal pain, abnormal vaginal discharge/itching/odor/irritation, problems with periods, bowel movements, urination, or intercourse unless otherwise stated above. Pertinent History Reviewed:   Reviewed past medical,surgical, social and family history.  Reviewed problem list, medications and allergies. Physical Assessment:   Vitals:   08/03/22 0808  BP: (!) 112/50  Pulse: 78  Weight: 205 lb (93 kg)  Height: '5\' 4"'$  (1.626 m)  Body mass index is 35.19 kg/m.        Physical Examination:   General appearance - well appearing, and in no distress  Mental status - alert, oriented to person, place, and time  Psych:  She has a normal mood and affect  Skin - warm and dry, normal color, no suspicious lesions noted  Chest - effort normal, all lung fields clear to auscultation bilaterally  Heart - normal rate and regular rhythm  Breasts - breasts appear normal, no suspicious masses, no skin or nipple changes or  axillary nodes  Abdomen - soft, nontender, nondistended, no masses or organomegaly  Pelvic - VULVA: normal appearing vulva with no masses, tenderness or lesions  VAGINA: normal appearing vagina with normal color and discharge, no lesions    UTERUS: uterus is felt to be normal size, shape, consistency and nontender   ADNEXA: No adnexal masses or tenderness noted.  Extremities:  No swelling or varicosities noted  Chaperone present for exam  No results found for this or any previous visit (from the past 24 hour(s)).  Assessment &  Plan:  1. Well woman exam with routine gynecological exam Continued OCPs at this time Pap due 2025-2027 Mammo due 2024 Overall doing well, routine exam completed today     Meds: No orders of the defined types were placed in this encounter.   Follow-up: Return for Annual GYN.  Darliss Cheney, MD 08/03/2022 8:25 AM

## 2022-08-18 ENCOUNTER — Other Ambulatory Visit: Payer: Self-pay

## 2022-08-18 ENCOUNTER — Other Ambulatory Visit (HOSPITAL_BASED_OUTPATIENT_CLINIC_OR_DEPARTMENT_OTHER): Payer: Self-pay

## 2022-08-18 MED ORDER — GABAPENTIN 100 MG PO CAPS
100.0000 mg | ORAL_CAPSULE | Freq: Three times a day (TID) | ORAL | 1 refills | Status: DC
  Filled 2022-08-18: qty 90, 30d supply, fill #0
  Filled 2022-09-21: qty 90, 30d supply, fill #1

## 2022-08-20 ENCOUNTER — Telehealth (HOSPITAL_BASED_OUTPATIENT_CLINIC_OR_DEPARTMENT_OTHER): Payer: Self-pay

## 2022-08-24 ENCOUNTER — Ambulatory Visit (HOSPITAL_BASED_OUTPATIENT_CLINIC_OR_DEPARTMENT_OTHER): Payer: No Typology Code available for payment source

## 2022-08-25 ENCOUNTER — Other Ambulatory Visit (HOSPITAL_BASED_OUTPATIENT_CLINIC_OR_DEPARTMENT_OTHER): Payer: Self-pay

## 2022-08-31 ENCOUNTER — Encounter (HOSPITAL_BASED_OUTPATIENT_CLINIC_OR_DEPARTMENT_OTHER): Payer: Self-pay

## 2022-08-31 ENCOUNTER — Ambulatory Visit (HOSPITAL_BASED_OUTPATIENT_CLINIC_OR_DEPARTMENT_OTHER)
Admission: RE | Admit: 2022-08-31 | Discharge: 2022-08-31 | Disposition: A | Discharge status: Home / Self Care | Payer: No Typology Code available for payment source | Source: Ambulatory Visit | Attending: Obstetrics and Gynecology | Admitting: Obstetrics and Gynecology

## 2022-08-31 DIAGNOSIS — Z01419 Encounter for gynecological examination (general) (routine) without abnormal findings: Secondary | ICD-10-CM

## 2022-08-31 DIAGNOSIS — Z1231 Encounter for screening mammogram for malignant neoplasm of breast: Secondary | ICD-10-CM | POA: Insufficient documentation

## 2022-09-02 ENCOUNTER — Other Ambulatory Visit: Payer: Self-pay | Admitting: Obstetrics and Gynecology

## 2022-09-02 DIAGNOSIS — R928 Other abnormal and inconclusive findings on diagnostic imaging of breast: Secondary | ICD-10-CM

## 2022-09-04 ENCOUNTER — Other Ambulatory Visit: Payer: Self-pay

## 2022-09-07 ENCOUNTER — Other Ambulatory Visit: Payer: Self-pay | Admitting: Obstetrics and Gynecology

## 2022-09-07 ENCOUNTER — Other Ambulatory Visit (HOSPITAL_BASED_OUTPATIENT_CLINIC_OR_DEPARTMENT_OTHER): Payer: Self-pay

## 2022-09-07 ENCOUNTER — Encounter: Payer: Self-pay | Admitting: Obstetrics and Gynecology

## 2022-09-07 DIAGNOSIS — Z01419 Encounter for gynecological examination (general) (routine) without abnormal findings: Secondary | ICD-10-CM

## 2022-09-07 DIAGNOSIS — Z3041 Encounter for surveillance of contraceptive pills: Secondary | ICD-10-CM

## 2022-09-07 DIAGNOSIS — Z3009 Encounter for other general counseling and advice on contraception: Secondary | ICD-10-CM

## 2022-09-07 MED ORDER — NORETHIN ACE-ETH ESTRAD-FE 1.5-30 MG-MCG PO TABS
1.0000 | ORAL_TABLET | Freq: Every day | ORAL | 5 refills | Status: DC
  Filled 2022-09-07: qty 84, 63d supply, fill #0
  Filled 2022-11-19: qty 84, 63d supply, fill #1
  Filled 2023-01-15: qty 84, 63d supply, fill #2
  Filled 2023-03-19: qty 84, 63d supply, fill #3
  Filled 2023-05-21: qty 84, 63d supply, fill #4
  Filled 2023-07-21: qty 84, 63d supply, fill #5

## 2022-09-08 ENCOUNTER — Other Ambulatory Visit (HOSPITAL_BASED_OUTPATIENT_CLINIC_OR_DEPARTMENT_OTHER): Payer: Self-pay

## 2022-09-11 ENCOUNTER — Other Ambulatory Visit (HOSPITAL_BASED_OUTPATIENT_CLINIC_OR_DEPARTMENT_OTHER): Payer: Self-pay

## 2022-09-11 ENCOUNTER — Other Ambulatory Visit: Payer: Self-pay | Admitting: Family

## 2022-09-11 MED ORDER — WEGOVY 2.4 MG/0.75ML ~~LOC~~ SOAJ
2.4000 mg | SUBCUTANEOUS | 2 refills | Status: DC
  Filled 2022-09-11 – 2022-09-21 (×2): qty 3, 28d supply, fill #0
  Filled 2022-10-20: qty 3, 28d supply, fill #1

## 2022-09-16 ENCOUNTER — Ambulatory Visit
Admission: RE | Admit: 2022-09-16 | Discharge: 2022-09-16 | Disposition: A | Discharge status: Home / Self Care | Payer: No Typology Code available for payment source | Source: Ambulatory Visit | Attending: Obstetrics and Gynecology | Admitting: Obstetrics and Gynecology

## 2022-09-16 ENCOUNTER — Other Ambulatory Visit (HOSPITAL_BASED_OUTPATIENT_CLINIC_OR_DEPARTMENT_OTHER): Payer: Self-pay

## 2022-09-16 ENCOUNTER — Ambulatory Visit: Admission: RE | Admit: 2022-09-16 | Payer: No Typology Code available for payment source | Source: Ambulatory Visit

## 2022-09-16 DIAGNOSIS — R928 Other abnormal and inconclusive findings on diagnostic imaging of breast: Secondary | ICD-10-CM

## 2022-09-21 ENCOUNTER — Other Ambulatory Visit (HOSPITAL_BASED_OUTPATIENT_CLINIC_OR_DEPARTMENT_OTHER): Payer: Self-pay

## 2022-09-21 ENCOUNTER — Other Ambulatory Visit: Payer: Self-pay | Admitting: *Deleted

## 2022-09-21 MED ORDER — BUPROPION HCL ER (XL) 150 MG PO TB24
ORAL_TABLET | Freq: Every day | ORAL | 1 refills | Status: DC
  Filled 2022-09-21: qty 90, 90d supply, fill #0

## 2022-09-22 ENCOUNTER — Other Ambulatory Visit (HOSPITAL_BASED_OUTPATIENT_CLINIC_OR_DEPARTMENT_OTHER): Payer: Self-pay

## 2022-09-23 ENCOUNTER — Telehealth: Payer: Self-pay | Admitting: *Deleted

## 2022-09-23 ENCOUNTER — Other Ambulatory Visit: Payer: Self-pay

## 2022-09-23 ENCOUNTER — Other Ambulatory Visit (HOSPITAL_COMMUNITY): Payer: Self-pay

## 2022-09-23 ENCOUNTER — Other Ambulatory Visit (HOSPITAL_BASED_OUTPATIENT_CLINIC_OR_DEPARTMENT_OTHER): Payer: Self-pay

## 2022-09-23 NOTE — Telephone Encounter (Signed)
Prior auth started via cover my meds.  Awaiting determination.  Key: [id number]

## 2022-09-24 ENCOUNTER — Other Ambulatory Visit: Payer: Self-pay

## 2022-09-24 NOTE — Telephone Encounter (Signed)
Your prior authorization for Mancel Parsons has been approved! MORE INFO Personalized support and financial assistance may be available through the [employer] program. For more information, and to see program requirements, click on the More Info button to the right.  Message from plan: The request has been approved. The authorization is effective from 09/23/2022 to 09/23/2023, as long as the member is enrolled in their current health plan. The request was approved as submitted. The request was approved for 3 mL (four pens) per 28 days.Additional authorizations have been entered for the following:Wegovy 0.25 mg/0.5 mL, allowing 2 mL (four pens) per 28 days; please reference authorization [id number];Wegovy 0.5 mg/0.5 mL, allowing 2 mL (four pens) per 28 days; please reference authorization [id number];Wegovy 1 mg/0.5 mL, allowing 2 mL (four pens) per 28 days; please reference authorization [id number];Wegovy 1.7 mg/0.75 mL, allowing 3 mL (four pens) per 28 days; please reference authorization [id number];effective 09/23/2022 through 09/23/2023. A written notification letter will follow with additional details.

## 2022-09-25 ENCOUNTER — Other Ambulatory Visit (HOSPITAL_BASED_OUTPATIENT_CLINIC_OR_DEPARTMENT_OTHER): Payer: Self-pay

## 2022-10-06 ENCOUNTER — Other Ambulatory Visit (HOSPITAL_BASED_OUTPATIENT_CLINIC_OR_DEPARTMENT_OTHER): Payer: Self-pay

## 2022-10-06 ENCOUNTER — Telehealth: Payer: Self-pay | Admitting: Family

## 2022-10-06 ENCOUNTER — Other Ambulatory Visit (INDEPENDENT_AMBULATORY_CARE_PROVIDER_SITE_OTHER): Payer: 59

## 2022-10-06 DIAGNOSIS — R300 Dysuria: Secondary | ICD-10-CM

## 2022-10-06 MED ORDER — NITROFURANTOIN MONOHYD MACRO 100 MG PO CAPS
100.0000 mg | ORAL_CAPSULE | Freq: Two times a day (BID) | ORAL | 0 refills | Status: DC
  Filled 2022-10-06: qty 10, 5d supply, fill #0

## 2022-10-06 NOTE — Addendum Note (Signed)
Addended by: [name] on: 10/06/2022 03:23 PM   Modules accepted: Orders

## 2022-10-06 NOTE — Telephone Encounter (Signed)
Patient c/o dysuria.  She will leave UA and culture in the lab.

## 2022-10-07 LAB — URINALYSIS, ROUTINE W REFLEX MICROSCOPIC
Bilirubin Urine: NEGATIVE
Ketones, ur: NEGATIVE
Nitrite: NEGATIVE
Specific Gravity, Urine: >=1.03 — AB (ref 1.000–1.030)
Total Protein, Urine: 30 — AB
Urine Glucose: NEGATIVE
Urobilinogen, UA: 0.2 (ref 0.0–1.0)
pH: 6 (ref 5.0–8.0)

## 2022-10-08 LAB — URINE CULTURE
MICRO NUMBER:: 14377190
SPECIMEN QUALITY:: ADEQUATE

## 2022-10-20 ENCOUNTER — Ambulatory Visit (INDEPENDENT_AMBULATORY_CARE_PROVIDER_SITE_OTHER): Payer: 59 | Admitting: Family

## 2022-10-20 ENCOUNTER — Other Ambulatory Visit (HOSPITAL_BASED_OUTPATIENT_CLINIC_OR_DEPARTMENT_OTHER): Payer: Self-pay

## 2022-10-20 ENCOUNTER — Telehealth: Payer: Self-pay

## 2022-10-20 VITALS — BP 107/43 | HR 69 | Temp 97.6°F | Resp 16 | Wt 199.0 lb

## 2022-10-20 DIAGNOSIS — D509 Iron deficiency anemia, unspecified: Secondary | ICD-10-CM | POA: Diagnosis not present

## 2022-10-20 DIAGNOSIS — L989 Disorder of the skin and subcutaneous tissue, unspecified: Secondary | ICD-10-CM

## 2022-10-20 DIAGNOSIS — E66811 Obesity, class 1: Secondary | ICD-10-CM

## 2022-10-20 DIAGNOSIS — E669 Obesity, unspecified: Secondary | ICD-10-CM | POA: Diagnosis not present

## 2022-10-20 DIAGNOSIS — F3289 Other specified depressive episodes: Secondary | ICD-10-CM

## 2022-10-20 DIAGNOSIS — E538 Deficiency of other specified B group vitamins: Secondary | ICD-10-CM | POA: Diagnosis not present

## 2022-10-20 DIAGNOSIS — G8929 Other chronic pain: Secondary | ICD-10-CM

## 2022-10-20 DIAGNOSIS — M79673 Pain in unspecified foot: Secondary | ICD-10-CM

## 2022-10-20 LAB — CBC WITH DIFFERENTIAL/PLATELET
Basophils Absolute: 0 10*3/uL (ref 0.0–0.1)
Basophils Relative: 0.7 % (ref 0.0–3.0)
Eosinophils Absolute: 0.1 10*3/uL (ref 0.0–0.7)
Eosinophils Relative: 1.7 % (ref 0.0–5.0)
HCT: 34.3 % — ABNORMAL LOW (ref 36.0–46.0)
Hemoglobin: 10.9 g/dL — ABNORMAL LOW (ref 12.0–15.0)
Lymphocytes Relative: 34.7 % (ref 12.0–46.0)
Lymphs Abs: 2.2 10*3/uL (ref 0.7–4.0)
MCHC: 31.8 g/dL (ref 30.0–36.0)
MCV: 76.4 fl — ABNORMAL LOW (ref 78.0–100.0)
Monocytes Absolute: 0.3 10*3/uL (ref 0.1–1.0)
Monocytes Relative: 4.9 % (ref 3.0–12.0)
Neutro Abs: 3.7 10*3/uL (ref 1.4–7.7)
Neutrophils Relative %: 58 % (ref 43.0–77.0)
Platelets: 467 10*3/uL — ABNORMAL HIGH (ref 150.0–400.0)
RBC: 4.49 Mil/uL (ref 3.87–5.11)
RDW: 16 % — ABNORMAL HIGH (ref 11.5–15.5)
WBC: 6.4 10*3/uL (ref 4.0–10.5)

## 2022-10-20 LAB — VITAMIN B12: Vitamin B-12: 290 pg/mL (ref 211–911)

## 2022-10-20 MED ORDER — ZEPBOUND 7.5 MG/0.5ML ~~LOC~~ SOAJ
7.5000 mg | SUBCUTANEOUS | 1 refills | Status: DC
  Filled 2022-10-20: qty 2, 28d supply, fill #0

## 2022-10-20 NOTE — Assessment & Plan Note (Signed)
She is currently taking gabapentin bid which she finds helpful for this chronic pain that she has experienced following her foot surgery.

## 2022-10-20 NOTE — Progress Notes (Signed)
Subjective:   By signing my name below, I, Eugene Gavia, attest that this documentation has been prepared under the direction and in the presence of Nance Pear, NP 10/20/22   Patient ID: Emily [name], female    DOB: January 01, 1976, 47 y.o.   MRN: [id number]  Chief Complaint  Patient presents with   Skin lesion    Complains of skin lesion on right side of face    HPI Patient is in today for evaluation of a skin lesion.  Skin lesion: She reports having a patch of dark discoloration on the right side of her face. She suspected that this was an age spot similar to what she has on her left cheek. However, in the last x1 week the spot has become red and painful.  Iron deficiency anemia: H/o of gastric bypass. She has not yet provided a stool sample and plans to get another kit to do this. She takes po iron supplementation every other day. Lab Results  Component Value Date   IRON 42 06/19/2022   TIBC 457 (H) 06/19/2022   FERRITIN 4 (L) 06/19/2022   B12 deficiency: She has been taking a B complex daily. She uses B12 injections irregularly. Lab Results  Component Value Date   VITAMINB12 225 09/15/2021    Weight management: She states that she has not lost any weight with Wegovy despite her efforts. She has been on the highest dose for the last 3 months. Wt Readings from Last 5 Encounters:  10/20/22 199 lb (90.3 kg)  08/03/22 205 lb (93 kg)  06/19/22 205 lb (93 kg)  04/16/22 205 lb (93 kg)  03/19/22 205 lb (93 kg)    Depression: She states that she does not feel that she needs the Wellbutrin anymore. Her mood has been really good.  Left foot numbness: She reports chronic left foot numbness/tingling and occasional pain in her foot s/p surgery in 12/2021. The symptoms are controlled with Gabapentin.  Health maintenance: She is UTD on her mammograms. She denies having any fever, new muscle pain, new joint pain, congestion, sinus pain, sore throat, chest pain, palpitations,  cough, SOB, wheezing, n/v/d, constipation, blood in stool, dysuria, frequency, hematuria, or headaches at this time.  Patient denies any concern for depression or anxiety. She denies any new family history or surgical history since her last visit with me.  Immunizations: Patient is due for COVID vaccine.  Diet: Well balanced  Exercise: She is exercising on a walking pas 3 days a week at home.    Past Medical History:  Diagnosis Date   B12 deficiency    Benign paroxysmal positional vertigo of right ear 07/15/2021   Depression    Heart murmur    History of COVID-19 2020   per pt mild symptoms that resolved   Hypermetropia of both eyes 09/12/2014   IDA (iron deficiency anemia)    Impingement syndrome of right shoulder 05/21/2015   Impingement syndrome of right shoulder 05/21/2015   Lactose intolerance    per pt due to gastric bypass   Morbid obesity ([place]) 08/15/2017   Nevus of choroid of left eye 09/12/2014   being monitored   Palpitations    due ot SVT   Psoriasis    Seborrheic dermatitis 02/08/2015   Status post gastric bypass for obesity 12/08/2016   SVT (supraventricular tachycardia)    cardiologist--- dr Edyth Gunnels;    normal echo in epic 02-17-2021 and event monitor 02-04-2021  symptomatic SVT w/ 5 brief episodes the longest 14  beats   Vitamin D deficiency    Wears glasses     Past Surgical History:  Procedure Laterality Date   COLPOSCOPY  2008   ENDOMETRIAL BIOPSY  2017   negative per pt   FOOT SURGERY Left 12/17/2021   LAPAROSCOPIC CHOLECYSTECTOMY  2006   MASS EXCISION Left 08/17/2017   Procedure: EXCISION SOFT TISSUE  MASS LEFT FLANK;  Surgeon: [name], MD;  Location: [facility];  Service: General;  Laterality: Left;   ROUX-EN-Y GASTRIC BYPASS  2007   TENDON REPAIR Left 12/17/2021   Procedure: TENDON REPAIR OF LEFT FOOT;  Surgeon: Trula Slade, DPM;  Location: [facility];  Service: Podiatry;  Laterality: Left;  GENERAL  WITH BLOCK    Family History  Problem Relation Age of Onset   Hypertension Mother    Obesity Mother    Hypertension Father    Sleep apnea Father    Obesity Father    Colon polyps Father    Alzheimer's disease Maternal Grandmother    Leukemia Maternal Grandfather    Alzheimer's disease Paternal Grandmother    Cancer Paternal Grandfather    Cancer - Other Paternal Grandfather    Diabetes type II Maternal Aunt    Stroke Maternal Aunt    Multiple sclerosis Maternal Aunt    Heart attack Paternal Aunt    Colon cancer Neg Hx    Rectal cancer Neg Hx    Stomach cancer Neg Hx    Esophageal cancer Neg Hx     Social History   Socioeconomic History   Marital status: Single    Spouse name: Not on file   Number of children: Not on file   Years of education: Not on file   Highest education level: Not on file  Occupational History   Occupation: Programmer, multimedia: [employer]  Tobacco Use   Smoking status: Never    Passive exposure: Never   Smokeless tobacco: Never  Vaping Use   Vaping Use: Never used  Substance and Sexual Activity   Alcohol use: Yes    Alcohol/week: 3.0 standard drinks of alcohol    Types: 3 Glasses of wine per week   Drug use: Never   Sexual activity: Yes    Birth control/protection: Pill  Other Topics Concern   Not on file  Social History Narrative   Works as an Therapist, sports for [employer]   No children   Not married   Has a dog named Water quality scientist   From [place]- moved to [place] in 2002   Parents are both living   Has one sister back home   Enjoys going out to eat with friends   Enjoys biking and kayaking.    Goes to the GYM 6 days a week   Social Determinants of Radio broadcast assistant Strain: Not on file  Food Insecurity: Not on file  Transportation Needs: Not on file  Physical Activity: Not on file  Stress: Not on file  Social Connections: Not on file  Intimate Partner Violence: Not on file    Outpatient Medications Prior to Visit  Medication  Sig Dispense Refill   B Complex-C (SUPER B COMPLEX PO) Take by mouth.     Barberry-Oreg Grape-Goldenseal (BERBERINE COMPLEX PO) Take by mouth.     cetirizine (ZYRTEC) 10 MG tablet Take 10 mg by mouth daily.     clobetasol (TEMOVATE) 0.05 % external solution Apply 1 application topically 2 (two) times daily. [place]  mL 1   diltiazem (CARDIZEM CD) 180 MG 24 hr capsule Take 1 capsule (180 mg total) by mouth daily. 90 capsule 0   docusate sodium (COLACE) 100 MG capsule Take 100 mg by mouth every other day.     Ferrous Sulfate (IRON) 325 (65 Fe) MG TABS Take 1 tablet (325 mg total) by mouth every other day. 30 tablet 0   folic acid (FOLVITE) 1 MG tablet Take 1 mg by mouth daily.     gabapentin (NEURONTIN) 100 MG capsule Take 1 capsule (100 mg total) by mouth 3 (three) times daily. 90 capsule 1   ketoconazole (NIZORAL) 2 % shampoo Apply 1 application. topically daily.     norethindrone-ethinyl estradiol-iron (MICROGESTIN FE 1.5/30) 1.5-30 MG-MCG tablet Take 1 tablet by mouth daily. Skip placebo and take continuously 84 tablet 5   Semaglutide-Weight Management (WEGOVY) 2.4 MG/0.75ML SOAJ Inject 2.4 mg into the skin once a week. 3 mL 2   buPROPion (WELLBUTRIN XL) 150 MG 24 hr tablet TAKE 1 TABLET (150 MG TOTAL) BY MOUTH DAILY. 90 tablet 1   nitrofurantoin, macrocrystal-monohydrate, (MACROBID) 100 MG capsule Take 1 capsule (100 mg total) by mouth 2 (two) times daily. 10 capsule 0   No facility-administered medications prior to visit.    Allergies  Allergen Reactions   Almond Oil Swelling   Apple Juice Swelling   Kiwi Extract Swelling   Soy Allergy Itching    Mouth/ tongue itch   Strawberry Extract Itching    Mouth/ tongue itch    Review of Systems  Constitutional:  Negative for fever.  HENT:  Negative for congestion, sinus pain and sore throat.   Respiratory:  Negative for cough, shortness of breath and wheezing.   Cardiovascular:  Negative for chest pain and palpitations.  Gastrointestinal:   Negative for blood in stool, constipation, diarrhea, nausea and vomiting.  Genitourinary:  Negative for dysuria, frequency and hematuria.  Musculoskeletal:  Negative for joint pain and myalgias.  Skin:        + right cheek skin lesion  Neurological:  Positive for tingling (left foot) and sensory change (left foot numbness/tingling).  Psychiatric/Behavioral:  Negative for depression.        Objective:    Physical Exam Constitutional:      Appearance: Normal appearance. She is well-developed.  HENT:     Head: Normocephalic and atraumatic.  Eyes:     General: Lids are normal.     Extraocular Movements: Extraocular movements intact.     Conjunctiva/sclera: Conjunctivae normal.  Pulmonary:     Effort: Pulmonary effort is normal.  Musculoskeletal:        General: Normal range of motion.     Cervical back: Normal range of motion and neck supple.  Skin:    General: Skin is warm and dry.     Comments: Raised pink lesion on right cheek  Neurological:     Mental Status: She is alert and oriented to person, place, and time.  Psychiatric:        Attention and Perception: Attention and perception normal.        Mood and Affect: Mood normal.        Behavior: Behavior normal.        Thought Content: Thought content normal.        Judgment: Judgment normal.     BP (!) 107/43 (BP Location: Right Arm, Patient Position: Sitting, Cuff Size: Large)   Pulse 69   Temp 97.6 F (36.4 C) (Oral)  Resp 16   Wt 199 lb (90.3 kg)   SpO2 99%   BMI 34.16 kg/m  Wt Readings from Last 3 Encounters:  10/20/22 199 lb (90.3 kg)  08/03/22 205 lb (93 kg)  06/19/22 205 lb (93 kg)       Assessment & Plan:  B12 deficiency Assessment & Plan: Encouraged her to be more regular about her b12 injections.    Orders: -     Vitamin B12 -     Iron, TIBC and Ferritin Panel -     CBC with Differential/Platelet  Skin lesion Assessment & Plan: New- noted on right cheek.  Will refer to dermatology for  further evaluation.   Orders: -     Ambulatory referral to Dermatology  Obesity (BMI 30.0-34.9) Assessment & Plan: Wt Readings from Last 3 Encounters:  10/20/22 199 lb (90.3 kg)  08/03/22 205 lb (93 kg)  06/19/22 205 lb (93 kg)   Patient was having better weight loss on Mounjaro but insurance stopped covering it so we went back to [employer].  Will see if we can get Zepbound approved for patient now that there is a formal weight loss indication.   Other depression Assessment & Plan: Reports mood is good. She wishes to try to come off of wellbutrin.  She will let me know how she does after stopping.    Chronic foot pain, unspecified laterality Assessment & Plan: She is currently taking gabapentin bid which she finds helpful for this chronic pain that she has experienced following her foot surgery.   Iron deficiency anemia, unspecified iron deficiency anemia type Assessment & Plan: She has not completed IFOB but I reminded her to do so.  Obtain cbc, iron studies. Continue oral iron supplement.    Other orders -     Zepbound; Inject 7.5 mg into the skin once a week.  Dispense: 2 mL; Refill: 1     I,Alexis Herring,acting as a scribe for Nance Pear, NP.,have documented all relevant documentation on the behalf of Nance Pear, NP,as directed by  Nance Pear, NP while in the presence of Nance Pear, NP.   I, Nance Pear, NP, personally preformed the services described in this documentation.  All medical record entries made by the scribe were at my direction and in my presence.  I have reviewed the chart and discharge instructions (if applicable) and agree that the record reflects my personal performance and is accurate and complete. 10/20/22   Nance Pear, NP

## 2022-10-20 NOTE — Assessment & Plan Note (Signed)
Encouraged her to be more regular about her b12 injections.

## 2022-10-20 NOTE — Assessment & Plan Note (Signed)
Reports mood is good. She wishes to try to come off of wellbutrin.  She will let me know how she does after stopping.

## 2022-10-20 NOTE — Telephone Encounter (Signed)
PA initiated via Covermymeds; KEY:  [id number]. Awaiting determination.

## 2022-10-20 NOTE — Assessment & Plan Note (Addendum)
Wt Readings from Last 3 Encounters:  10/20/22 199 lb (90.3 kg)  08/03/22 205 lb (93 kg)  06/19/22 205 lb (93 kg)   Patient was having better weight loss on Mounjaro but insurance stopped covering it so we went back to [facility].  Will see if we can get Zepbound approved for patient now that there is a formal weight loss indication.

## 2022-10-20 NOTE — Assessment & Plan Note (Signed)
She has not completed IFOB but I reminded her to do so.  Obtain cbc, iron studies. Continue oral iron supplement.

## 2022-10-20 NOTE — Assessment & Plan Note (Signed)
New- noted on right cheek.  Will refer to dermatology for further evaluation.

## 2022-10-21 ENCOUNTER — Other Ambulatory Visit (HOSPITAL_BASED_OUTPATIENT_CLINIC_OR_DEPARTMENT_OTHER): Payer: Self-pay

## 2022-10-21 LAB — IRON,TIBC AND FERRITIN PANEL
%SAT: 6 % (calc) — ABNORMAL LOW (ref 16–45)
Ferritin: 3 ng/mL — ABNORMAL LOW (ref 16–232)
Iron: 30 ug/dL — ABNORMAL LOW (ref 40–190)
TIBC: 504 mcg/dL (calc) — ABNORMAL HIGH (ref 250–450)

## 2022-10-22 ENCOUNTER — Other Ambulatory Visit (HOSPITAL_BASED_OUTPATIENT_CLINIC_OR_DEPARTMENT_OTHER): Payer: Self-pay

## 2022-10-22 ENCOUNTER — Other Ambulatory Visit (INDEPENDENT_AMBULATORY_CARE_PROVIDER_SITE_OTHER): Payer: 59

## 2022-10-22 DIAGNOSIS — D649 Anemia, unspecified: Secondary | ICD-10-CM

## 2022-10-22 NOTE — Telephone Encounter (Signed)
PA approved.   The request has been approved. The authorization is effective from 10/21/2022 to 04/19/2023, as long as the member is enrolled in their current health plan. The request was approved as submitted. This request has been approved, allowing for 70m (4 pens) per 28 days.Additional authorizations have been entered for the following:Zepbound 2.'5mg'$ /0.530mallowing 32m30m4 pens) per 28 days; please reference authorization [id number].Zepbound '5mg'$ /0.5mL47mlowing 32mL 70mpens) per 28 days; please reference authorization [id number] '10mg'$ /0.5mL a58mwing 32mL (4732mns) per 28 days; please reference authorization [id number] '12mg'$ /0.5mL all16mng 32mL (4 p25m) per 28 days; please reference authorization [id number] '15mg'$ /0.5mL allow15m 32mL (4 pen11mper 28 days; please reference authorization [id number] are effective 10/21/2022 through 04/19/2023. A written notification letter will follow with additional details.

## 2022-10-22 NOTE — Addendum Note (Signed)
Addended by: Manuela Schwartz on: 10/22/2022 08:02 AM   Modules accepted: Orders

## 2022-10-23 ENCOUNTER — Other Ambulatory Visit: Payer: Self-pay | Admitting: Family

## 2022-10-23 DIAGNOSIS — D509 Iron deficiency anemia, unspecified: Secondary | ICD-10-CM

## 2022-10-23 LAB — FECAL OCCULT BLOOD, IMMUNOCHEMICAL: Fecal Occult Bld: NEGATIVE

## 2022-10-27 ENCOUNTER — Other Ambulatory Visit: Payer: Self-pay | Admitting: Family

## 2022-10-27 DIAGNOSIS — D649 Anemia, unspecified: Secondary | ICD-10-CM

## 2022-10-28 ENCOUNTER — Encounter: Payer: Self-pay | Admitting: Oncology

## 2022-10-28 ENCOUNTER — Inpatient Hospital Stay: Payer: 59 | Attending: Hematology & Oncology

## 2022-10-28 ENCOUNTER — Inpatient Hospital Stay: Payer: 59 | Admitting: Oncology

## 2022-10-28 ENCOUNTER — Other Ambulatory Visit: Payer: Self-pay

## 2022-10-28 VITALS — BP 107/56 | HR 100 | Temp 98.6°F | Resp 18 | Ht 64.0 in | Wt 202.1 lb

## 2022-10-28 DIAGNOSIS — E538 Deficiency of other specified B group vitamins: Secondary | ICD-10-CM | POA: Insufficient documentation

## 2022-10-28 DIAGNOSIS — D508 Other iron deficiency anemias: Secondary | ICD-10-CM

## 2022-10-28 DIAGNOSIS — D509 Iron deficiency anemia, unspecified: Secondary | ICD-10-CM | POA: Insufficient documentation

## 2022-10-28 DIAGNOSIS — D649 Anemia, unspecified: Secondary | ICD-10-CM

## 2022-10-28 LAB — CBC WITH DIFFERENTIAL (CANCER CENTER ONLY)
Abs Immature Granulocytes: 0.03 10*3/uL (ref 0.00–0.07)
Basophils Absolute: 0.1 10*3/uL (ref 0.0–0.1)
Basophils Relative: 1 %
Eosinophils Absolute: 0.1 10*3/uL (ref 0.0–0.5)
Eosinophils Relative: 2 %
HCT: 38.2 % (ref 36.0–46.0)
Hemoglobin: 11.5 g/dL — ABNORMAL LOW (ref 12.0–15.0)
Immature Granulocytes: 0 %
Lymphocytes Relative: 25 %
Lymphs Abs: 2 10*3/uL (ref 0.7–4.0)
MCH: 24.2 pg — ABNORMAL LOW (ref 26.0–34.0)
MCHC: 30.1 g/dL (ref 30.0–36.0)
MCV: 80.4 fL (ref 80.0–100.0)
Monocytes Absolute: 0.4 10*3/uL (ref 0.1–1.0)
Monocytes Relative: 5 %
Neutro Abs: 5.4 10*3/uL (ref 1.7–7.7)
Neutrophils Relative %: 67 %
Platelet Count: 402 10*3/uL — ABNORMAL HIGH (ref 150–400)
RBC: 4.75 MIL/uL (ref 3.87–5.11)
RDW: 15.4 % (ref 11.5–15.5)
WBC Count: 8 10*3/uL (ref 4.0–10.5)
nRBC: 0 % (ref 0.0–0.2)

## 2022-10-28 LAB — RETICULOCYTES
Immature Retic Fract: 13.2 % (ref 2.3–15.9)
RBC.: 4.67 MIL/uL (ref 3.87–5.11)
Retic Count, Absolute: 47.6 10*3/uL (ref 19.0–186.0)
Retic Ct Pct: 1 % (ref 0.4–3.1)

## 2022-10-28 LAB — CMP (CANCER CENTER ONLY)
ALT: 15 U/L (ref 0–44)
AST: 16 U/L (ref 15–41)
Albumin: 4 g/dL (ref 3.5–5.0)
Alkaline Phosphatase: 58 U/L (ref 38–126)
Anion gap: 11 (ref 5–15)
BUN: 25 mg/dL — ABNORMAL HIGH (ref 6–20)
CO2: 22 mmol/L (ref 22–32)
Calcium: 9 mg/dL (ref 8.9–10.3)
Chloride: 107 mmol/L (ref 98–111)
Creatinine: 0.84 mg/dL (ref 0.44–1.00)
GFR, Estimated: >60 mL/min (ref >60)
Glucose, Bld: 136 mg/dL — ABNORMAL HIGH (ref 70–99)
Potassium: 4.2 mmol/L (ref 3.5–5.1)
Sodium: 140 mmol/L (ref 135–145)
Total Bilirubin: 1.1 mg/dL (ref 0.3–1.2)
Total Protein: 6.9 g/dL (ref 6.5–8.1)

## 2022-10-28 LAB — LACTATE DEHYDROGENASE: LDH: 121 U/L (ref 98–192)

## 2022-10-28 NOTE — Progress Notes (Signed)
[place]  [phone]) [phone] [phone]) [phone]    [place]  Referring MD:  [name], NP  Reason for Referral: Anemia  HPI: Ms. Mort is a 47 year old female with a past medical history significant for depression, SVT, anemia secondary to iron deficiency and B12 deficiency, obesity status post gastric bypass surgery in 2007.  She has been referred to [name] for anemia.  The patient reports that she has been anemic ever since her gastric bypass surgery in 2007.  She has been taking oral iron every other day.  Received IV iron on 1 occasion in 2014.  It was an 8-hour long infusion so sounds that she may have received iron dextran.  She has not required a blood transfusion.  Reports that she is on B12 injections.  She is supposed be taking these weekly but typically takes 1 injection every other week.  She reports that she craves ice but otherwise feels that she is asymptomatic secondary to her anemia.  She denies headaches, dizziness, lightheadedness, chest pain, dyspnea with exertion, abdominal pain, nausea, vomiting, constipation, diarrhea.  She reports that her periods are very light and in fact she misses periods due to her contraception.  She has not noticed any epistaxis, hemoptysis, hematemesis, hematuria, melena, hematochezia.  She had labs performed by her primary care provider on 10/20/2022.  Her hemoglobin was 10.9, hematocrit 34.3, MCV 76.4, RDW 16, platelets 467,000, vitamin B12 290, iron 30, TIBC 504, percent saturation 6%, ferritin 3.  Stool for occult blood was negative.  She had a colonoscopy performed on 04/16/2022 which was normal.  Family history significant for a mother and sister both with anemia.  Her sister had anemia during pregnancy and required transfusions.  Her mother developed anemia following gastric bypass surgery.  She is single.  No children.  Drinks about 4 glasses of wine per week.  No tobacco use.  Hematology was  asked to consult on this patient to make recommendations regarding her anemia.   Past Medical History:  Diagnosis Date   B12 deficiency    Benign paroxysmal positional vertigo of right ear 07/15/2021   Depression    Heart murmur    History of COVID-19 2020   per pt mild symptoms that resolved   Hypermetropia of both eyes 09/12/2014   IDA (iron deficiency anemia)    Impingement syndrome of right shoulder 05/21/2015   Impingement syndrome of right shoulder 05/21/2015   Lactose intolerance    per pt due to gastric bypass   Morbid obesity ([place]) 08/15/2017   Nevus of choroid of left eye 09/12/2014   being monitored   Palpitations    due ot SVT   Psoriasis    Seborrheic dermatitis 02/08/2015   Status post gastric bypass for obesity 12/08/2016   SVT (supraventricular tachycardia)    cardiologist--- dr Edyth Gunnels;    normal echo in epic 02-17-2021 and event monitor 02-04-2021  symptomatic SVT w/ 5 brief episodes the longest 14 beats   Vitamin D deficiency    Wears glasses   :   Past Surgical History:  Procedure Laterality Date   COLPOSCOPY  2008   ENDOMETRIAL BIOPSY  2017   negative per pt   FOOT SURGERY Left 12/17/2021   LAPAROSCOPIC CHOLECYSTECTOMY  2006   MASS EXCISION Left 08/17/2017   Procedure: EXCISION SOFT TISSUE  MASS LEFT FLANK;  Surgeon: [name], MD;  Location: [facility];  Service: General;  Laterality: Left;   ROUX-EN-Y GASTRIC BYPASS  2007   TENDON REPAIR Left 12/17/2021   Procedure: TENDON REPAIR OF LEFT FOOT;  Surgeon: Trula Slade, DPM;  Location: [facility];  Service: Podiatry;  Laterality: Left;  GENERAL WITH BLOCK  :   CURRENT MEDS: Current Outpatient Medications  Medication Sig Dispense Refill   B Complex-C (SUPER B COMPLEX PO) Take by mouth.     Barberry-Oreg Grape-Goldenseal (BERBERINE COMPLEX PO) Take by mouth.     cetirizine (ZYRTEC) 10 MG tablet Take 10 mg by mouth daily.     clobetasol (TEMOVATE) 0.05 %  external solution Apply 1 application topically 2 (two) times daily. 50 mL 1   diltiazem (CARDIZEM CD) 180 MG 24 hr capsule Take 1 capsule (180 mg total) by mouth daily. 90 capsule 0   docusate sodium (COLACE) 100 MG capsule Take 100 mg by mouth every other day.     Ferrous Sulfate (IRON) 325 (65 Fe) MG TABS Take 1 tablet (325 mg total) by mouth every other day. 30 tablet 0   folic acid (FOLVITE) 1 MG tablet Take 1 mg by mouth daily.     gabapentin (NEURONTIN) 100 MG capsule Take 1 capsule (100 mg total) by mouth 3 (three) times daily. 90 capsule 1   ketoconazole (NIZORAL) 2 % shampoo Apply 1 application. topically daily.     norethindrone-ethinyl estradiol-iron (MICROGESTIN FE 1.5/30) 1.5-30 MG-MCG tablet Take 1 tablet by mouth daily. Skip placebo and take continuously 84 tablet 5   Semaglutide-Weight Management (WEGOVY) 2.4 MG/0.75ML SOAJ Inject 2.4 mg into the skin once a week. 3 mL 2   tirzepatide (ZEPBOUND) 7.5 MG/0.5ML Pen Inject 7.5 mg into the skin once a week. 2 mL 1   No current facility-administered medications for this visit.    Allergies  Allergen Reactions   Almond Oil Swelling   Apple Juice Swelling   Kiwi Extract Swelling   Soy Allergy Itching    Mouth/ tongue itch   Strawberry Extract Itching    Mouth/ tongue itch  :   Family History  Problem Relation Age of Onset   Hypertension Mother    Obesity Mother    Hypertension Father    Sleep apnea Father    Obesity Father    Colon polyps Father    Alzheimer's disease Maternal Grandmother    Leukemia Maternal Grandfather    Alzheimer's disease Paternal Grandmother    Cancer Paternal Grandfather    Cancer - Other Paternal Grandfather    Diabetes type II Maternal Aunt    Stroke Maternal Aunt    Multiple sclerosis Maternal Aunt    Heart attack Paternal Aunt    Colon cancer Neg Hx    Rectal cancer Neg Hx    Stomach cancer Neg Hx    Esophageal cancer Neg Hx   :   Social History   Socioeconomic History    Marital status: Single    Spouse name: Not on file   Number of children: Not on file   Years of education: Not on file   Highest education level: Not on file  Occupational History   Occupation: Programmer, multimedia: [employer]  Tobacco Use   Smoking status: Never    Passive exposure: Never   Smokeless tobacco: Never  Vaping Use   Vaping Use: Never used  Substance and Sexual Activity   Alcohol use: Yes    Alcohol/week: 3.0 standard drinks of alcohol    Types: 3 Glasses of wine per week   Drug use: Never  Sexual activity: Yes    Birth control/protection: Pill  Other Topics Concern   Not on file  Social History Narrative   Works as an Therapist, sports for [employer]   No children   Not married   Has a dog named Water quality scientist   From [place]- moved to [place] in 2002   Parents are both living   Has one sister back home   Enjoys going out to eat with friends   Enjoys biking and kayaking.    Goes to the GYM 6 days a week   Social Determinants of Radio broadcast assistant Strain: Not on file  Food Insecurity: Not on file  Transportation Needs: Not on file  Physical Activity: Not on file  Stress: Not on file  Social Connections: Not on file  Intimate Partner Violence: Not on file  :  REVIEW OF SYSTEMS:  A comprehensive 14 point review of systems was negative except as noted in the HPI.    Exam: BP (!) 107/56 (BP Location: Left Arm, Patient Position: Sitting)   Pulse 100   Temp 98.6 F (37 C) (Oral)   Resp 18   Ht '5\' 4"'$  (1.626 m)   Wt 91.7 kg   SpO2 100%   BMI 34.69 kg/m    Physical Exam Vitals reviewed.  Constitutional:      General: She is not in acute distress. HENT:     Head: Normocephalic.     Mouth/Throat:     Mouth: Mucous membranes are moist.     Pharynx: No oropharyngeal exudate or posterior oropharyngeal erythema.  Eyes:     General: No scleral icterus. Cardiovascular:     Rate and Rhythm: Normal rate and regular rhythm.  Pulmonary:     Effort: Pulmonary  effort is normal. No respiratory distress.     Breath sounds: Normal breath sounds.  Abdominal:     General: Bowel sounds are normal.     Palpations: Abdomen is soft.     Tenderness: There is no abdominal tenderness.  Musculoskeletal:        General: No swelling or tenderness.     Cervical back: Neck supple.     Right lower leg: No edema.     Left lower leg: No edema.  Skin:    General: Skin is warm and dry.     Findings: No rash.  Neurological:     General: No focal deficit present.     Mental Status: She is alert and oriented to person, place, and time.  Psychiatric:        Mood and Affect: Mood normal.        Behavior: Behavior normal.        Thought Content: Thought content normal.        Judgment: Judgment normal.   LABS:  Lab Results  Component Value Date   WBC 6.4 10/20/2022   HGB 10.9 (L) 10/20/2022   HCT 34.3 (L) 10/20/2022   PLT 467.0 (H) 10/20/2022   GLUCOSE 149 (H) 07/15/2021   CHOL 196 06/18/2021   TRIG 75.0 06/18/2021   HDL 104.50 06/18/2021   LDLCALC 76 06/18/2021   ALT 29 07/15/2021   AST 25 07/15/2021   NA 137 07/15/2021   K 4.0 07/15/2021   CL 107 07/15/2021   CREATININE 0.71 07/15/2021   BUN 17 07/15/2021   CO2 22 07/15/2021   HGBA1C 5.1 07/17/2021    No results found.   ASSESSMENT AND PLAN:   This is a  47 year old female with microcytic anemia.  Labs clearly show iron deficiency.  We have repeated her ferritin and iron studies today.  However, about 1 week ago, labs showed iron deficiency.  She has been taking oral iron and despite this, she remains iron deficient.  She has no evidence of bleeding and colonoscopy performed within the past year was normal.  Given her history of gastric bypass surgery, this is an absorption issue as opposed to blood loss.  We discussed proceeding with IV iron.  I recommend Venofer 300 mg IV weekly x 3 doses.  We discussed the adverse effects of this treatment including risk of infusion reaction and she agrees  to proceed.  Will plan to initiate treatment next week once we obtain insurance approval.  Her B12 level was low normal and I recommended for her to try to take her B12 injections weekly.  We can consider cutting back on the frequency if her levels improve in the future.  She will also continue to take folic acid.  Follow-up visit with repeat labs in approximately 3 months.   Thank you for this referral.  Mikey Bussing, DNP, AGPCNP-BC, AOCNP

## 2022-10-30 LAB — ERYTHROPOIETIN: Erythropoietin: 46.2 m[IU]/mL — ABNORMAL HIGH (ref 2.6–18.5)

## 2022-11-04 ENCOUNTER — Inpatient Hospital Stay: Payer: 59

## 2022-11-04 VITALS — BP 101/42 | HR 76 | Temp 98.2°F | Resp 17

## 2022-11-04 DIAGNOSIS — D509 Iron deficiency anemia, unspecified: Secondary | ICD-10-CM

## 2022-11-04 DIAGNOSIS — E538 Deficiency of other specified B group vitamins: Secondary | ICD-10-CM | POA: Diagnosis not present

## 2022-11-04 MED ORDER — LORATADINE 10 MG PO TABS
10.0000 mg | ORAL_TABLET | Freq: Every day | ORAL | Status: DC
  Filled 2022-11-04: qty 1

## 2022-11-04 MED ORDER — SODIUM CHLORIDE 0.9 % IV SOLN
300.0000 mg | Freq: Once | INTRAVENOUS | Status: AC
  Administered 2022-11-04: 300 mg via INTRAVENOUS
  Filled 2022-11-04: qty 300

## 2022-11-04 MED ORDER — SODIUM CHLORIDE 0.9 % IV SOLN: Freq: Once | INTRAVENOUS | Status: AC

## 2022-11-04 NOTE — Patient Instructions (Signed)
Iron Sucrose Injection What is this medication? IRON SUCROSE (EYE ern SOO krose) treats low levels of iron (iron deficiency anemia) in people with kidney disease. Iron is a mineral that plays an important role in making red blood cells, which carry oxygen from your lungs to the rest of your body. This medicine may be used for other purposes; ask your health care provider or pharmacist if you have questions. COMMON BRAND NAME(S): Venofer What should I tell my care team before I take this medication? They need to know if you have any of these conditions: Anemia not caused by low iron levels Heart disease High levels of iron in the blood Kidney disease Liver disease An unusual or allergic reaction to iron, other medications, foods, dyes, or preservatives Pregnant or trying to get pregnant Breastfeeding How should I use this medication? This medication is for infusion into a vein. It is given in a hospital or clinic setting. Talk to your care team about the use of this medication in children. While this medication may be prescribed for children as young as 2 years for selected conditions, precautions do apply. Overdosage: If you think you have taken too much of this medicine contact a poison control center or emergency room at once. NOTE: This medicine is only for you. Do not share this medicine with others. What if I miss a dose? Keep appointments for follow-up doses. It is important not to miss your dose. Call your care team if you are unable to keep an appointment. What may interact with this medication? Do not take this medication with any of the following: Deferoxamine Dimercaprol Other iron products This medication may also interact with the following: Chloramphenicol Deferasirox This list may not describe all possible interactions. Give your health care provider a list of all the medicines, herbs, non-prescription drugs, or dietary supplements you use. Also tell them if you smoke,  drink alcohol, or use illegal drugs. Some items may interact with your medicine. What should I watch for while using this medication? Visit your care team regularly. Tell your care team if your symptoms do not start to get better or if they get worse. You may need blood work done while you are taking this medication. You may need to follow a special diet. Talk to your care team. Foods that contain iron include: whole grains/cereals, dried fruits, beans, or peas, leafy green vegetables, and organ meats (liver, kidney). What side effects may I notice from receiving this medication? Side effects that you should report to your care team as soon as possible: Allergic reactions--skin rash, itching, hives, swelling of the face, lips, tongue, or throat Low blood pressure--dizziness, feeling faint or lightheaded, blurry vision Shortness of breath Side effects that usually do not require medical attention (report to your care team if they continue or are bothersome): Flushing Headache Joint pain Muscle pain Nausea Pain, redness, or irritation at injection site This list may not describe all possible side effects. Call your doctor for medical advice about side effects. You may report side effects to FDA at 1-800-FDA-1088. Where should I keep my medication? This medication is given in a hospital or clinic and will not be stored at home. NOTE: This sheet is a summary. It may not cover all possible information. If you have questions about this medicine, talk to your doctor, pharmacist, or health care provider.  2023 Elsevier/Gold Standard (2021-01-02 00:00:00)

## 2022-11-06 ENCOUNTER — Other Ambulatory Visit (HOSPITAL_BASED_OUTPATIENT_CLINIC_OR_DEPARTMENT_OTHER): Payer: Self-pay

## 2022-11-06 ENCOUNTER — Telehealth: Payer: Self-pay | Admitting: Cardiology

## 2022-11-06 ENCOUNTER — Telehealth: Payer: Self-pay | Admitting: Family

## 2022-11-06 ENCOUNTER — Other Ambulatory Visit (INDEPENDENT_AMBULATORY_CARE_PROVIDER_SITE_OTHER): Payer: 59

## 2022-11-06 DIAGNOSIS — R609 Edema, unspecified: Secondary | ICD-10-CM | POA: Diagnosis not present

## 2022-11-06 LAB — COMPREHENSIVE METABOLIC PANEL
ALT: 19 U/L (ref 0–35)
AST: 21 U/L (ref 0–37)
Albumin: 3.7 g/dL (ref 3.5–5.2)
Alkaline Phosphatase: 62 U/L (ref 39–117)
BUN: 13 mg/dL (ref 6–23)
CO2: 26 mEq/L (ref 19–32)
Calcium: 8.6 mg/dL (ref 8.4–10.5)
Chloride: 105 mEq/L (ref 96–112)
Creatinine, Ser: 0.61 mg/dL (ref 0.40–1.20)
GFR: 105.38 mL/min (ref >60.00)
Glucose, Bld: 88 mg/dL (ref 70–99)
Potassium: 4.4 mEq/L (ref 3.5–5.1)
Sodium: 137 mEq/L (ref 135–145)
Total Bilirubin: 0.6 mg/dL (ref 0.2–1.2)
Total Protein: 6.5 g/dL (ref 6.0–8.3)

## 2022-11-06 MED ORDER — ZEPBOUND 10 MG/0.5ML ~~LOC~~ SOAJ
10.0000 mg | SUBCUTANEOUS | 0 refills | Status: DC
  Filled 2022-11-06: qty 2, 28d supply, fill #0

## 2022-11-06 MED ORDER — DILTIAZEM HCL ER COATED BEADS 180 MG PO CP24
180.0000 mg | ORAL_CAPSULE | Freq: Every day | ORAL | 0 refills | Status: DC
  Filled 2022-11-06: qty 90, 90d supply, fill #0

## 2022-11-06 MED ORDER — FUROSEMIDE 20 MG PO TABS
20.0000 mg | ORAL_TABLET | Freq: Every day | ORAL | 3 refills | Status: DC
  Filled 2022-11-06: qty 90, 90d supply, fill #0
  Filled 2023-01-06 – 2023-02-17 (×2): qty 30, 30d supply, fill #1

## 2022-11-06 NOTE — Telephone Encounter (Signed)
*  STAT* If patient is at the pharmacy, call can be transferred to refill team.   1. Which medications need to be refilled? (please list name of each medication and dose if known) diltiazem (CARDIZEM CD) 180 MG 24 hr capsule (Expired)   2. Which pharmacy/location (including street and city if local pharmacy) is medication to be sent to?  [facility]    3. Do they need a 30 day or 90 day supply? 90    Patient has appt on  4/9

## 2022-11-06 NOTE — Telephone Encounter (Signed)
Refill of Diltiazem 180 mg sent to [facility].

## 2022-11-06 NOTE — Telephone Encounter (Signed)
Pt c/o of LE edema.  8 pounds of weight gain in 2 weeks.  Appetite is unchanged. Notes Pitting edema in the evening.  Will obtain bmet,   Rx with lasix once daily prn.  Wear support hose.  Update me in a week with progress.

## 2022-11-11 ENCOUNTER — Inpatient Hospital Stay: Payer: 59 | Attending: Hematology & Oncology

## 2022-11-11 VITALS — BP 117/68 | HR 75 | Temp 97.9°F | Resp 18

## 2022-11-11 DIAGNOSIS — D509 Iron deficiency anemia, unspecified: Secondary | ICD-10-CM | POA: Insufficient documentation

## 2022-11-11 MED ORDER — SODIUM CHLORIDE 0.9 % IV SOLN
300.0000 mg | Freq: Once | INTRAVENOUS | Status: AC
  Administered 2022-11-11: 300 mg via INTRAVENOUS
  Filled 2022-11-11: qty 300

## 2022-11-11 MED ORDER — SODIUM CHLORIDE 0.9 % IV SOLN: Freq: Once | INTRAVENOUS | Status: AC

## 2022-11-11 NOTE — Patient Instructions (Signed)

## 2022-11-12 ENCOUNTER — Other Ambulatory Visit: Payer: Self-pay

## 2022-11-12 ENCOUNTER — Other Ambulatory Visit (HOSPITAL_BASED_OUTPATIENT_CLINIC_OR_DEPARTMENT_OTHER): Payer: Self-pay

## 2022-11-12 ENCOUNTER — Encounter: Payer: Self-pay | Admitting: Hematology & Oncology

## 2022-11-12 MED ORDER — GABAPENTIN 100 MG PO CAPS
100.0000 mg | ORAL_CAPSULE | Freq: Three times a day (TID) | ORAL | 1 refills | Status: DC
  Filled 2022-11-12: qty 90, 30d supply, fill #0
  Filled 2022-12-08: qty 90, 30d supply, fill #1

## 2022-11-18 ENCOUNTER — Inpatient Hospital Stay: Payer: 59

## 2022-11-18 VITALS — BP 109/54 | HR 68 | Temp 97.6°F | Resp 17

## 2022-11-18 DIAGNOSIS — D509 Iron deficiency anemia, unspecified: Secondary | ICD-10-CM

## 2022-11-18 MED ORDER — SODIUM CHLORIDE 0.9 % IV SOLN
300.0000 mg | Freq: Once | INTRAVENOUS | Status: AC
  Administered 2022-11-18: 300 mg via INTRAVENOUS
  Filled 2022-11-18: qty 300

## 2022-11-18 MED ORDER — SODIUM CHLORIDE 0.9 % IV SOLN: Freq: Once | INTRAVENOUS | Status: AC

## 2022-11-18 NOTE — Patient Instructions (Signed)

## 2022-11-19 ENCOUNTER — Other Ambulatory Visit (HOSPITAL_BASED_OUTPATIENT_CLINIC_OR_DEPARTMENT_OTHER): Payer: Self-pay

## 2022-11-20 ENCOUNTER — Other Ambulatory Visit (HOSPITAL_BASED_OUTPATIENT_CLINIC_OR_DEPARTMENT_OTHER): Payer: Self-pay

## 2022-12-02 ENCOUNTER — Other Ambulatory Visit: Payer: Self-pay | Admitting: Family

## 2022-12-02 ENCOUNTER — Other Ambulatory Visit: Payer: Self-pay

## 2022-12-02 ENCOUNTER — Other Ambulatory Visit (HOSPITAL_BASED_OUTPATIENT_CLINIC_OR_DEPARTMENT_OTHER): Payer: Self-pay

## 2022-12-02 MED ORDER — ZEPBOUND 12.5 MG/0.5ML ~~LOC~~ SOAJ
12.5000 mg | SUBCUTANEOUS | 0 refills | Status: DC
  Filled 2022-12-02: qty 6, 84d supply, fill #0

## 2022-12-02 MED ORDER — CLOBETASOL PROPIONATE 0.05 % EX SOLN
1.0000 | Freq: Two times a day (BID) | CUTANEOUS | 1 refills | Status: DC
  Filled 2022-12-02: qty 50, 25d supply, fill #0
  Filled 2023-01-06: qty 50, 25d supply, fill #1

## 2022-12-02 MED ORDER — TIRZEPATIDE-WEIGHT MANAGEMENT 12.5 MG/0.5ML ~~LOC~~ SOAJ
12.5000 mg | SUBCUTANEOUS | 1 refills | Status: DC
  Filled 2022-12-02: qty 4, 56d supply, fill #0
  Filled 2022-12-02: qty 2, 28d supply, fill #0

## 2022-12-10 ENCOUNTER — Encounter: Payer: Self-pay | Admitting: Hematology & Oncology

## 2022-12-10 ENCOUNTER — Other Ambulatory Visit (HOSPITAL_BASED_OUTPATIENT_CLINIC_OR_DEPARTMENT_OTHER): Payer: Self-pay

## 2022-12-11 ENCOUNTER — Ambulatory Visit (INDEPENDENT_AMBULATORY_CARE_PROVIDER_SITE_OTHER): Payer: Self-pay | Admitting: Surgical

## 2022-12-11 DIAGNOSIS — Z411 Encounter for cosmetic surgery: Secondary | ICD-10-CM

## 2022-12-11 NOTE — Progress Notes (Signed)
Botulinum Toxin Procedure Note  Procedure: Cosmetic botulinum toxin  Pre-operative Diagnosis: Dynamic rhytides  Post-operative Diagnosis: Same  Complications:  None  Brief history: The patient desires botulinum toxin injection.  She is aware of the risks including bleeding, damage to deeper structures, asymmetry, brow ptosis, eyelid ptosis, bruising. The patient understands and wishes to proceed.  Procedure: The area was prepped with alcohol and dried with a clean gauze.  Using a clean technique the botulinum toxin was diluted with 2.5 mL of bacteriostatic saline per 100 unit vial which resulted in 4 units per 0.1 mL.  Subsequently the mixture was injected in the glabellar, lateral canthal lines, forehead area with preservation of the temporal branch to the lateral eyebrow. A total of 32 Units of botulinum toxin was used. The forehead and glabellar area was injected with care to inject intramuscular only while holding pressure on the supratrochlear vessels in each area during each injection on either side of the medial corrugators. The injection proceeded vertically superiorly to the medial 2/3 of the frontalis muscle and superior 2/3 of the lateral frontalis, again with preservation of the frontal branch.  No complications were noted. Light pressure was held for 5 minutes. She was instructed explicitly in post-operative care.  Botox LOT:  [id number] EXP:  12/2024

## 2022-12-31 DIAGNOSIS — L309 Dermatitis, unspecified: Secondary | ICD-10-CM | POA: Diagnosis not present

## 2022-12-31 DIAGNOSIS — L814 Other melanin hyperpigmentation: Secondary | ICD-10-CM | POA: Diagnosis not present

## 2022-12-31 DIAGNOSIS — D485 Neoplasm of uncertain behavior of skin: Secondary | ICD-10-CM | POA: Diagnosis not present

## 2022-12-31 DIAGNOSIS — L821 Other seborrheic keratosis: Secondary | ICD-10-CM | POA: Diagnosis not present

## 2022-12-31 DIAGNOSIS — D225 Melanocytic nevi of trunk: Secondary | ICD-10-CM | POA: Diagnosis not present

## 2023-01-06 ENCOUNTER — Other Ambulatory Visit: Payer: Self-pay

## 2023-01-06 ENCOUNTER — Other Ambulatory Visit: Payer: Self-pay | Admitting: Family

## 2023-01-06 ENCOUNTER — Other Ambulatory Visit (HOSPITAL_BASED_OUTPATIENT_CLINIC_OR_DEPARTMENT_OTHER): Payer: Self-pay

## 2023-01-06 MED ORDER — BUPROPION HCL ER (XL) 150 MG PO TB24
150.0000 mg | ORAL_TABLET | Freq: Every day | ORAL | 1 refills | Status: DC
  Filled 2023-01-06: qty 90, 90d supply, fill #0
  Filled 2023-03-29: qty 90, 90d supply, fill #1

## 2023-01-12 ENCOUNTER — Encounter: Payer: Self-pay | Admitting: Cardiology

## 2023-01-12 ENCOUNTER — Ambulatory Visit: Payer: 59 | Attending: Cardiology | Admitting: Cardiology

## 2023-01-12 VITALS — BP 116/78 | HR 72 | Ht 64.0 in | Wt 202.0 lb

## 2023-01-12 DIAGNOSIS — I471 Supraventricular tachycardia, unspecified: Secondary | ICD-10-CM | POA: Diagnosis not present

## 2023-01-12 DIAGNOSIS — D3132 Benign neoplasm of left choroid: Secondary | ICD-10-CM | POA: Diagnosis not present

## 2023-01-12 DIAGNOSIS — Z91018 Allergy to other foods: Secondary | ICD-10-CM | POA: Diagnosis not present

## 2023-01-12 NOTE — Progress Notes (Unsigned)
Cardiology Office Note:    Date:  01/12/2023   ID:  Emily NorrisKimberly [name], DOB June 04, 1976, MRN [id number]  PCP:  Sandford Craze'Sullivan, Melissa, NP  Cardiologist:  Gypsy Balsamobert Domanique Huesman, MD    Referring MD: Sandford Craze'Sullivan, Melissa, NP   Chief Complaint  Patient presents with   Follow-up  Doing well  History of Present Illness:    Emily [name] is a 47 y.o. female past medical history significant for supraventricular tachycardia only short episodes, managed with calcium channel blocker, she does have multiple food allergies.  Comes today for regular follow-up still described to have some skipped beats but does not bother her at all and she is happy the way she is.  She did have foot surgery a year ago.  Gradually getting back to her routine exercises overall doing well and denies having cardiac complaints except rare palpitations.  No chest pain no tightness no squeezing no pressure no burning in the chest.  Past Medical History:  Diagnosis Date   B12 deficiency    Benign paroxysmal positional vertigo of right ear 07/15/2021   Depression    Heart murmur    History of COVID-19 2020   per pt mild symptoms that resolved   Hypermetropia of both eyes 09/12/2014   IDA (iron deficiency anemia)    Impingement syndrome of right shoulder 05/21/2015   Impingement syndrome of right shoulder 05/21/2015   Lactose intolerance    per pt due to gastric bypass   Morbid obesity 08/15/2017   Nevus of choroid of left eye 09/12/2014   being monitored   Palpitations    due ot SVT   Psoriasis    Seborrheic dermatitis 02/08/2015   Status post gastric bypass for obesity 12/08/2016   SVT (supraventricular tachycardia)    cardiologist--- dr Townsend Rogerkrakowski;    normal echo in epic 02-17-2021 and event monitor 02-04-2021  symptomatic SVT w/ 5 brief episodes the longest 14 beats   Vitamin D deficiency    Wears glasses     Past Surgical History:  Procedure Laterality Date   COLPOSCOPY  2008   ENDOMETRIAL BIOPSY  2017   negative  per pt   FOOT SURGERY Left 12/17/2021   LAPAROSCOPIC CHOLECYSTECTOMY  2006   MASS EXCISION Left 08/17/2017   Procedure: EXCISION SOFT TISSUE  MASS LEFT FLANK;  Surgeon: Darnell LevelGerkin, Todd, MD;  Location: [facility];  Service: General;  Laterality: Left;   ROUX-EN-Y GASTRIC BYPASS  2007   TENDON REPAIR Left 12/17/2021   Procedure: TENDON REPAIR OF LEFT FOOT;  Surgeon: Vivi BarrackWagoner, Matthew R, DPM;  [facility];  Service: Podiatry;  Laterality: Left;  GENERAL WITH BLOCK    Current Medications: Current Meds  Medication Sig   B Complex-C (SUPER B COMPLEX PO) Take 1 tablet by mouth daily.   buPROPion (WELLBUTRIN XL) 150 MG 24 hr tablet Take 1 tablet (150 mg total) by mouth daily.   cetirizine (ZYRTEC) 10 MG tablet Take 10 mg by mouth daily.   clobetasol (TEMOVATE) 0.05 % external solution Apply 1 application topically 2 (two) times daily.   diltiazem (CARDIZEM CD) 180 MG 24 hr capsule Take 1 capsule (180 mg total) by mouth daily. Patient must keep appointment on 01/12/23 for further refills. 3 rd/final attempt   docusate sodium (COLACE) 100 MG capsule Take 100 mg by mouth every other day.   Ferrous Sulfate (IRON) 325 (65 Fe) MG TABS Take 1 tablet (325 mg total) by mouth every other day.   folic acid (FOLVITE) 1 MG  tablet Take 1 mg by mouth daily.   furosemide (LASIX) 20 MG tablet Take 1 tablet (20 mg total) by mouth daily.   gabapentin (NEURONTIN) 100 MG capsule Take 1 capsule (100 mg total) by mouth 3 (three) times daily.   ketoconazole (NIZORAL) 2 % shampoo Apply 1 application. topically daily.   norethindrone-ethinyl estradiol-iron (MICROGESTIN FE 1.5/30) 1.5-30 MG-MCG tablet Take 1 tablet by mouth daily. Skip placebo and take continuously   tirzepatide (ZEPBOUND) 12.5 MG/0.5ML Pen Inject 12.5 mg into the skin once a week.     Allergies:   Almond oil, Apple juice, Kiwi extract, Soy allergy, and Strawberry extract   Social History   Socioeconomic History    Marital status: Single    Spouse name: Not on file   Number of children: Not on file   Years of education: Not on file   Highest education level: Not on file  Occupational History   Occupation: Charity fundraiser    Employer: [employer]  Tobacco Use   Smoking status: Never    Passive exposure: Never   Smokeless tobacco: Never  Vaping Use   Vaping Use: Never used  Substance and Sexual Activity   Alcohol use: Yes    Alcohol/week: 3.0 standard drinks of alcohol    Types: 3 Glasses of wine per week   Drug use: Never   Sexual activity: Yes    Birth control/protection: Pill  Other Topics Concern   Not on file  Social History Narrative   Works as an Charity fundraiser for [employer]   No children   Not married   Has a dog named Doctor, general practice   From [place]- moved to [place] in 2002   Parents are both living   Has one sister back home   Enjoys going out to eat with friends   Enjoys biking and kayaking.    Goes to the GYM 6 days a week   Social Determinants of Corporate investment banker Strain: Not on file  Food Insecurity: Not on file  Transportation Needs: Not on file  Physical Activity: Not on file  Stress: Not on file  Social Connections: Not on file     Family History: The patient's family history includes Alzheimer's disease in her maternal grandmother and paternal grandmother; Cancer in her paternal grandfather; Cancer - Other in her paternal grandfather; Colon polyps in her father; Diabetes type II in her maternal aunt; Heart attack in her paternal aunt; Hypertension in her father and mother; Leukemia in her maternal grandfather; Multiple sclerosis in her maternal aunt; Obesity in her father and mother; Sleep apnea in her father; Stroke in her maternal aunt. There is no history of Colon cancer, Rectal cancer, Stomach cancer, or Esophageal cancer. ROS:   Please see the history of present illness.    All 14 point review of systems negative except as described per history of present  illness  EKGs/Labs/Other Studies Reviewed:      Recent Labs: 10/28/2022: Hemoglobin 11.5; Platelet Count 402 11/06/2022: ALT 19; BUN 13; Creatinine, Ser 0.61; Potassium 4.4; Sodium 137  Recent Lipid Panel    Component Value Date/Time   CHOL 196 06/18/2021 0805   CHOL 200 (H) 02/07/2018 0812   TRIG 75.0 06/18/2021 0805   HDL 104.50 06/18/2021 0805   HDL 113 02/07/2018 0812   CHOLHDL 2 06/18/2021 0805   VLDL 15.0 06/18/2021 0805   LDLCALC 76 06/18/2021 0805   LDLCALC 74 02/07/2018 0812    Physical Exam:    VS:  BP 116/78 (BP Location: Left Arm, Patient Position: Sitting)   Pulse 72   Ht 5\' 4"  (1.626 m)   Wt 202 lb (91.6 kg)   SpO2 99%   BMI 34.67 kg/m     Wt Readings from Last 3 Encounters:  01/12/23 202 lb (91.6 kg)  10/28/22 202 lb 1.9 oz (91.7 kg)  10/20/22 199 lb (90.3 kg)     GEN:  Well nourished, well developed in no acute distress HEENT: Normal NECK: No JVD; No carotid bruits LYMPHATICS: No lymphadenopathy CARDIAC: RRR, no murmurs, no rubs, no gallops RESPIRATORY:  Clear to auscultation without rales, wheezing or rhonchi  ABDOMEN: Soft, non-tender, non-distended MUSCULOSKELETAL:  No edema; No deformity  SKIN: Warm and dry LOWER EXTREMITIES: no swelling NEUROLOGIC:  Alert and oriented x 3 PSYCHIATRIC:  Normal affect   ASSESSMENT:    1. Supraventricular tachycardia   2. Multiple food allergies   3. Nevus of choroid of left eye    PLAN:    In order of problems listed above:  Supraventricular tachycardia only very short episode she is happy where she is continue present management. Swelling of lower extremities I suspect it is related to Cardizem.  She takes Lasix rarely on as needed basis. Dyslipidemia I did review K PN which show me her HDL of 104 which is excellent LDL 76.  Good cholesterol profile we will continue present management   Medication Adjustments/Labs and Tests Ordered: Current medicines are reviewed at length with the patient today.   Concerns regarding medicines are outlined above.  No orders of the defined types were placed in this encounter.  Medication changes: No orders of the defined types were placed in this encounter.   Signed, Georgeanna Lea, MD, [facility] 01/12/2023 11:22 AM    [facility]

## 2023-01-12 NOTE — Patient Instructions (Signed)

## 2023-01-27 ENCOUNTER — Encounter: Payer: Self-pay | Admitting: Family

## 2023-01-27 ENCOUNTER — Inpatient Hospital Stay: Payer: 59 | Admitting: Family

## 2023-01-27 ENCOUNTER — Other Ambulatory Visit: Payer: Self-pay

## 2023-01-27 ENCOUNTER — Inpatient Hospital Stay: Payer: 59 | Attending: Family

## 2023-01-27 VITALS — BP 124/76 | HR 85 | Temp 97.8°F | Resp 16

## 2023-01-27 DIAGNOSIS — D508 Other iron deficiency anemias: Secondary | ICD-10-CM

## 2023-01-27 DIAGNOSIS — E538 Deficiency of other specified B group vitamins: Secondary | ICD-10-CM

## 2023-01-27 DIAGNOSIS — K912 Postsurgical malabsorption, not elsewhere classified: Secondary | ICD-10-CM | POA: Diagnosis not present

## 2023-01-27 DIAGNOSIS — D509 Iron deficiency anemia, unspecified: Secondary | ICD-10-CM

## 2023-01-27 DIAGNOSIS — Z9884 Bariatric surgery status: Secondary | ICD-10-CM | POA: Insufficient documentation

## 2023-01-27 LAB — CBC WITH DIFFERENTIAL (CANCER CENTER ONLY)
Abs Immature Granulocytes: 0.02 10*3/uL (ref 0.00–0.07)
Basophils Absolute: 0 10*3/uL (ref 0.0–0.1)
Basophils Relative: 0 %
Eosinophils Absolute: 0.1 10*3/uL (ref 0.0–0.5)
Eosinophils Relative: 2 %
HCT: 41.2 % (ref 36.0–46.0)
Hemoglobin: 13.6 g/dL (ref 12.0–15.0)
Immature Granulocytes: 0 %
Lymphocytes Relative: 34 %
Lymphs Abs: 2.3 10*3/uL (ref 0.7–4.0)
MCH: 28.9 pg (ref 26.0–34.0)
MCHC: 33 g/dL (ref 30.0–36.0)
MCV: 87.7 fL (ref 80.0–100.0)
Monocytes Absolute: 0.3 10*3/uL (ref 0.1–1.0)
Monocytes Relative: 5 %
Neutro Abs: 4 10*3/uL (ref 1.7–7.7)
Neutrophils Relative %: 59 %
Platelet Count: 327 10*3/uL (ref 150–400)
RBC: 4.7 MIL/uL (ref 3.87–5.11)
RDW: 14.4 % (ref 11.5–15.5)
WBC Count: 6.8 10*3/uL (ref 4.0–10.5)
nRBC: 0 % (ref 0.0–0.2)

## 2023-01-27 LAB — IRON AND IRON BINDING CAPACITY (CC-WL,HP ONLY)
Iron: 125 ug/dL (ref 28–170)
Saturation Ratios: 27 % (ref 10.4–31.8)
TIBC: 463 ug/dL — ABNORMAL HIGH (ref 250–450)
UIBC: 338 ug/dL (ref 148–442)

## 2023-01-27 LAB — FERRITIN: Ferritin: 26 ng/mL (ref 11–307)

## 2023-01-27 LAB — VITAMIN B12: Vitamin B-12: 235 pg/mL (ref 180–914)

## 2023-01-27 LAB — FOLATE: Folate: 14.2 ng/mL (ref >5.9)

## 2023-01-27 NOTE — Progress Notes (Signed)
Hematology and Oncology Follow Up Visit  Emily [name] [id number] [birth year] 47 y.o. 01/27/2023   Principle Diagnosis:  Iron deficiency anemia secondary to malabsorption s/p gastric bypass in 2007.   Current Therapy:   IV iron as indicated    Interim History:  Emily [name] is here today for follow-up. She is doing quite well and has no complaints at this time.  She has not noted any blood loss. No bruising or petechiae.  No fever, chills, n/v, cough, rash, dizziness, SOB, chest pain, palpitations, abdominal pain or changes in bowel or bladder habits.  No swelling, tenderness in her extremities.  Numbness in the left foot post surgery unchanged.  No falls or syncope.  She has starting working out at [employer] with some friends again.  Appetite and hydration are good. Weight is stable at 202 lbs.   ECOG Performance Status: 1 - Symptomatic but completely ambulatory  Medications:  Allergies as of 01/27/2023       Reactions   Almond Oil Swelling   Apple Juice Swelling   Kiwi Extract Swelling   Soy Allergy Itching, Swelling   Mouth/ tongue itch   Strawberry Extract Itching   Mouth/ tongue itch        Medication List        Accurate as of January 27, 2023 10:41 AM. If you have any questions, ask your nurse or doctor.          Blisovi Fe 1.5/30 1.5-30 MG-MCG tablet Generic drug: norethindrone-ethinyl estradiol-iron Take 1 tablet by mouth daily. Skip placebo and take continuously   buPROPion 150 MG 24 hr tablet Commonly known as: WELLBUTRIN XL Take 1 tablet (150 mg total) by mouth daily.   cetirizine 10 MG tablet Commonly known as: ZYRTEC Take 10 mg by mouth daily.   clobetasol 0.05 % external solution Commonly known as: TEMOVATE Apply 1 application topically 2 (two) times daily.   diltiazem 180 MG 24 hr capsule Commonly known as: CARDIZEM CD Take 1 capsule (180 mg total) by mouth daily. Patient must keep appointment on 01/12/23 for further refills. 3 rd/final  attempt   docusate sodium 100 MG capsule Commonly known as: COLACE Take 100 mg by mouth every other day.   folic acid 1 MG tablet Commonly known as: FOLVITE Take 1 mg by mouth daily.   furosemide 20 MG tablet Commonly known as: LASIX Take 1 tablet (20 mg total) by mouth daily.   gabapentin 100 MG capsule Commonly known as: NEURONTIN Take 1 capsule (100 mg total) by mouth 3 (three) times daily.   Iron 325 (65 Fe) MG Tabs Take 1 tablet (325 mg total) by mouth every other day.   ketoconazole 2 % shampoo Commonly known as: NIZORAL Apply 1 application. topically daily.   SUPER B COMPLEX PO Take 1 tablet by mouth daily.   Zepbound 12.5 MG/0.5ML Pen Generic drug: tirzepatide Inject 12.5 mg into the skin once a week.        Allergies:  Allergies  Allergen Reactions   Almond Oil Swelling   Apple Juice Swelling   Kiwi Extract Swelling   Soy Allergy Itching and Swelling    Mouth/ tongue itch   Strawberry Extract Itching    Mouth/ tongue itch    Past Medical History, Surgical history, Social history, and Family History were reviewed and updated.  Review of Systems: All other 10 point review of systems is negative.   Physical Exam:  vitals were not taken for this visit.   Wt Readings  from Last 3 Encounters:  01/12/23 202 lb (91.6 kg)  10/28/22 202 lb 1.9 oz (91.7 kg)  10/20/22 199 lb (90.3 kg)    Ocular: Sclerae unicteric, pupils equal, round and reactive to light Ear-nose-throat: Oropharynx clear, dentition fair Lymphatic: No cervical or supraclavicular adenopathy Lungs no rales or rhonchi, good excursion bilaterally Heart regular rate and rhythm, no murmur appreciated Abd soft, nontender, positive bowel sounds MSK no focal spinal tenderness, no joint edema Neuro: non-focal, well-oriented, appropriate affect Breasts: Deferred   Lab Results  Component Value Date   WBC 6.8 01/27/2023   HGB 13.6 01/27/2023   HCT 41.2 01/27/2023   MCV 87.7 01/27/2023    PLT 327 01/27/2023   Lab Results  Component Value Date   FERRITIN 3 (L) 10/20/2022   IRON 30 (L) 10/20/2022   TIBC 504 (H) 10/20/2022   UIBC 377 02/18/2022   IRONPCTSAT 6 (L) 10/20/2022   Lab Results  Component Value Date   RETICCTPCT 1.0 10/28/2022   RBC 4.70 01/27/2023   No results found for: "KPAFRELGTCHN", "LAMBDASER", "KAPLAMBRATIO" No results found for: "IGGSERUM", "IGA", "IGMSERUM" No results found for: "TOTALPROTELP", "ALBUMINELP", "A1GS", "A2GS", "BETS", "BETA2SER", "GAMS", "MSPIKE", "SPEI"   Chemistry      Component Value Date/Time   NA 137 11/06/2022 0817   NA 142 02/07/2018 0812   K 4.4 11/06/2022 0817   CL 105 11/06/2022 0817   CO2 26 11/06/2022 0817   BUN 13 11/06/2022 0817   BUN 15 02/07/2018 0812   CREATININE 0.61 11/06/2022 0817   CREATININE 0.84 10/28/2022 1016      Component Value Date/Time   CALCIUM 8.6 11/06/2022 0817   ALKPHOS 62 11/06/2022 0817   AST 21 11/06/2022 0817   AST 16 10/28/2022 1016   ALT 19 11/06/2022 0817   ALT 15 10/28/2022 1016   BILITOT 0.6 11/06/2022 0817   BILITOT 1.1 10/28/2022 1016       Impression and Plan: Emily [name] is a very pleasant 47 yo caucasian female with iron deficiency anemia secondary to malabsorption s/p gastric bypass in 2007.  Iron studies are pending. We will replace if needed.  Follow-up in 3 months.   Eileen Stanford, NP 4/24/202410:41 AM

## 2023-02-17 ENCOUNTER — Other Ambulatory Visit (HOSPITAL_BASED_OUTPATIENT_CLINIC_OR_DEPARTMENT_OTHER): Payer: Self-pay

## 2023-02-17 ENCOUNTER — Other Ambulatory Visit: Payer: Self-pay

## 2023-03-02 ENCOUNTER — Other Ambulatory Visit (HOSPITAL_BASED_OUTPATIENT_CLINIC_OR_DEPARTMENT_OTHER): Payer: Self-pay

## 2023-03-02 ENCOUNTER — Other Ambulatory Visit: Payer: Self-pay

## 2023-03-02 MED ORDER — CLOBETASOL PROPIONATE 0.05 % EX SOLN
1.0000 | Freq: Two times a day (BID) | CUTANEOUS | 1 refills | Status: DC
  Filled 2023-03-02: qty 50, 25d supply, fill #0
  Filled 2023-03-29: qty 50, 25d supply, fill #1

## 2023-03-02 MED ORDER — GABAPENTIN 100 MG PO CAPS
100.0000 mg | ORAL_CAPSULE | Freq: Three times a day (TID) | ORAL | 1 refills | Status: DC
  Filled 2023-03-02: qty 90, 30d supply, fill #0
  Filled 2023-03-29: qty 90, 30d supply, fill #1

## 2023-03-17 ENCOUNTER — Other Ambulatory Visit (HOSPITAL_BASED_OUTPATIENT_CLINIC_OR_DEPARTMENT_OTHER): Payer: Self-pay

## 2023-03-17 ENCOUNTER — Encounter: Payer: Self-pay | Admitting: Cardiology

## 2023-03-17 MED ORDER — DILTIAZEM HCL ER COATED BEADS 120 MG PO CP24
120.0000 mg | ORAL_CAPSULE | Freq: Every day | ORAL | 3 refills | Status: DC
  Filled 2023-03-17: qty 90, 90d supply, fill #0
  Filled 2023-06-08: qty 90, 90d supply, fill #1
  Filled 2023-09-08: qty 90, 90d supply, fill #2
  Filled 2023-12-07: qty 90, 90d supply, fill #3

## 2023-03-18 ENCOUNTER — Other Ambulatory Visit (HOSPITAL_BASED_OUTPATIENT_CLINIC_OR_DEPARTMENT_OTHER): Payer: Self-pay

## 2023-03-19 ENCOUNTER — Other Ambulatory Visit (HOSPITAL_BASED_OUTPATIENT_CLINIC_OR_DEPARTMENT_OTHER): Payer: Self-pay

## 2023-04-26 ENCOUNTER — Other Ambulatory Visit: Payer: Self-pay

## 2023-04-26 ENCOUNTER — Other Ambulatory Visit (HOSPITAL_BASED_OUTPATIENT_CLINIC_OR_DEPARTMENT_OTHER): Payer: Self-pay

## 2023-04-26 ENCOUNTER — Telehealth: Payer: Self-pay | Admitting: *Deleted

## 2023-04-26 MED ORDER — GABAPENTIN 100 MG PO CAPS
100.0000 mg | ORAL_CAPSULE | Freq: Three times a day (TID) | ORAL | 1 refills | Status: DC
  Filled 2023-04-26: qty 90, 30d supply, fill #0
  Filled 2023-06-08: qty 90, 30d supply, fill #1

## 2023-04-26 NOTE — Telephone Encounter (Signed)
Called patient and lvm of new appointment, requested call back to confirm.

## 2023-04-28 ENCOUNTER — Other Ambulatory Visit (HOSPITAL_BASED_OUTPATIENT_CLINIC_OR_DEPARTMENT_OTHER): Payer: Self-pay

## 2023-04-28 ENCOUNTER — Other Ambulatory Visit: Payer: 59

## 2023-04-28 ENCOUNTER — Telehealth: Payer: Self-pay | Admitting: Family

## 2023-04-28 DIAGNOSIS — R300 Dysuria: Secondary | ICD-10-CM

## 2023-04-28 MED ORDER — NITROFURANTOIN MONOHYD MACRO 100 MG PO CAPS
100.0000 mg | ORAL_CAPSULE | Freq: Two times a day (BID) | ORAL | 0 refills | Status: DC
  Filled 2023-04-28: qty 10, 5d supply, fill #0

## 2023-04-28 NOTE — Telephone Encounter (Signed)
Pt having dysuria.  She will leave urine culture in the lab.  Rx sent for macrobid.

## 2023-04-29 LAB — URINE CULTURE: MICRO NUMBER:: 15240843

## 2023-05-05 ENCOUNTER — Ambulatory Visit: Payer: 59 | Admitting: Family

## 2023-05-05 ENCOUNTER — Other Ambulatory Visit: Payer: 59

## 2023-05-07 ENCOUNTER — Ambulatory Visit: Payer: 59 | Admitting: Family

## 2023-05-07 ENCOUNTER — Other Ambulatory Visit: Payer: 59

## 2023-05-10 ENCOUNTER — Inpatient Hospital Stay: Payer: 59 | Attending: Hematology & Oncology

## 2023-05-10 ENCOUNTER — Encounter: Payer: Self-pay | Admitting: Family

## 2023-05-10 ENCOUNTER — Inpatient Hospital Stay: Payer: 59 | Admitting: Family

## 2023-05-10 VITALS — BP 101/47 | HR 84 | Temp 98.3°F | Resp 17 | Wt 195.1 lb

## 2023-05-10 DIAGNOSIS — D509 Iron deficiency anemia, unspecified: Secondary | ICD-10-CM

## 2023-05-10 DIAGNOSIS — D508 Other iron deficiency anemias: Secondary | ICD-10-CM | POA: Insufficient documentation

## 2023-05-10 DIAGNOSIS — K912 Postsurgical malabsorption, not elsewhere classified: Secondary | ICD-10-CM | POA: Insufficient documentation

## 2023-05-10 DIAGNOSIS — E538 Deficiency of other specified B group vitamins: Secondary | ICD-10-CM

## 2023-05-10 DIAGNOSIS — Z9884 Bariatric surgery status: Secondary | ICD-10-CM | POA: Diagnosis not present

## 2023-05-10 LAB — CBC WITH DIFFERENTIAL (CANCER CENTER ONLY)
Abs Immature Granulocytes: 0.09 10*3/uL — ABNORMAL HIGH (ref 0.00–0.07)
Basophils Absolute: 0 10*3/uL (ref 0.0–0.1)
Basophils Relative: 0 %
Eosinophils Absolute: 0.1 10*3/uL (ref 0.0–0.5)
Eosinophils Relative: 2 %
HCT: 44.6 % (ref 36.0–46.0)
Hemoglobin: 14.4 g/dL (ref 12.0–15.0)
Immature Granulocytes: 1 %
Lymphocytes Relative: 28 %
Lymphs Abs: 2 10*3/uL (ref 0.7–4.0)
MCH: 29.2 pg (ref 26.0–34.0)
MCHC: 32.3 g/dL (ref 30.0–36.0)
MCV: 90.5 fL (ref 80.0–100.0)
Monocytes Absolute: 0.4 10*3/uL (ref 0.1–1.0)
Monocytes Relative: 5 %
Neutro Abs: 4.5 10*3/uL (ref 1.7–7.7)
Neutrophils Relative %: 64 %
Platelet Count: 314 10*3/uL (ref 150–400)
RBC: 4.93 MIL/uL (ref 3.87–5.11)
RDW: 12.6 % (ref 11.5–15.5)
WBC Count: 7.1 10*3/uL (ref 4.0–10.5)
nRBC: 0 % (ref 0.0–0.2)

## 2023-05-10 LAB — IRON AND IRON BINDING CAPACITY (CC-WL,HP ONLY)
Iron: 190 ug/dL — ABNORMAL HIGH (ref 28–170)
Saturation Ratios: 41 % — ABNORMAL HIGH (ref 10.4–31.8)
TIBC: 469 ug/dL — ABNORMAL HIGH (ref 250–450)
UIBC: 279 ug/dL (ref 148–442)

## 2023-05-10 LAB — RETICULOCYTES
Immature Retic Fract: 5.6 % (ref 2.3–15.9)
RBC.: 4.92 MIL/uL (ref 3.87–5.11)
Retic Count, Absolute: 67.9 10*3/uL (ref 19.0–186.0)
Retic Ct Pct: 1.4 % (ref 0.4–3.1)

## 2023-05-10 LAB — FERRITIN: Ferritin: 24 ng/mL (ref 11–307)

## 2023-05-10 LAB — VITAMIN B12: Vitamin B-12: 451 pg/mL (ref 180–914)

## 2023-05-10 NOTE — Progress Notes (Signed)
Hematology and Oncology Follow Up Visit  Emily [name] [id number] 22-Dec-1975 47 y.o. 05/10/2023   Principle Diagnosis:  Iron deficiency anemia secondary to malabsorption s/p gastric bypass in 2007.    Current Therapy:        IV iron as indicated  B 12 injections weekly - prescribed by PCP   Interim History:  Emily [name] is here today for follow-up. She is doing quite well and has no complaints at this time.  No issues with abnormal blood loss. No bruising or petechiae.  No fever, chills, n/v, cough, rash, dizziness, SOB, chest pain, palpitations, abdominal pain or changes in bowel or bladder habits.  No swelling or tenderness in her extremities.  She has tingling and numbness in her left foot since prior nerve block.  No falls or syncope reported.  Appetite and hydration are good. Weight is stable at 195 lbs.   ECOG Performance Status: 0 - Asymptomatic  Medications:  Allergies as of 05/10/2023       Reactions   Almond Oil Swelling   Apple Juice Swelling   Kiwi Extract Swelling   Soy Allergy Itching, Swelling   Mouth/ tongue itch   Strawberry Extract Itching   Mouth/ tongue itch        Medication List        Accurate as of May 10, 2023 10:05 AM. If you have any questions, ask your nurse or doctor.          Blisovi Fe 1.5/30 1.5-30 MG-MCG tablet Generic drug: norethindrone-ethinyl estradiol-iron Take 1 tablet by mouth daily. Skip placebo and take continuously   buPROPion 150 MG 24 hr tablet Commonly known as: WELLBUTRIN XL Take 1 tablet (150 mg total) by mouth daily.   cetirizine 10 MG tablet Commonly known as: ZYRTEC Take 10 mg by mouth daily.   clobetasol 0.05 % external solution Commonly known as: TEMOVATE Apply 1 application topically 2 (two) times daily.   diltiazem 120 MG 24 hr capsule Commonly known as: Cardizem CD Take 1 capsule (120 mg total) by mouth daily.   docusate sodium 100 MG capsule Commonly known as: COLACE Take 100 mg by mouth  every other day.   folic acid 1 MG tablet Commonly known as: FOLVITE Take 1 mg by mouth daily.   furosemide 20 MG tablet Commonly known as: LASIX Take 1 tablet (20 mg total) by mouth daily.   gabapentin 100 MG capsule Commonly known as: NEURONTIN Take 1 capsule (100 mg total) by mouth 3 (three) times daily.   Iron 325 (65 Fe) MG Tabs Take 1 tablet (325 mg total) by mouth every other day.   ketoconazole 2 % shampoo Commonly known as: NIZORAL Apply 1 application. topically daily.   nitrofurantoin (macrocrystal-monohydrate) 100 MG capsule Commonly known as: Macrobid Take 1 capsule (100 mg total) by mouth 2 (two) times daily.   SUPER B COMPLEX PO Take 1 tablet by mouth daily.   Zepbound 12.5 MG/0.5ML Pen Generic drug: tirzepatide Inject 12.5 mg into the skin once a week.        Allergies:  Allergies  Allergen Reactions   Almond Oil Swelling   Apple Juice Swelling   Kiwi Extract Swelling   Soy Allergy Itching and Swelling    Mouth/ tongue itch   Strawberry Extract Itching    Mouth/ tongue itch    Past Medical History, Surgical history, Social history, and Family History were reviewed and updated.  Review of Systems: All other 10 point review of systems is negative.  Physical Exam:  weight is 195 lb 1.3 oz (88.5 kg). Her oral temperature is 98.3 F (36.8 C). Her blood pressure is 101/47 (abnormal) and her pulse is 84. Her respiration is 17 and oxygen saturation is 100%.   Wt Readings from Last 3 Encounters:  05/10/23 195 lb 1.3 oz (88.5 kg)  01/12/23 202 lb (91.6 kg)  10/28/22 202 lb 1.9 oz (91.7 kg)    Ocular: Sclerae unicteric, pupils equal, round and reactive to light Ear-nose-throat: Oropharynx clear, dentition fair Lymphatic: No cervical or supraclavicular adenopathy Lungs no rales or rhonchi, good excursion bilaterally Heart regular rate and rhythm, no murmur appreciated Abd soft, nontender, positive bowel sounds MSK no focal spinal tenderness, no  joint edema Neuro: non-focal, well-oriented, appropriate affect Breasts: Deferred   Lab Results  Component Value Date   WBC 7.1 05/10/2023   HGB 14.4 05/10/2023   HCT 44.6 05/10/2023   MCV 90.5 05/10/2023   PLT 314 05/10/2023   Lab Results  Component Value Date   FERRITIN 26 01/27/2023   IRON 125 01/27/2023   TIBC 463 (H) 01/27/2023   UIBC 338 01/27/2023   IRONPCTSAT 27 01/27/2023   Lab Results  Component Value Date   RETICCTPCT 1.4 05/10/2023   RBC 4.92 05/10/2023   No results found for: "KPAFRELGTCHN", "LAMBDASER", "KAPLAMBRATIO" No results found for: "IGGSERUM", "IGA", "IGMSERUM" No results found for: "TOTALPROTELP", "ALBUMINELP", "A1GS", "A2GS", "BETS", "BETA2SER", "GAMS", "MSPIKE", "SPEI"   Chemistry      Component Value Date/Time   NA 137 11/06/2022 0817   NA 142 02/07/2018 0812   K 4.4 11/06/2022 0817   CL 105 11/06/2022 0817   CO2 26 11/06/2022 0817   BUN 13 11/06/2022 0817   BUN 15 02/07/2018 0812   CREATININE 0.61 11/06/2022 0817   CREATININE 0.84 10/28/2022 1016      Component Value Date/Time   CALCIUM 8.6 11/06/2022 0817   ALKPHOS 62 11/06/2022 0817   AST 21 11/06/2022 0817   AST 16 10/28/2022 1016   ALT 19 11/06/2022 0817   ALT 15 10/28/2022 1016   BILITOT 0.6 11/06/2022 0817   BILITOT 1.1 10/28/2022 1016       Impression and Plan: Emily [name] is a very pleasant 47 yo caucasian female with iron deficiency anemia secondary to malabsorption s/p gastric bypass in 2007.  Iron studies are pending. We will replace if needed.  Follow-up in 6 months.   Eileen Stanford, NP 8/5/202410:05 AM

## 2023-05-12 ENCOUNTER — Encounter: Payer: 59 | Admitting: Surgical

## 2023-05-17 ENCOUNTER — Ambulatory Visit (INDEPENDENT_AMBULATORY_CARE_PROVIDER_SITE_OTHER): Payer: 59 | Admitting: Family Medicine

## 2023-05-17 ENCOUNTER — Other Ambulatory Visit (HOSPITAL_BASED_OUTPATIENT_CLINIC_OR_DEPARTMENT_OTHER): Payer: Self-pay

## 2023-05-17 VITALS — BP 106/47 | HR 78 | Temp 98.0°F | Ht 64.0 in | Wt 196.0 lb

## 2023-05-17 DIAGNOSIS — J039 Acute tonsillitis, unspecified: Secondary | ICD-10-CM | POA: Diagnosis not present

## 2023-05-17 MED ORDER — PREDNISONE 20 MG PO TABS
40.0000 mg | ORAL_TABLET | Freq: Every day | ORAL | 0 refills | Status: AC
Start: 2023-05-17 — End: 2023-05-22
  Filled 2023-05-17: qty 10, 5d supply, fill #0

## 2023-05-17 MED ORDER — AMOXICILLIN-POT CLAVULANATE 875-125 MG PO TABS
1.0000 | ORAL_TABLET | Freq: Two times a day (BID) | ORAL | 0 refills | Status: AC
Start: 2023-05-17 — End: 2023-05-24
  Filled 2023-05-17: qty 14, 7d supply, fill #0

## 2023-05-17 NOTE — Progress Notes (Signed)
Acute Office Visit  Subjective:     Patient ID: Emily [name], female    DOB: [birth year], 47 y.o.   MRN: [id number]  Chief Complaint  Patient presents with   Sore Throat     Patient is in today for sore throat.   Discussed the use of AI scribe software for clinical note transcription with the patient, who gave verbal consent to proceed.  History of Present Illness   The patient self-presented with a chief complaint of a sore throat persisting for approximately one week. They noticed a pustule-like formation on the right tonsil, which they initially suspected to be a tonsil stone. However, the patient reported that the formation was painful and appeared larger than typical tonsil stones they had experienced in the past.  The patient attempted to manually manipulate the formation but was unable to due to a gag reflex. They reported no change in pain level upon touching the formation. The patient denied any associated symptoms such as fever, chills, body aches, or exposure to sick contacts.  The patient also reported a sensation of something lodged in their throat, causing discomfort, particularly noticeable during swallowing. However, they denied any difficulty in swallowing.           ROS All review of systems negative except what is listed in the HPI      Objective:    BP (!) 106/47   Pulse 78   Temp 98 F (36.7 C) (Oral)   Ht 5\' 4"  (1.626 m)   Wt 196 lb (88.9 kg)   SpO2 100%   BMI 33.64 kg/m    Physical Exam Vitals reviewed.  Constitutional:      Appearance: She is well-developed.  HENT:     Head: Normocephalic and atraumatic.     Mouth/Throat:     Tonsils: 2+ on the right. 0 on the left.   Musculoskeletal:     Cervical back: Normal range of motion and neck supple.  Lymphadenopathy:     Cervical: No cervical adenopathy.  Skin:    General: Skin is warm and dry.  Neurological:     General: No focal deficit present.     Mental Status: She is alert  and oriented to person, place, and time.  Psychiatric:        Mood and Affect: Mood normal.        Behavior: Behavior normal.     No results found for any visits on 05/17/23.      Assessment & Plan:   Problem List Items Addressed This Visit   None Visit Diagnoses     Tonsillitis with exudate    -  Primary Trial antibiotics and prednisone.  If not improving, refer to ENT to further evaluate    Relevant Medications   amoxicillin-clavulanate (AUGMENTIN) 875-125 MG tablet   predniSONE (DELTASONE) 20 MG tablet       Meds ordered this encounter  Medications   amoxicillin-clavulanate (AUGMENTIN) 875-125 MG tablet    Sig: Take 1 tablet by mouth 2 (two) times daily for 7 days.    Dispense:  14 tablet    Refill:  0    Order Specific Question:   Supervising Provider    Answer:   Danise Edge A [[id number]]   predniSONE (DELTASONE) 20 MG tablet    Sig: Take 2 tablets (40 mg total) by mouth daily with breakfast for 5 days.    Dispense:  10 tablet    Refill:  0    Order Specific  Question:   Supervising Provider    Answer:   Bradd Canary [[id number]]    Return if symptoms worsen or fail to improve.  Clayborne Dana, NP

## 2023-05-19 ENCOUNTER — Ambulatory Visit (INDEPENDENT_AMBULATORY_CARE_PROVIDER_SITE_OTHER): Payer: Self-pay | Admitting: Surgical

## 2023-05-19 DIAGNOSIS — Z411 Encounter for cosmetic surgery: Secondary | ICD-10-CM

## 2023-05-19 NOTE — Progress Notes (Signed)
Botulinum Toxin Procedure Note  Procedure: Cosmetic botulinum toxin  Pre-operative Diagnosis: Dynamic rhytides  Post-operative Diagnosis: Same  Complications:  None  Brief history: The patient desires botulinum toxin injection.  She is aware of the risks including bleeding, damage to deeper structures, asymmetry, brow ptosis, eyelid ptosis, bruising. The patient understands and wishes to proceed.  Procedure: The area was prepped with alcohol and dried with a clean gauze.  Using a clean technique the botulinum toxin was diluted with 2.5 mL of bacteriostatic saline per 100 unit vial which resulted in 4 units per 0.1 mL.  Subsequently the mixture was injected in the glabellar, lateral canthal lines, forehead area with preservation of the temporal branch to the lateral eyebrow. A total of 32 Units of botulinum toxin was used. The forehead and glabellar area was injected with care to inject intramuscular only while holding pressure on the supratrochlear vessels in each area during each injection on either side of the medial corrugators. The injection proceeded vertically superiorly to the medial 2/3 of the frontalis muscle and superior 2/3 of the lateral frontalis, again with preservation of the frontal branch.  No complications were noted. Light pressure was held. She was instructed explicitly in post-operative care.  Botox LOT:  [id number] EXP:  03/2025

## 2023-05-20 ENCOUNTER — Encounter (INDEPENDENT_AMBULATORY_CARE_PROVIDER_SITE_OTHER): Payer: Self-pay

## 2023-05-26 ENCOUNTER — Other Ambulatory Visit: Payer: Self-pay | Admitting: Oncology

## 2023-05-26 DIAGNOSIS — Z006 Encounter for examination for normal comparison and control in clinical research program: Secondary | ICD-10-CM

## 2023-06-08 ENCOUNTER — Other Ambulatory Visit (HOSPITAL_BASED_OUTPATIENT_CLINIC_OR_DEPARTMENT_OTHER): Payer: Self-pay

## 2023-06-08 ENCOUNTER — Other Ambulatory Visit: Payer: Self-pay

## 2023-06-08 MED ORDER — VALACYCLOVIR HCL 1 G PO TABS
2000.0000 mg | ORAL_TABLET | Freq: Two times a day (BID) | ORAL | 4 refills | Status: AC
  Filled 2023-06-08: qty 12, 3d supply, fill #0
  Filled 2023-07-21: qty 12, 3d supply, fill #1

## 2023-06-08 MED ORDER — BUPROPION HCL ER (XL) 150 MG PO TB24
150.0000 mg | ORAL_TABLET | Freq: Every day | ORAL | 1 refills | Status: DC
  Filled 2023-06-08 – 2023-06-15 (×3): qty 90, 90d supply, fill #0
  Filled 2023-09-21: qty 90, 90d supply, fill #1

## 2023-06-08 MED ORDER — FUROSEMIDE 20 MG PO TABS
20.0000 mg | ORAL_TABLET | Freq: Every day | ORAL | 3 refills | Status: DC
  Filled 2023-06-08: qty 30, 30d supply, fill #0
  Filled 2023-07-21: qty 30, 30d supply, fill #1
  Filled 2023-08-15: qty 30, 30d supply, fill #2
  Filled 2023-10-10: qty 30, 30d supply, fill #3

## 2023-06-08 NOTE — Telephone Encounter (Signed)
Medications refill requested

## 2023-06-10 ENCOUNTER — Other Ambulatory Visit (HOSPITAL_BASED_OUTPATIENT_CLINIC_OR_DEPARTMENT_OTHER): Payer: Self-pay

## 2023-06-15 ENCOUNTER — Other Ambulatory Visit (HOSPITAL_BASED_OUTPATIENT_CLINIC_OR_DEPARTMENT_OTHER): Payer: Self-pay

## 2023-06-22 ENCOUNTER — Encounter: Payer: Self-pay | Admitting: Family

## 2023-06-22 ENCOUNTER — Other Ambulatory Visit (HOSPITAL_BASED_OUTPATIENT_CLINIC_OR_DEPARTMENT_OTHER): Payer: Self-pay

## 2023-06-22 ENCOUNTER — Ambulatory Visit (INDEPENDENT_AMBULATORY_CARE_PROVIDER_SITE_OTHER): Payer: 59 | Admitting: Family

## 2023-06-22 VITALS — BP 108/54 | HR 65 | Temp 98.0°F | Resp 16 | Ht 64.0 in | Wt 198.0 lb

## 2023-06-22 DIAGNOSIS — E538 Deficiency of other specified B group vitamins: Secondary | ICD-10-CM

## 2023-06-22 DIAGNOSIS — Z91018 Allergy to other foods: Secondary | ICD-10-CM | POA: Diagnosis not present

## 2023-06-22 DIAGNOSIS — E785 Hyperlipidemia, unspecified: Secondary | ICD-10-CM

## 2023-06-22 DIAGNOSIS — F3289 Other specified depressive episodes: Secondary | ICD-10-CM | POA: Diagnosis not present

## 2023-06-22 DIAGNOSIS — E559 Vitamin D deficiency, unspecified: Secondary | ICD-10-CM

## 2023-06-22 DIAGNOSIS — Z0000 Encounter for general adult medical examination without abnormal findings: Secondary | ICD-10-CM

## 2023-06-22 DIAGNOSIS — Z1231 Encounter for screening mammogram for malignant neoplasm of breast: Secondary | ICD-10-CM | POA: Diagnosis not present

## 2023-06-22 LAB — COMPREHENSIVE METABOLIC PANEL
ALT: 20 U/L (ref 0–35)
AST: 18 U/L (ref 0–37)
Albumin: 3.6 g/dL (ref 3.5–5.2)
Alkaline Phosphatase: 72 U/L (ref 39–117)
BUN: 15 mg/dL (ref 6–23)
CO2: 27 meq/L (ref 19–32)
Calcium: 8.7 mg/dL (ref 8.4–10.5)
Chloride: 104 meq/L (ref 96–112)
Creatinine, Ser: 0.69 mg/dL (ref 0.40–1.20)
GFR: 101.85 mL/min (ref >60.00)
Glucose, Bld: 81 mg/dL (ref 70–99)
Potassium: 4.5 meq/L (ref 3.5–5.1)
Sodium: 140 meq/L (ref 135–145)
Total Bilirubin: 1.1 mg/dL (ref 0.2–1.2)
Total Protein: 6.4 g/dL (ref 6.0–8.3)

## 2023-06-22 LAB — LIPID PANEL
Cholesterol: 191 mg/dL (ref 0–200)
HDL: 104.6 mg/dL (ref >39.00)
LDL Cholesterol: 70 mg/dL (ref 0–99)
NonHDL: 86.66
Total CHOL/HDL Ratio: 2
Triglycerides: 83 mg/dL (ref 0.0–149.0)
VLDL: 16.6 mg/dL (ref 0.0–40.0)

## 2023-06-22 LAB — VITAMIN D 25 HYDROXY (VIT D DEFICIENCY, FRACTURES): VITD: 33.08 ng/mL (ref 30.00–100.00)

## 2023-06-22 MED ORDER — CYANOCOBALAMIN 1000 MCG/ML IJ SOLN
1000.0000 ug | INTRAMUSCULAR | 4 refills | Status: DC
Start: 2023-06-22 — End: 2024-08-08
  Filled 2023-06-22: qty 12, 84d supply, fill #0
  Filled 2023-09-10: qty 12, 84d supply, fill #1
  Filled 2023-12-03: qty 12, 84d supply, fill #2
  Filled 2024-02-25: qty 12, 84d supply, fill #3
  Filled 2024-05-16: qty 12, 84d supply, fill #4

## 2023-06-22 MED ORDER — EPINEPHRINE 0.3 MG/0.3ML IJ SOAJ
0.3000 mg | INTRAMUSCULAR | 1 refills | Status: AC | PRN
Start: 2023-06-22 — End: ?
  Filled 2023-06-22: qty 2, 2d supply, fill #0

## 2023-06-22 NOTE — Assessment & Plan Note (Signed)
Continue healthy diet, exercise and weight loss efforts. She has done very well maintaining her weight after coming off of mounjaro. Pap up to date and being managed by GYN.  She will update mammogram in November.  Colo is up to date.

## 2023-06-22 NOTE — Assessment & Plan Note (Signed)
Not currently on supplement. Will update vitamin D level.

## 2023-06-22 NOTE — Assessment & Plan Note (Signed)
Will rx Epipen to have on hand.

## 2023-06-22 NOTE — Assessment & Plan Note (Signed)
Stable on wellbutrin.

## 2023-06-22 NOTE — Assessment & Plan Note (Signed)
Continues weekly injections at home.

## 2023-06-22 NOTE — Progress Notes (Signed)
Subjective:     Patient ID: Catalea Bouvia, female    DOB: 20-Jan-1976, 47 y.o.   MRN: [id number]  Chief Complaint  Patient presents with  . Annual Exam    HPI  Discussed the use of AI scribe software for clinical note transcription with the patient, who gave verbal consent to proceed.  History of Present Illness   The patient, with a history of weight fluctuations, reports maintaining their weight despite discontinuing Mounjaro. She attributes this stability to regular gym workouts. She also mentions a need for a refill of vitamin B12 injections, which she administers at home once a week. The patient reports that her mood is stable on Wellbutrin and she has no suicidal ideation. She continues to experience foot pain, which is managed with gabapentin taken twice daily. The patient also has psoriasis on her scalp, which is managed with clobetasol. She recently had a colonoscopy, which was clear, and a mammogram is scheduled for November. The patient also mentions a recent prescription for Valtrex for cold sores which was effective.     Immunizations: flu shot due Diet:  healthy Exercise: started going to the gym regularly.  Colonoscopy: 2023 Pap Smear: 2022 Mammogram: due November Vision: up to date Dental: up to date   Wt Readings from Last 3 Encounters:  06/22/23 198 lb (89.8 kg)  05/17/23 196 lb (88.9 kg)  05/10/23 195 lb 1.3 oz (88.5 kg)       Health Maintenance Due  Topic Date Due  . INFLUENZA VACCINE  05/06/2023  . COVID-19 Vaccine (5 - 2023-24 season) 06/06/2023    Past Medical History:  Diagnosis Date  . B12 deficiency   . Benign paroxysmal positional vertigo of right ear 07/15/2021  . Depression   . Heart murmur   . History of COVID-19 2020   per pt mild symptoms that resolved  . Hypermetropia of both eyes 09/12/2014  . IDA (iron deficiency anemia)   . Impingement syndrome of right shoulder 05/21/2015  . Impingement syndrome of right shoulder 05/21/2015   . Lactose intolerance    per pt due to gastric bypass  . Morbid obesity (HCC) 08/15/2017  . Nevus of choroid of left eye 09/12/2014   being monitored  . Palpitations    due ot SVT  . Psoriasis   . Seborrheic dermatitis 02/08/2015  . Status post gastric bypass for obesity 12/08/2016  . SVT (supraventricular tachycardia)    cardiologist--- dr Townsend Roger;    normal echo in epic 02-17-2021 and event monitor 02-04-2021  symptomatic SVT w/ 5 brief episodes the longest 14 beats  . Vitamin D deficiency   . Wears glasses     Past Surgical History:  Procedure Laterality Date  . COLPOSCOPY  2008  . ENDOMETRIAL BIOPSY  2017   negative per pt  . FOOT SURGERY Left 12/17/2021  . LAPAROSCOPIC CHOLECYSTECTOMY  2006  . MASS EXCISION Left 08/17/2017   Procedure: EXCISION SOFT TISSUE  MASS LEFT FLANK;  Surgeon: Darnell Level, MD;  Location:  [facility];  Service: General;  Laterality: Left;  . ROUX-EN-Y GASTRIC BYPASS  2007  . TENDON REPAIR Left 12/17/2021   Procedure: TENDON REPAIR OF LEFT FOOT;  Surgeon: Vivi Barrack, DPM;  Location: [facility];  Service: Podiatry;  Laterality: Left;  GENERAL WITH BLOCK    Family History  Problem Relation Age of Onset  . Hypertension Mother   . Obesity Mother   . Hypertension Father   . Sleep apnea Father   .  Obesity Father   . Colon polyps Father   . ADD / ADHD Sister   . Alzheimer's disease Maternal Grandmother   . Leukemia Maternal Grandfather   . Alzheimer's disease Paternal Grandmother   . Cancer Paternal Grandfather   . Cancer - Other Paternal Grandfather   . Diabetes type II Maternal Aunt   . Stroke Maternal Aunt   . Multiple sclerosis Maternal Aunt   . Heart attack Paternal Aunt   . Colon cancer Neg Hx   . Rectal cancer Neg Hx   . Stomach cancer Neg Hx   . Esophageal cancer Neg Hx     Social History   Socioeconomic History  . Marital status: Single    Spouse name: Not on file  . Number of children:  Not on file  . Years of education: Not on file  . Highest education level: Master's degree (e.g., MA, MS, MEng, MEd, MSW, MBA)  Occupational History  . Occupation: Teacher, adult education: [employer]  Tobacco Use  . Smoking status: Never    Passive exposure: Never  . Smokeless tobacco: Never  Vaping Use  . Vaping status: Never Used  Substance and Sexual Activity  . Alcohol use: Not Currently    Alcohol/week: 5.0 standard drinks of alcohol    Types: 5 Cans of beer per week  . Drug use: Never  . Sexual activity: Yes    Birth control/protection: Pill  Other Topics Concern  . Not on file  Social History Narrative   Works as an Charity fundraiser for [employer]   No children   Not married   Has a dog named Doctor, general practice   From [place]- moved to [place] in [place]   Parents are both living   Has one sister back home   Enjoys going out to eat with friends   Enjoys biking and kayaking.    Goes to the GYM 6 days a week   Social Determinants of Health   Financial Resource Strain: Low Risk  (05/17/2023)   Overall Financial Resource Strain (CARDIA)   . Difficulty of Paying Living Expenses: Not hard at all  Food Insecurity: No Food Insecurity (05/17/2023)   Hunger Vital Sign   . Worried About Programme researcher, broadcasting/film/video in the Last Year: Never true   . Ran Out of Food in the Last Year: Never true  Transportation Needs: No Transportation Needs (05/17/2023)   PRAPARE - Transportation   . Lack of Transportation (Medical): No   . Lack of Transportation (Non-Medical): No  Physical Activity: Sufficiently Active (05/17/2023)   Exercise Vital Sign   . Days of Exercise per Week: 4 days   . Minutes of Exercise per Session: 60 min  Stress: No Stress Concern Present (05/17/2023)   [employer] of Occupational Health - Occupational Stress Questionnaire   . Feeling of Stress : Only a little  Social Connections: Socially Isolated (05/17/2023)   Social Connection and Isolation Panel [NHANES]   . Frequency of Communication with  Friends and Family: More than three times a week   . Frequency of Social Gatherings with Friends and Family: More than three times a week   . Attends Religious Services: Never   . Active Member of Clubs or Organizations: No   . Attends Banker Meetings: Not on file   . Marital Status: Never married  Intimate Partner Violence: Not on file    Outpatient Medications Prior to Visit  Medication Sig Dispense Refill  . B  Complex-C (SUPER B COMPLEX PO) Take 1 tablet by mouth daily.    Marland Kitchen buPROPion (WELLBUTRIN XL) 150 MG 24 hr tablet Take 1 tablet (150 mg total) by mouth daily. 90 tablet 1  . cetirizine (ZYRTEC) 10 MG tablet Take 10 mg by mouth daily.    . clobetasol (TEMOVATE) 0.05 % external solution Apply 1 application topically 2 (two) times daily. 50 mL 1  . diltiazem (CARDIZEM CD) 120 MG 24 hr capsule Take 1 capsule (120 mg total) by mouth daily. 90 capsule 3  . docusate sodium (COLACE) 100 MG capsule Take 100 mg by mouth every other day.    . Ferrous Sulfate (IRON) 325 (65 Fe) MG TABS Take 1 tablet (325 mg total) by mouth every other day. 30 tablet 0  . folic acid (FOLVITE) 1 MG tablet Take 1 mg by mouth daily.    . furosemide (LASIX) 20 MG tablet Take 1 tablet (20 mg total) by mouth daily. 30 tablet 3  . gabapentin (NEURONTIN) 100 MG capsule Take 1 capsule (100 mg total) by mouth 3 (three) times daily. 90 capsule 1  . ketoconazole (NIZORAL) 2 % shampoo Apply 1 application. topically daily.    . norethindrone-ethinyl estradiol-iron (MICROGESTIN FE 1.5/30) 1.5-30 MG-MCG tablet Take 1 tablet by mouth daily. Skip placebo and take continuously 84 tablet 5  . valACYclovir (VALTREX) 1000 MG tablet Take 2 tablets (2,000 mg total) by mouth now and 2 tablets (2,000 mg total) by mouth in 12 (twelve) hours for cold sore. 12 tablet 4   No facility-administered medications prior to visit.    Allergies  Allergen Reactions  . Almond Oil Swelling  . Apple Juice Swelling  . Kiwi Extract  Swelling  . Soy Allergy Itching and Swelling    Mouth/ tongue itch  . Strawberry Extract Itching    Mouth/ tongue itch    Review of Systems  Constitutional:  Negative for weight loss.  HENT:  Negative for congestion and hearing loss.   Eyes:  Negative for blurred vision.  Respiratory:  Negative for cough.   Cardiovascular:  Positive for leg swelling (occasional, uses lasix prn about 2 x a week).  Gastrointestinal:  Negative for constipation and diarrhea.  Genitourinary:  Negative for dysuria and frequency.  Musculoskeletal:  Negative for back pain, joint pain and myalgias.  Skin:  Positive for rash (scalp psoriasis treated with clobetasol from derm).  Neurological:  Negative for headaches.  Psychiatric/Behavioral:         Stable mood       Objective:    Physical Exam   BP (!) 108/54 (BP Location: Right Arm, Patient Position: Sitting, Cuff Size: Small)   Pulse 65   Temp 98 F (36.7 C) (Oral)   Resp 16   Ht 5\' 4"  (1.626 m)   Wt 198 lb (89.8 kg)   SpO2 100%   BMI 33.99 kg/m  Wt Readings from Last 3 Encounters:  06/22/23 198 lb (89.8 kg)  05/17/23 196 lb (88.9 kg)  05/10/23 195 lb 1.3 oz (88.5 kg)   Physical Exam  Constitutional: She is oriented to person, place, and time. She appears well-developed and well-nourished. No distress.  HENT:  Head: Normocephalic and atraumatic.  Right Ear: Tympanic membrane and ear canal normal.  Left Ear: Tympanic membrane and ear canal normal.  Mouth/Throat: Oropharynx is clear and moist.  Eyes: Pupils are equal, round, and reactive to light. No scleral icterus.  Neck: Normal range of motion. No thyromegaly present.  Cardiovascular: Normal rate  and regular rhythm.   No murmur heard. Pulmonary/Chest: Effort normal and breath sounds normal. No respiratory distress. He has no wheezes. She has no rales. She exhibits no tenderness.  Abdominal: Soft. Bowel sounds are normal. She exhibits no distension and no mass. There is no tenderness.  There is no rebound and no guarding.  Musculoskeletal: She exhibits no edema.  Lymphadenopathy:    She has no cervical adenopathy.  Neurological: She is alert and oriented to person, place, and time. She has normal patellar reflexes. She exhibits normal muscle tone. Coordination normal.  Skin: Skin is warm and dry.  Psychiatric: She has a normal mood and affect. Her behavior is normal. Judgment and thought content normal.  Breast/pelvic: deferred           Assessment & Plan:       Assessment & Plan:   Problem List Items Addressed This Visit       Unprioritized   Vitamin D deficiency    Not currently on supplement. Will update vitamin D level.       Relevant Orders   Vitamin D (25 hydroxy)   Vitamin B 12 deficiency   Relevant Medications   cyanocobalamin (VITAMIN B12) 1000 MCG/ML injection   Preventative health care - Primary    Continue healthy diet, exercise and weight loss efforts. She has done very well maintaining her weight after coming off of mounjaro. Pap up to date and being managed by GYN.  She will update mammogram in November.  Colo is up to date.       Multiple food allergies    Will rx Epipen to have on hand.      Depression    Stable on wellbutrin.       B12 deficiency    Continues weekly injections at home.       Other Visit Diagnoses     Breast cancer screening by mammogram       Relevant Orders   MM 3D SCREENING MAMMOGRAM BILATERAL BREAST   Hyperlipidemia, unspecified hyperlipidemia type       Relevant Medications   EPINEPHrine (EPIPEN 2-PAK) 0.3 mg/0.3 mL IJ SOAJ injection   Other Relevant Orders   Comp Met (CMET)   Lipid panel       I am having Ashok Norris "Kim" start on EPINEPHrine and cyanocobalamin. I am also having her maintain her folic acid, docusate sodium, ketoconazole, Iron, cetirizine, B Complex-C (SUPER B COMPLEX PO), norethindrone-ethinyl estradiol-iron, clobetasol, diltiazem, gabapentin, buPROPion, furosemide, and  valACYclovir.  Meds ordered this encounter  Medications  . EPINEPHrine (EPIPEN 2-PAK) 0.3 mg/0.3 mL IJ SOAJ injection    Sig: Inject 0.3 mg into the muscle as needed for anaphylaxis.    Dispense:  2 each    Refill:  1    Order Specific Question:   Supervising Provider    Answer:   Danise Edge A [[id number]]  . cyanocobalamin (VITAMIN B12) 1000 MCG/ML injection    Sig: Inject 1 mL (1,000 mcg total) into the muscle once a week.    Dispense:  12 mL    Refill:  4    Order Specific Question:   Supervising Provider    Answer:   Danise Edge A [[id number]]

## 2023-06-22 NOTE — Patient Instructions (Signed)
VISIT SUMMARY:  During your recent visit, we discussed your ongoing health conditions and your current treatment plans. You've been managing your weight well with regular gym activities and have seen improvements in your iron deficiency anemia with weekly B12 injections. Your foot pain is under control with gabapentin, and your cold sores have been managed effectively with Valtrex. We also discussed your use of diltiazem for supraventricular tachycardia and Loestrin for birth control.  YOUR PLAN:  -WEIGHT MANAGEMENT: You've been doing well with your current exercise regimen and healthy eating habits. Please continue these practices.  -IRON DEFICIENCY ANEMIA: Your blood count has improved with weekly B12 injections and IV iron per Hematology. . We will refill your B12 injections for home use. Please continue taking these injections once a week.  -HYPERLIPIDEMIA: Your cholesterol was last checked in 2022 and was previously elevated. We will repeat the lipid panel to monitor your cholesterol levels.  -VITAMIN D DEFICIENCY: Your Vitamin D level was last checked a year ago. We will repeat the Vitamin D level test to ensure you're getting enough of this important nutrient.  -PSORIASIS: You've been managing your psoriasis with Clobetasol. Please continue using Clobetasol as needed.  -PERIPHERAL EDEMA: You've been managing occasional swelling with Lasix. Please continue using Lasix as needed.  -SUPRAVENTRICULAR TACHYCARDIA: You've been managing your supraventricular tachycardia with Diltiazem. Please continue taking Diltiazem.  INSTRUCTIONS:  For your general health maintenance, please remember to get your flu shot this week. The COVID booster is available downstairs. We will discuss the Shingrix vaccine after your upcoming birthday. Your next colonoscopy is due in 2031. Please schedule your Pap smear, as it is due now. Your next mammogram is due in November 2024. Your dental and vision check-ups are  up to date. We will order an EpiPen due to your food allergies. We will also check your A1C and CMP.

## 2023-06-23 IMAGING — DX DG FOOT COMPLETE 3+V*L*
3 series · 3 of 3 positions shown · non-contrast
Comparison: None.

CLINICAL DATA: Three months of lateral foot pain

EXAM:
LEFT FOOT - COMPLETE 3+ VIEW

[foot ap]
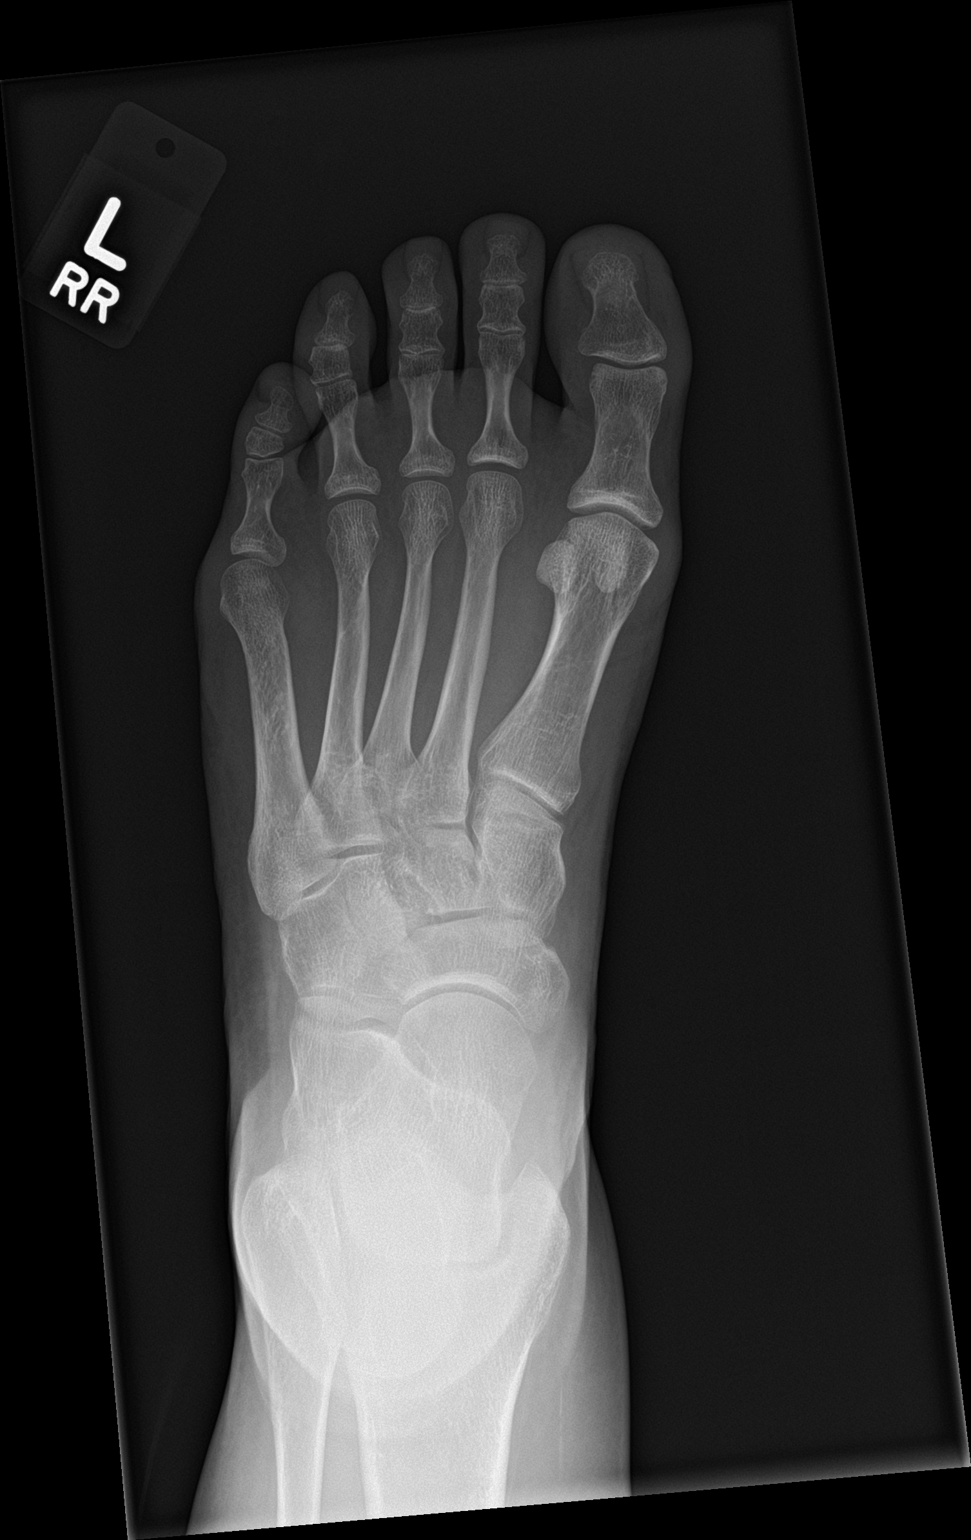

[foot obl]
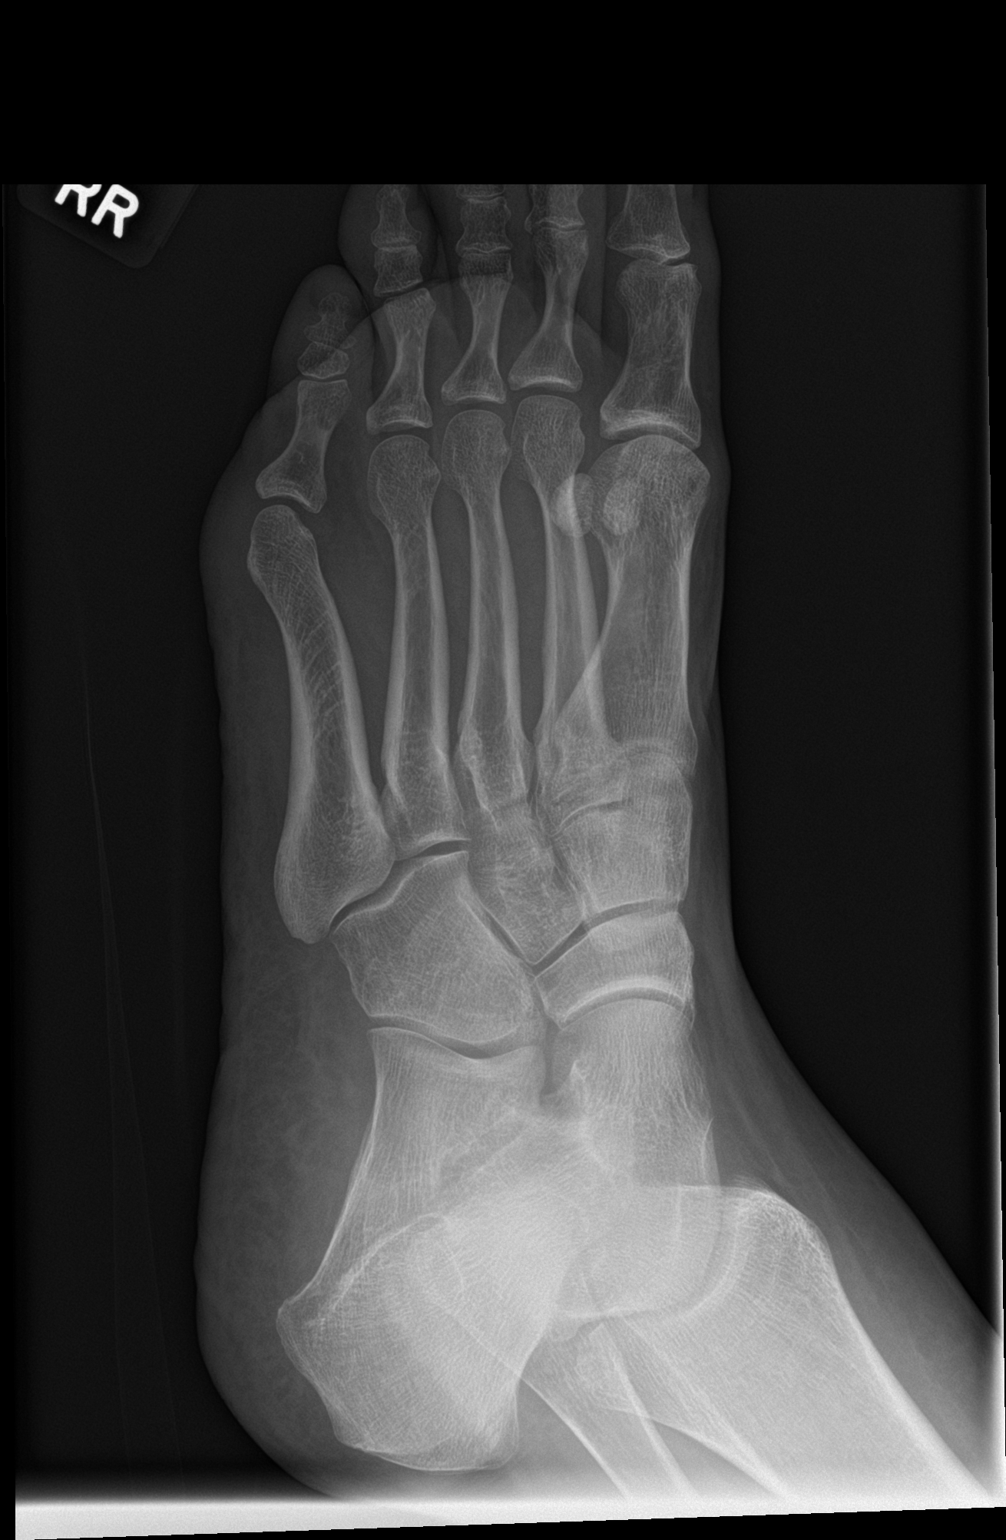

[foot lat]
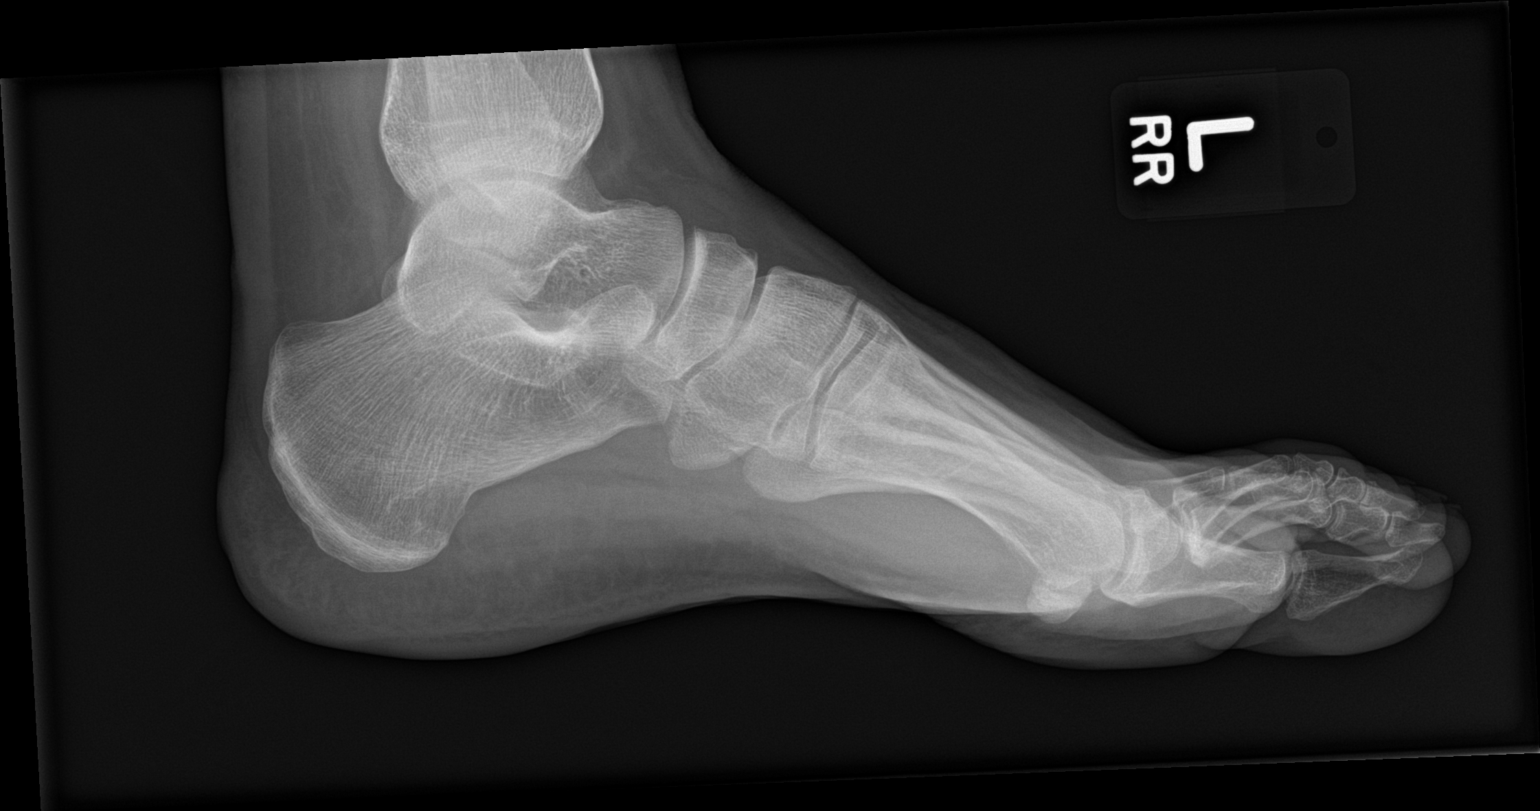

[3 of 3 positions shown; findings below may reference images not displayed]

FINDINGS: There is no evidence of fracture or dislocation. There is no
evidence of arthropathy or other focal bone abnormality. Soft
tissues are unremarkable.
IMPRESSION: No acute osseous abnormality.

## 2023-06-24 ENCOUNTER — Telehealth (HOSPITAL_BASED_OUTPATIENT_CLINIC_OR_DEPARTMENT_OTHER): Payer: Self-pay

## 2023-07-08 ENCOUNTER — Telehealth (HOSPITAL_BASED_OUTPATIENT_CLINIC_OR_DEPARTMENT_OTHER): Payer: Self-pay

## 2023-07-21 ENCOUNTER — Other Ambulatory Visit: Payer: Self-pay | Admitting: Family

## 2023-07-21 ENCOUNTER — Other Ambulatory Visit: Payer: Self-pay

## 2023-07-21 ENCOUNTER — Other Ambulatory Visit (HOSPITAL_BASED_OUTPATIENT_CLINIC_OR_DEPARTMENT_OTHER): Payer: Self-pay

## 2023-07-21 MED ORDER — GABAPENTIN 100 MG PO CAPS
100.0000 mg | ORAL_CAPSULE | Freq: Three times a day (TID) | ORAL | 5 refills | Status: DC
Start: 2023-07-21 — End: 2024-06-07
  Filled 2023-07-21: qty 90, 30d supply, fill #0
  Filled 2023-08-15: qty 90, 30d supply, fill #1
  Filled 2023-12-03: qty 90, 30d supply, fill #2
  Filled 2024-01-17: qty 90, 30d supply, fill #3
  Filled 2024-02-25: qty 90, 30d supply, fill #4
  Filled 2024-04-25: qty 90, 30d supply, fill #5

## 2023-09-13 ENCOUNTER — Ambulatory Visit (INDEPENDENT_AMBULATORY_CARE_PROVIDER_SITE_OTHER): Payer: Self-pay | Admitting: Surgical

## 2023-09-13 DIAGNOSIS — Z411 Encounter for cosmetic surgery: Secondary | ICD-10-CM

## 2023-09-13 NOTE — Progress Notes (Signed)
Botulinum Toxin Procedure Note  Procedure: Cosmetic botulinum toxin  Pre-operative Diagnosis: Dynamic rhytides  Post-operative Diagnosis: Same  Complications:  None  Brief history: The patient desires botulinum toxin injection.  She is aware of the risks including bleeding, damage to deeper structures, asymmetry, brow ptosis, eyelid ptosis, bruising. The patient understands and wishes to proceed.  Procedure: The area was prepped with alcohol and dried with a clean gauze.  Using a clean technique the botulinum toxin was diluted with 2.5 mL of bacteriostatic saline per 100 unit vial which resulted in 4 units per 0.1 mL.  Subsequently the mixture was injected in the glabellar, lateral canthal lines, forehead area with preservation of the temporal branch to the lateral eyebrow. A total of 30 Units of botulinum toxin was used. The forehead and glabellar area was injected with care to inject intramuscular only while holding pressure on the supratrochlear vessels in each area during each injection on either side of the medial corrugators. The injection proceeded vertically superiorly to the medial 2/3 of the frontalis muscle and superior 2/3 of the lateral frontalis, again with preservation of the frontal branch.  No complications were noted. Light pressure was held for 5 minutes. She was instructed explicitly in post-operative care.  Botox LOT:  [id number] EXP:  06/2025

## 2023-09-21 ENCOUNTER — Other Ambulatory Visit (HOSPITAL_BASED_OUTPATIENT_CLINIC_OR_DEPARTMENT_OTHER): Payer: Self-pay

## 2023-09-21 ENCOUNTER — Other Ambulatory Visit: Payer: Self-pay

## 2023-09-21 MED ORDER — CLOBETASOL PROPIONATE 0.05 % EX OINT
1.0000 | TOPICAL_OINTMENT | Freq: Two times a day (BID) | CUTANEOUS | 1 refills | Status: AC | PRN
  Filled 2023-09-21: qty 45, 30d supply, fill #0

## 2023-09-21 MED ORDER — CLOBETASOL PROPIONATE 0.05 % EX SOLN
1.0000 | Freq: Two times a day (BID) | CUTANEOUS | 1 refills | Status: DC
  Filled 2023-09-21: qty 50, 25d supply, fill #0
  Filled 2024-01-17: qty 50, 25d supply, fill #1

## 2023-09-22 ENCOUNTER — Other Ambulatory Visit: Payer: Self-pay | Admitting: Obstetrics and Gynecology

## 2023-09-22 DIAGNOSIS — Z01419 Encounter for gynecological examination (general) (routine) without abnormal findings: Secondary | ICD-10-CM

## 2023-09-22 DIAGNOSIS — Z3041 Encounter for surveillance of contraceptive pills: Secondary | ICD-10-CM

## 2023-09-22 DIAGNOSIS — Z3009 Encounter for other general counseling and advice on contraception: Secondary | ICD-10-CM

## 2023-09-23 ENCOUNTER — Other Ambulatory Visit (HOSPITAL_BASED_OUTPATIENT_CLINIC_OR_DEPARTMENT_OTHER): Payer: Self-pay

## 2023-09-23 MED ORDER — NORETHIN ACE-ETH ESTRAD-FE 1.5-30 MG-MCG PO TABS
1.0000 | ORAL_TABLET | Freq: Every day | ORAL | 5 refills | Status: DC
  Filled 2023-09-23: qty 84, 84d supply, fill #0

## 2023-10-11 ENCOUNTER — Other Ambulatory Visit (HOSPITAL_BASED_OUTPATIENT_CLINIC_OR_DEPARTMENT_OTHER): Payer: Self-pay

## 2023-10-11 ENCOUNTER — Encounter: Payer: Self-pay | Admitting: Hematology & Oncology

## 2023-10-26 IMAGING — MG MM DIGITAL SCREENING BILAT W/ TOMO AND CAD
8 series · 8 of 24 positions shown · non-contrast
Comparison: Previous exam(s).

ACR Breast Density Category a: The breast tissue is almost entirely
fatty.

CLINICAL DATA: Screening.

EXAM:
DIGITAL SCREENING BILATERAL MAMMOGRAM WITH TOMOSYNTHESIS AND CAD
TECHNIQUE: Bilateral screening digital craniocaudal and mediolateral oblique
mammograms were obtained. Bilateral screening digital breast
tomosynthesis was performed. The images were evaluated with
computer-aided detection.

[L MLO synth-2D]
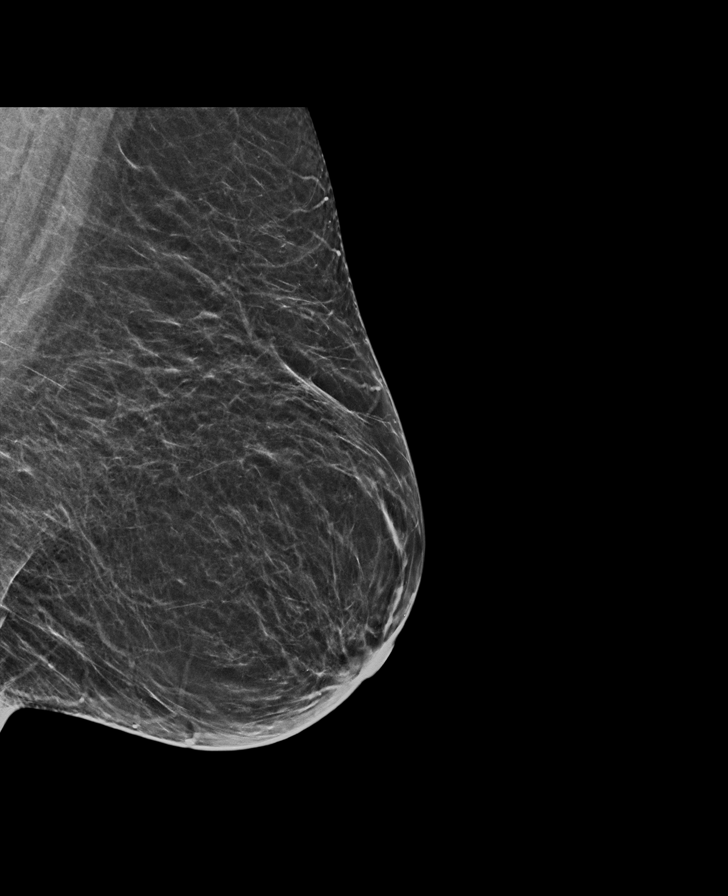

[L CC synth-2D]
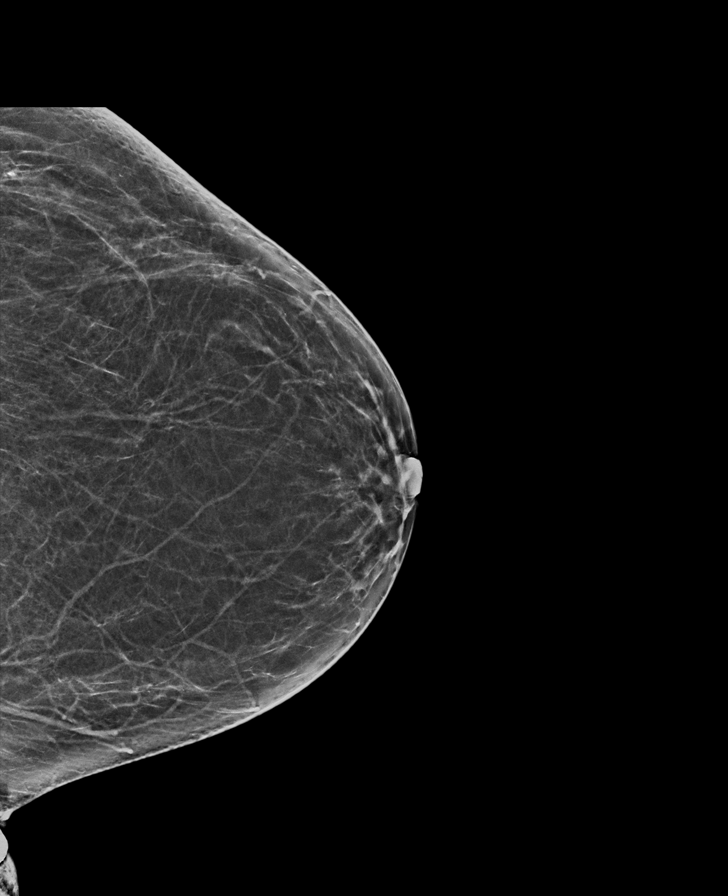

[R CC synth-2D]
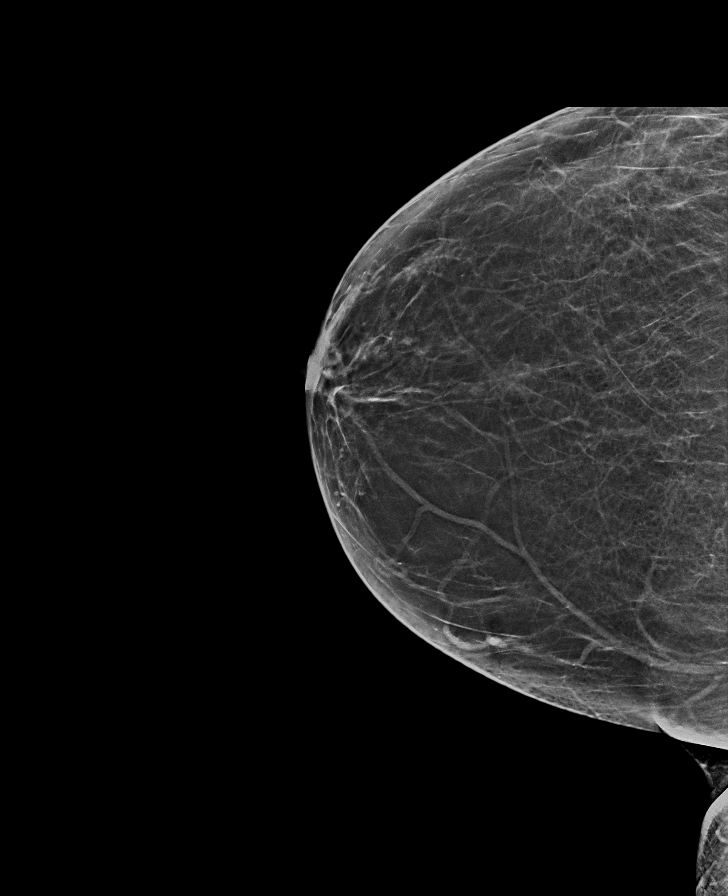

[R MLO synth-2D]
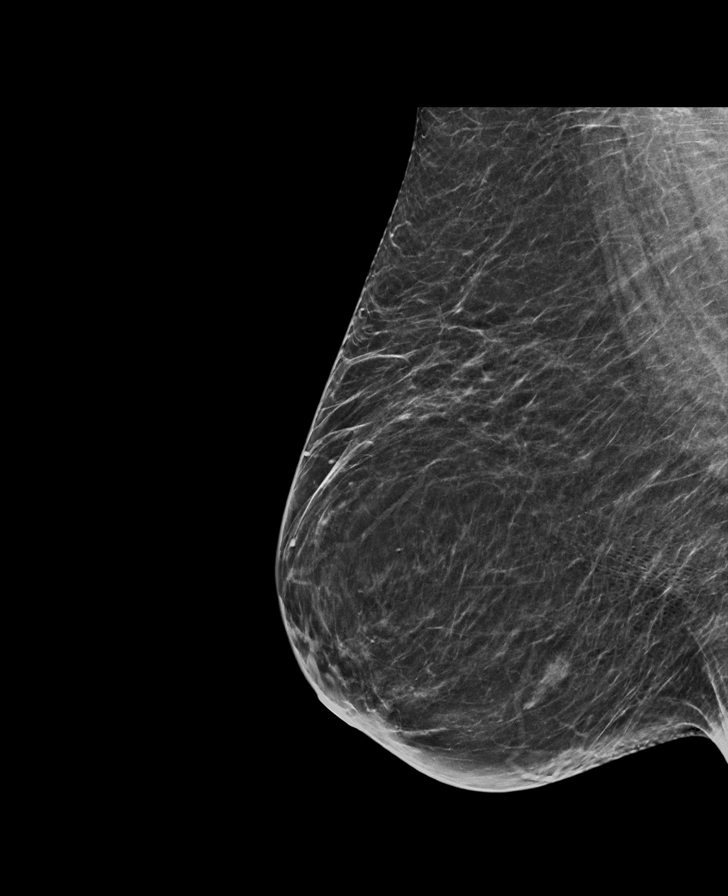

[R MLO tomo · tomo slice 33/65.0]
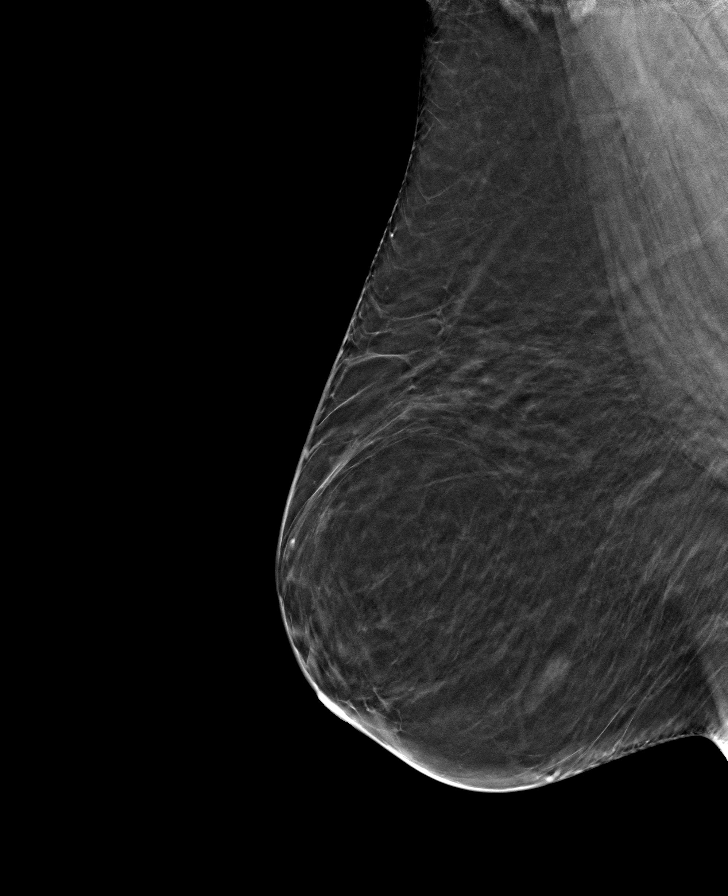

[R CC tomo · tomo slice 30/59.0]
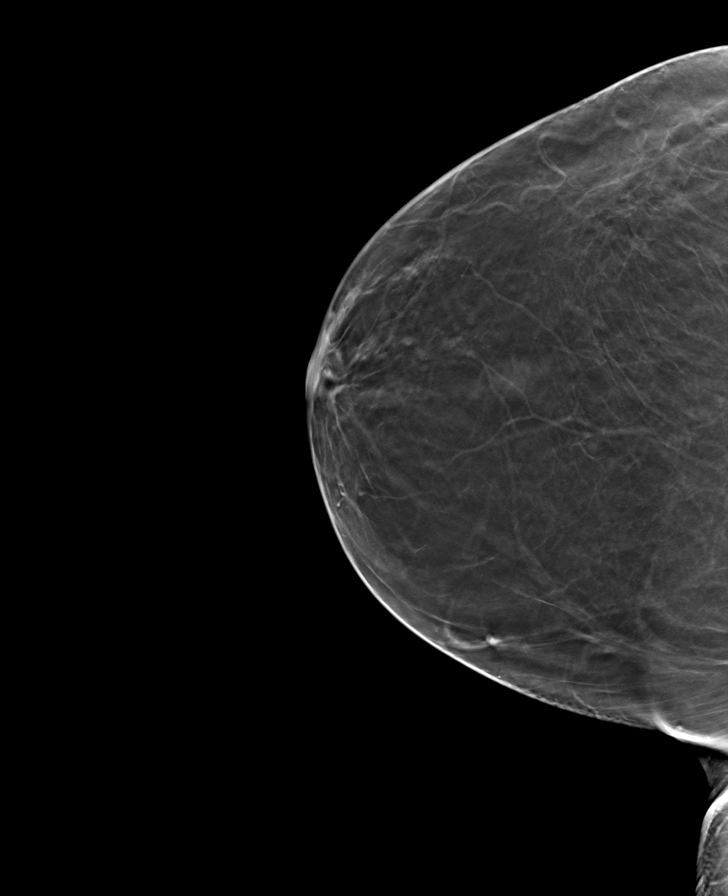

[L MLO tomo · tomo slice 31/62.0]
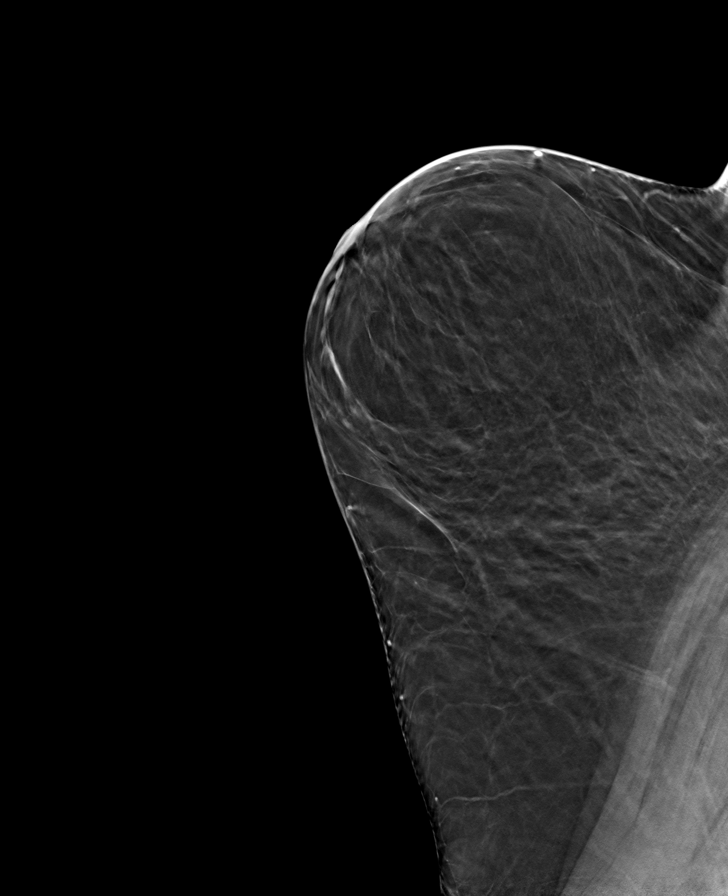

[L CC tomo · tomo slice 31/61.0]
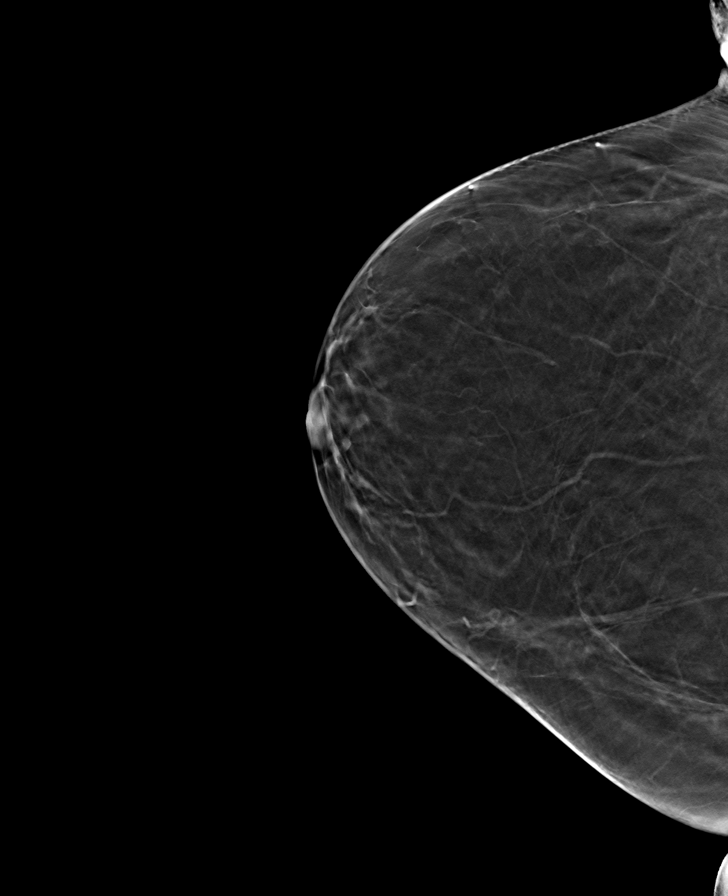

[8 of 24 positions shown; findings below may reference images not displayed]

FINDINGS: There are no findings suspicious for malignancy.
IMPRESSION: No mammographic evidence of malignancy. A result letter of this
screening mammogram will be mailed directly to the patient.

RECOMMENDATION:
Screening mammogram in one year. (Code:0E-3-N98)

BI-RADS CATEGORY  1: Negative.

## 2023-10-29 ENCOUNTER — Encounter: Payer: Self-pay | Admitting: Hematology & Oncology

## 2023-11-05 ENCOUNTER — Other Ambulatory Visit (HOSPITAL_BASED_OUTPATIENT_CLINIC_OR_DEPARTMENT_OTHER): Payer: Self-pay

## 2023-11-05 ENCOUNTER — Ambulatory Visit (INDEPENDENT_AMBULATORY_CARE_PROVIDER_SITE_OTHER): Payer: 59 | Admitting: Obstetrics and Gynecology

## 2023-11-05 DIAGNOSIS — Z3009 Encounter for other general counseling and advice on contraception: Secondary | ICD-10-CM

## 2023-11-05 DIAGNOSIS — Z01419 Encounter for gynecological examination (general) (routine) without abnormal findings: Secondary | ICD-10-CM

## 2023-11-05 DIAGNOSIS — Z3041 Encounter for surveillance of contraceptive pills: Secondary | ICD-10-CM

## 2023-11-05 MED ORDER — NORETHIN ACE-ETH ESTRAD-FE 1.5-30 MG-MCG PO TABS
1.0000 | ORAL_TABLET | Freq: Every day | ORAL | 6 refills | Status: AC
  Filled 2023-11-05: qty 84, 84d supply, fill #0
  Filled 2023-12-06: qty 84, 63d supply, fill #0
  Filled 2024-02-13 – 2024-02-14 (×2): qty 84, 84d supply, fill #1
  Filled 2024-02-14: qty 84, 63d supply, fill #1
  Filled 2024-04-14: qty 84, 63d supply, fill #2
  Filled 2024-06-12: qty 84, 63d supply, fill #3
  Filled 2024-08-14: qty 84, 63d supply, fill #4
  Filled 2024-10-16: qty 84, 63d supply, fill #5

## 2023-11-05 NOTE — Progress Notes (Signed)
ANNUAL EXAM Patient name: Emily [name] MRN [id number]  Date of birth: February 01, 1976 Chief Complaint:   Gynecologic Exam (Needs refill on OCP)  History of Present Illness:   Emily [name] is a 48 y.o. G0P0000 being seen today for a routine annual exam.  Current complaints: none  Menstrual concerns? No  continuous OCPs for amenorrhea Breast or nipple changes? No  Contraception use? Yes OCPs Sexually active? Yes   Has not been having any hot flashes or vaginal dryness. No other concerns with OCPs. Takes lasix prn for side effect of swelling of cardizem (used for SVT)  No LMP recorded.   The pregnancy intention screening data noted above was reviewed. Potential methods of contraception were discussed. The patient elected to proceed with No data recorded.   Last pap     Component Value Date/Time   DIAGPAP  06/23/2021 0847    - Negative for Intraepithelial Lesions or Malignancy (NILM)   DIAGPAP - Benign reactive/reparative changes 06/23/2021 0847   DIAGPAP  06/20/2020 0854    - Negative for intraepithelial lesion or malignancy (NILM)   HPVHIGH Negative 06/23/2021 0847   HPVHIGH Positive (A) 06/20/2020 0854   ADEQPAP  06/23/2021 0847    Satisfactory for evaluation; transformation zone component PRESENT.   ADEQPAP  06/20/2020 0854    Satisfactory for evaluation; transformation zone component PRESENT.   ADEQPAP (A) 06/15/2019 0000    Satisfactory for evaluation  endocervical/transformation zone component PRESENT.   Last mammogram: 09/2022 BIRADS 1.  Last colonoscopy: n/a.      11/05/2023    9:58 AM 06/22/2023    7:16 AM 12/03/2021    7:30 AM 11/20/2020    8:12 AM 08/13/2017    4:38 PM  Depression screen PHQ 2/9  Decreased Interest 0 0 0 0 0  Down, Depressed, Hopeless 0 0 0 1 0  PHQ - 2 Score 0 0 0 1 0  Altered sleeping 0 0  0 0  Tired, decreased energy 0 0  0 0  Change in appetite 0 0  0 0  Feeling bad or failure about yourself  0 0  0 0  Trouble concentrating 0 0  0 0   Moving slowly or fidgety/restless 0 0  0 0  Suicidal thoughts 0 0  0 0  PHQ-9 Score 0 0  1 0  Difficult doing work/chores  Not difficult at all  Somewhat difficult         11/05/2023    9:59 AM 11/20/2020    8:12 AM  GAD 7 : Generalized Anxiety Score  Nervous, Anxious, on Edge 0 0  Control/stop worrying 0 0  Worry too much - different things 0 0  Trouble relaxing 0 0  Restless 0 0  Easily annoyed or irritable 0 3  Afraid - awful might happen 0 0  Total GAD 7 Score 0 3  Anxiety Difficulty  Somewhat difficult     Review of Systems:   Pertinent items are noted in HPI Denies any headaches, blurred vision, fatigue, shortness of breath, chest pain, abdominal pain, abnormal vaginal discharge/itching/odor/irritation, problems with periods, bowel movements, urination, or intercourse unless otherwise stated above. Pertinent History Reviewed:  Reviewed past medical,surgical, social and family history.  Reviewed problem list, medications and allergies. Physical Assessment:   Vitals:   11/05/23 0953  BP: 111/63  Pulse: 72  Weight: 206 lb (93.4 kg)  Height: 5\' 4"  (1.626 m)  Body mass index is 35.36 kg/m.        Physical  Examination:   General appearance - well appearing, and in no distress  Mental status - alert, oriented to person, place, and time  Psych:  She has a normal mood and affect  Skin - warm and dry, normal color, no suspicious lesions noted  Chest - effort normal  Pelvic - deferred  Extremities:  No swelling or varicosities noted  Chaperone present for exam  No results found for this or any previous visit (from the past 24 hours).    Assessment & Plan:  1. Well woman exam with routine gynecological exam - Cervical cancer screening: Discussed guidelines. Pap with HPV up to date, due 2027 - STD Testing: not indicated - Birth Control: Discussed options and their risks, benefits and common side effects; discussed VTE with estrogen containing options. Desires:  OCPs - Breast Health: Encouraged self breast awareness/SBE. Teaching provided. Discussed limits of clinical breast exam for detecting breast cancer.  scheduled - F/U 12 months and prn  - norethindrone-ethinyl estradiol-iron (BLISOVI FE 1.5/30) 1.5-30 MG-MCG tablet; Take 1 tablet by mouth daily. Skip placebo and take continuously  Dispense: 84 tablet; Refill: 6  2. Encounter for surveillance of contraceptive pills Continue OCPs for now. No newly developed contraindications to [facility]. Discussed consider switching to lower dose contraceptive next year to balance out need for contraceptive with risks of continued hormone therapy beyond 50. OCP dosage likely controlling any potential VMS that may be present.   - norethindrone-ethinyl estradiol-iron (BLISOVI FE 1.5/30) 1.5-30 MG-MCG tablet; Take 1 tablet by mouth daily. Skip placebo and take continuously  Dispense: 84 tablet; Refill: 6   No orders of the defined types were placed in this encounter.   Meds: No orders of the defined types were placed in this encounter.   Follow-up: No follow-ups on file.  Lorriane Shire, MD 11/05/2023 9:59 AM

## 2023-11-10 ENCOUNTER — Inpatient Hospital Stay (HOSPITAL_BASED_OUTPATIENT_CLINIC_OR_DEPARTMENT_OTHER): Payer: 59 | Admitting: Family

## 2023-11-10 ENCOUNTER — Inpatient Hospital Stay: Payer: 59 | Attending: Family

## 2023-11-10 VITALS — BP 110/51 | HR 66 | Temp 98.1°F | Resp 16 | Ht 64.0 in | Wt 207.0 lb

## 2023-11-10 DIAGNOSIS — K912 Postsurgical malabsorption, not elsewhere classified: Secondary | ICD-10-CM | POA: Diagnosis not present

## 2023-11-10 DIAGNOSIS — D509 Iron deficiency anemia, unspecified: Secondary | ICD-10-CM | POA: Diagnosis not present

## 2023-11-10 DIAGNOSIS — Z9884 Bariatric surgery status: Secondary | ICD-10-CM | POA: Insufficient documentation

## 2023-11-10 DIAGNOSIS — E538 Deficiency of other specified B group vitamins: Secondary | ICD-10-CM

## 2023-11-10 LAB — CBC WITH DIFFERENTIAL (CANCER CENTER ONLY)
Abs Immature Granulocytes: 0.01 10*3/uL (ref 0.00–0.07)
Basophils Absolute: 0 10*3/uL (ref 0.0–0.1)
Basophils Relative: 1 %
Eosinophils Absolute: 0.1 10*3/uL (ref 0.0–0.5)
Eosinophils Relative: 2 %
HCT: 41.7 % (ref 36.0–46.0)
Hemoglobin: 13.8 g/dL (ref 12.0–15.0)
Immature Granulocytes: 0 %
Lymphocytes Relative: 33 %
Lymphs Abs: 1.9 10*3/uL (ref 0.7–4.0)
MCH: 29.6 pg (ref 26.0–34.0)
MCHC: 33.1 g/dL (ref 30.0–36.0)
MCV: 89.5 fL (ref 80.0–100.0)
Monocytes Absolute: 0.3 10*3/uL (ref 0.1–1.0)
Monocytes Relative: 5 %
Neutro Abs: 3.5 10*3/uL (ref 1.7–7.7)
Neutrophils Relative %: 59 %
Platelet Count: 344 10*3/uL (ref 150–400)
RBC: 4.66 MIL/uL (ref 3.87–5.11)
RDW: 12.6 % (ref 11.5–15.5)
WBC Count: 5.8 10*3/uL (ref 4.0–10.5)
nRBC: 0 % (ref 0.0–0.2)

## 2023-11-10 LAB — IRON AND IRON BINDING CAPACITY (CC-WL,HP ONLY)
Iron: 202 ug/dL — ABNORMAL HIGH (ref 28–170)
Saturation Ratios: 42 % — ABNORMAL HIGH (ref 10.4–31.8)
TIBC: 487 ug/dL — ABNORMAL HIGH (ref 250–450)
UIBC: 285 ug/dL (ref 148–442)

## 2023-11-10 LAB — FERRITIN: Ferritin: 11 ng/mL (ref 11–307)

## 2023-11-10 LAB — RETICULOCYTES
Immature Retic Fract: 6 % (ref 2.3–15.9)
RBC.: 4.63 MIL/uL (ref 3.87–5.11)
Retic Count, Absolute: 56.9 10*3/uL (ref 19.0–186.0)
Retic Ct Pct: 1.2 % (ref 0.4–3.1)

## 2023-11-10 LAB — VITAMIN B12: Vitamin B-12: 217 pg/mL (ref 180–914)

## 2023-11-10 NOTE — Progress Notes (Signed)
 Hematology and Oncology Follow Up Visit  Emily [name] [id number] 01-28-1976 48 y.o. 11/10/2023   Principle Diagnosis:  Iron  deficiency anemia secondary to malabsorption s/p gastric bypass in 2007.    Current Therapy:        IV iron  as indicated  B 12 injections weekly - prescribed by PCP   Interim History:  Emily [name] is here today for follow-up. She is doing quite well and has no complaints at this time.  No issues with blood loss. No bruising or petechiae.  No fever, chills, n/v, cough, rash, dizziness, SOB, chest pain, palpitations, abdominal pain or changes in bowel or bladder habits.  No swelling, tenderness, numbness or tingling in her extremities at this time.  No falls or syncope.  Appetite and hydration are good. Weight is stable at 207 lbs.   ECOG Performance Status: 0 - Asymptomatic  Medications:  Allergies as of 11/10/2023       Reactions   Almond Oil Swelling   Apple Juice Swelling   Kiwi Extract Swelling   Soy Allergy (do Not Select) Itching, Swelling   Mouth/ tongue itch   Strawberry Extract Itching   Mouth/ tongue itch        Medication List        Accurate as of November 10, 2023  9:52 AM. If you have any questions, ask your nurse or doctor.          buPROPion  150 MG 24 hr tablet Commonly known as: WELLBUTRIN  XL Take 1 tablet (150 mg total) by mouth daily.   cetirizine 10 MG tablet Commonly known as: ZYRTEC Take 10 mg by mouth daily.   clobetasol  0.05 % external solution Commonly known as: TEMOVATE  Apply 1 application topically 2 (two) times daily.   clobetasol  ointment 0.05 % Commonly known as: TEMOVATE  Apply 1 Application topically 2 (two) times daily as needed.   cyanocobalamin  1000 MCG/ML injection Commonly known as: VITAMIN B12 Inject 1 mL (1,000 mcg total) into the muscle once a week.   diltiazem  120 MG 24 hr capsule Commonly known as: Cardizem  CD Take 1 capsule (120 mg total) by mouth daily.   docusate sodium 100 MG  capsule Commonly known as: COLACE Take 100 mg by mouth every other day.   EPINEPHrine  0.3 mg/0.3 mL Soaj injection Commonly known as: EpiPen  2-Pak Inject 0.3 mg into the muscle as needed for anaphylaxis.   folic acid  1 MG tablet Commonly known as: FOLVITE  Take 1 mg by mouth daily.   furosemide  20 MG tablet Commonly known as: LASIX  Take 1 tablet (20 mg total) by mouth daily.   gabapentin  100 MG capsule Commonly known as: NEURONTIN  Take 1 capsule (100 mg total) by mouth 3 (three) times daily.   Iron  325 (65 Fe) MG Tabs Take 1 tablet (325 mg total) by mouth every other day.   ketoconazole 2 % shampoo Commonly known as: NIZORAL Apply 1 application. topically daily.   norethindrone -ethinyl estradiol -iron  1.5-30 MG-MCG tablet Commonly known as: Blisovi  Fe 1.5/30 Take 1 tablet by mouth daily. Skip placebo and take continuously   SUPER B COMPLEX PO Take 1 tablet by mouth daily.   valACYclovir  1000 MG tablet Commonly known as: VALTREX  Take 2 tablets (2,000 mg total) by mouth now and 2 tablets (2,000 mg total) by mouth in 12 (twelve) hours for cold sore.        Allergies:  Allergies  Allergen Reactions   Almond Oil Swelling   Apple Juice Swelling   Kiwi Extract Swelling  Soy Allergy (Do Not Select) Itching and Swelling    Mouth/ tongue itch   Strawberry Extract Itching    Mouth/ tongue itch    Past Medical History, Surgical history, Social history, and Family History were reviewed and updated.  Review of Systems: All other 10 point review of systems is negative.   Physical Exam:  height is 5' 4 (1.626 m) and weight is 207 lb (93.9 kg). Her oral temperature is 98.1 F (36.7 C). Her blood pressure is 110/51 (abnormal) and her pulse is 66. Her respiration is 16 and oxygen saturation is 100%.   Wt Readings from Last 3 Encounters:  11/10/23 207 lb (93.9 kg)  11/05/23 206 lb (93.4 kg)  06/22/23 198 lb (89.8 kg)    Ocular: Sclerae unicteric, pupils equal, round  and reactive to light Ear-nose-throat: Oropharynx clear, dentition fair Lymphatic: No cervical or supraclavicular adenopathy Lungs no rales or rhonchi, good excursion bilaterally Heart regular rate and rhythm, no murmur appreciated Abd soft, nontender, positive bowel sounds MSK no focal spinal tenderness, no joint edema Neuro: non-focal, well-oriented, appropriate affect Breasts:   Lab Results  Component Value Date   WBC 5.8 11/10/2023   HGB 13.8 11/10/2023   HCT 41.7 11/10/2023   MCV 89.5 11/10/2023   PLT 344 11/10/2023   Lab Results  Component Value Date   FERRITIN 24 05/10/2023   IRON  190 (H) 05/10/2023   TIBC 469 (H) 05/10/2023   UIBC 279 05/10/2023   IRONPCTSAT 41 (H) 05/10/2023   Lab Results  Component Value Date   RETICCTPCT 1.2 11/10/2023   RBC 4.63 11/10/2023   RBC 4.66 11/10/2023   No results found for: KPAFRELGTCHN, LAMBDASER, KAPLAMBRATIO No results found for: IGGSERUM, IGA, IGMSERUM No results found for: STEPHANY CARLOTA BENSON MARKEL EARLA JOANNIE DOC VICK, [name]   Chemistry      Component Value Date/Time   NA 140 06/22/2023 0813   NA 142 02/07/2018 0812   K 4.5 06/22/2023 0813   CL 104 06/22/2023 0813   CO2 27 06/22/2023 0813   BUN 15 06/22/2023 0813   BUN 15 02/07/2018 0812   CREATININE 0.69 06/22/2023 0813   CREATININE 0.84 10/28/2022 1016      Component Value Date/Time   CALCIUM 8.7 06/22/2023 0813   ALKPHOS 72 06/22/2023 0813   AST 18 06/22/2023 0813   AST 16 10/28/2022 1016   ALT 20 06/22/2023 0813   ALT 15 10/28/2022 1016   BILITOT 1.1 06/22/2023 0813   BILITOT 1.1 10/28/2022 1016       Impression and Plan: Emily [name] is a very pleasant 48 yo caucasian female with iron  deficiency anemia secondary to malabsorption s/p gastric bypass in 2007.  Iron  studies are pending. We will replace if needed.  Follow-up in 6 months.   Lauraine Pepper, NP 2/5/20259:52 AM

## 2023-12-03 ENCOUNTER — Ambulatory Visit (INDEPENDENT_AMBULATORY_CARE_PROVIDER_SITE_OTHER): Payer: Self-pay | Admitting: Surgical

## 2023-12-03 DIAGNOSIS — Z411 Encounter for cosmetic surgery: Secondary | ICD-10-CM

## 2023-12-03 NOTE — Progress Notes (Signed)
 Botulinum Toxin Procedure Note  Procedure: Cosmetic botulinum toxin  Pre-operative Diagnosis: Dynamic rhytides  Post-operative Diagnosis: Same  Complications:  None  Brief history: The patient desires botulinum toxin injection.  She is aware of the risks including bleeding, damage to deeper structures, asymmetry, brow ptosis, eyelid ptosis, bruising. The patient understands and wishes to proceed.  Procedure: The area was prepped with alcohol and dried with a clean gauze.  Using a clean technique the botulinum toxin was diluted with 2.5 mL of bacteriostatic saline per 100 unit vial which resulted in 4 units per 0.1 mL.  Subsequently the mixture was injected in the glabellar, lateral canthal lines, forehead area with preservation of the temporal branch to the lateral eyebrow. A total of 30 Units of botulinum toxin was used. The forehead and glabellar area was injected with care to inject intramuscular only while holding pressure on the supratrochlear vessels in each area during each injection on either side of the medial corrugators. The injection proceeded vertically superiorly to the medial 2/3 of the frontalis muscle and superior 2/3 of the lateral frontalis, again with preservation of the frontal branch.  No complications were noted. Light pressure was held for 5 minutes. She was instructed explicitly in post-operative care.  Botox LOT: [id number] C3 EXP: September 2026

## 2023-12-06 ENCOUNTER — Other Ambulatory Visit (HOSPITAL_BASED_OUTPATIENT_CLINIC_OR_DEPARTMENT_OTHER): Payer: Self-pay

## 2023-12-06 ENCOUNTER — Other Ambulatory Visit: Payer: Self-pay

## 2023-12-06 MED ORDER — FUROSEMIDE 20 MG PO TABS
20.0000 mg | ORAL_TABLET | Freq: Every day | ORAL | 1 refills | Status: DC
  Filled 2023-12-06: qty 90, 90d supply, fill #0
  Filled 2024-03-15: qty 90, 90d supply, fill #1

## 2023-12-20 ENCOUNTER — Other Ambulatory Visit: Payer: Self-pay | Admitting: Family

## 2023-12-20 ENCOUNTER — Other Ambulatory Visit (HOSPITAL_BASED_OUTPATIENT_CLINIC_OR_DEPARTMENT_OTHER): Payer: Self-pay

## 2023-12-20 MED ORDER — BUPROPION HCL ER (XL) 150 MG PO TB24
150.0000 mg | ORAL_TABLET | Freq: Every day | ORAL | 1 refills | Status: DC
Start: 2023-12-20 — End: 2024-06-13
  Filled 2023-12-20: qty 90, 90d supply, fill #0
  Filled 2024-03-15: qty 90, 90d supply, fill #1

## 2024-02-14 ENCOUNTER — Ambulatory Visit (HOSPITAL_BASED_OUTPATIENT_CLINIC_OR_DEPARTMENT_OTHER)
Admission: RE | Admit: 2024-02-14 | Discharge: 2024-02-14 | Disposition: A | Discharge status: Home / Self Care | Source: Ambulatory Visit | Attending: Family | Admitting: Family

## 2024-02-14 ENCOUNTER — Encounter: Payer: Self-pay | Admitting: Hematology & Oncology

## 2024-02-14 ENCOUNTER — Encounter (HOSPITAL_BASED_OUTPATIENT_CLINIC_OR_DEPARTMENT_OTHER): Payer: Self-pay

## 2024-02-14 ENCOUNTER — Other Ambulatory Visit (HOSPITAL_BASED_OUTPATIENT_CLINIC_OR_DEPARTMENT_OTHER): Payer: Self-pay

## 2024-02-14 DIAGNOSIS — Z1231 Encounter for screening mammogram for malignant neoplasm of breast: Secondary | ICD-10-CM | POA: Diagnosis not present

## 2024-02-29 ENCOUNTER — Other Ambulatory Visit: Payer: Self-pay | Admitting: Cardiology

## 2024-02-29 ENCOUNTER — Other Ambulatory Visit (HOSPITAL_BASED_OUTPATIENT_CLINIC_OR_DEPARTMENT_OTHER): Payer: Self-pay

## 2024-02-29 MED ORDER — DILTIAZEM HCL ER COATED BEADS 120 MG PO CP24
120.0000 mg | ORAL_CAPSULE | Freq: Every day | ORAL | 1 refills | Status: DC
  Filled 2024-02-29: qty 30, 30d supply, fill #0
  Filled 2024-03-30: qty 30, 30d supply, fill #1

## 2024-03-03 ENCOUNTER — Other Ambulatory Visit: Payer: Self-pay

## 2024-03-17 ENCOUNTER — Other Ambulatory Visit (HOSPITAL_BASED_OUTPATIENT_CLINIC_OR_DEPARTMENT_OTHER): Payer: Self-pay

## 2024-04-28 ENCOUNTER — Other Ambulatory Visit: Payer: Self-pay | Admitting: Cardiology

## 2024-04-28 ENCOUNTER — Other Ambulatory Visit: Payer: Self-pay

## 2024-04-28 ENCOUNTER — Other Ambulatory Visit (HOSPITAL_BASED_OUTPATIENT_CLINIC_OR_DEPARTMENT_OTHER): Payer: Self-pay

## 2024-04-28 MED ORDER — DILTIAZEM HCL ER COATED BEADS 120 MG PO CP24
120.0000 mg | ORAL_CAPSULE | Freq: Every day | ORAL | 0 refills | Status: DC
  Filled 2024-04-28: qty 15, 15d supply, fill #0

## 2024-05-09 ENCOUNTER — Inpatient Hospital Stay (HOSPITAL_BASED_OUTPATIENT_CLINIC_OR_DEPARTMENT_OTHER): Payer: 59 | Admitting: Medical Oncology

## 2024-05-09 ENCOUNTER — Encounter: Payer: Self-pay | Admitting: Medical Oncology

## 2024-05-09 ENCOUNTER — Inpatient Hospital Stay: Payer: 59 | Attending: Family

## 2024-05-09 VITALS — BP 124/54 | HR 68 | Temp 98.5°F | Resp 18 | Ht 64.0 in | Wt 220.0 lb

## 2024-05-09 DIAGNOSIS — D509 Iron deficiency anemia, unspecified: Secondary | ICD-10-CM | POA: Insufficient documentation

## 2024-05-09 DIAGNOSIS — Z9884 Bariatric surgery status: Secondary | ICD-10-CM | POA: Diagnosis not present

## 2024-05-09 DIAGNOSIS — E538 Deficiency of other specified B group vitamins: Secondary | ICD-10-CM

## 2024-05-09 DIAGNOSIS — K912 Postsurgical malabsorption, not elsewhere classified: Secondary | ICD-10-CM | POA: Diagnosis not present

## 2024-05-09 LAB — CBC WITH DIFFERENTIAL (CANCER CENTER ONLY)
Abs Immature Granulocytes: 0.02 K/uL (ref 0.00–0.07)
Basophils Absolute: 0 K/uL (ref 0.0–0.1)
Basophils Relative: 1 %
Eosinophils Absolute: 0.2 K/uL (ref 0.0–0.5)
Eosinophils Relative: 3 %
HCT: 39.6 % (ref 36.0–46.0)
Hemoglobin: 12.7 g/dL (ref 12.0–15.0)
Immature Granulocytes: 0 %
Lymphocytes Relative: 32 %
Lymphs Abs: 1.9 K/uL (ref 0.7–4.0)
MCH: 27.8 pg (ref 26.0–34.0)
MCHC: 32.1 g/dL (ref 30.0–36.0)
MCV: 86.7 fL (ref 80.0–100.0)
Monocytes Absolute: 0.3 K/uL (ref 0.1–1.0)
Monocytes Relative: 6 %
Neutro Abs: 3.5 K/uL (ref 1.7–7.7)
Neutrophils Relative %: 58 %
Platelet Count: 296 K/uL (ref 150–400)
RBC: 4.57 MIL/uL (ref 3.87–5.11)
RDW: 13 % (ref 11.5–15.5)
WBC Count: 5.9 K/uL (ref 4.0–10.5)
nRBC: 0 % (ref 0.0–0.2)

## 2024-05-09 LAB — RETICULOCYTES
Immature Retic Fract: 7.1 % (ref 2.3–15.9)
RBC.: 4.65 MIL/uL (ref 3.87–5.11)
Retic Count, Absolute: 45.6 K/uL (ref 19.0–186.0)
Retic Ct Pct: 1 % (ref 0.4–3.1)

## 2024-05-09 LAB — IRON AND IRON BINDING CAPACITY (CC-WL,HP ONLY)
Iron: 104 ug/dL (ref 28–170)
Saturation Ratios: 19 % (ref 10.4–31.8)
TIBC: 536 ug/dL — ABNORMAL HIGH (ref 250–450)
UIBC: 432 ug/dL

## 2024-05-09 LAB — FERRITIN: Ferritin: 12 ng/mL (ref 11–307)

## 2024-05-09 NOTE — Progress Notes (Addendum)
 Hematology and Oncology Follow Up Visit  Emily [name] [id number] 1976/04/14 48 y.o. 05/09/2024   Principle Diagnosis:  Iron  deficiency anemia secondary to malabsorption s/p gastric bypass in 2007.    Current Therapy:        IV iron  as indicated  B 12 injections weekly - prescribed by PCP   Interim History:  Emily [name] is here today for follow-up.  She states that she is doing well. She has no concerns and has had no health changes since her last visit per patient.  No issues with blood loss. No bruising or petechiae.  No fever, chills, n/v, cough, rash, dizziness, SOB, chest pain, palpitations, abdominal pain or changes in bowel or bladder habits.  No swelling, tenderness, numbness or tingling in her extremities at this time.  No falls or syncope.  Appetite and hydration are good. Wt Readings from Last 3 Encounters:  05/09/24 220 lb (99.8 kg)  11/10/23 207 lb (93.9 kg)  11/05/23 206 lb (93.4 kg)    ECOG Performance Status: 0 - Asymptomatic  Medications:  Allergies as of 05/09/2024       Reactions   Almond Oil Swelling   Apple Juice Swelling   Kiwi Extract Swelling   Soy Allergy (obsolete) Itching, Swelling   Mouth/ tongue itch   Strawberry Extract Itching   Mouth/ tongue itch        Medication List        Accurate as of May 09, 2024 10:15 AM. If you have any questions, ask your nurse or doctor.          Blisovi  Fe 1.5/30 1.5-30 MG-MCG tablet Generic drug: norethindrone -ethinyl estradiol -iron  Take 1 tablet by mouth daily. Skip placebo and take continuously   buPROPion  150 MG 24 hr tablet Commonly known as: WELLBUTRIN  XL Take 1 tablet (150 mg total) by mouth daily.   cetirizine 10 MG tablet Commonly known as: ZYRTEC Take 10 mg by mouth daily.   clobetasol  0.05 % external solution Commonly known as: TEMOVATE  Apply 1 application topically 2 (two) times daily.   clobetasol  ointment 0.05 % Commonly known as: TEMOVATE  Apply 1 Application topically 2  (two) times daily as needed.   cyanocobalamin  1000 MCG/ML injection Commonly known as: VITAMIN B12 Inject 1 mL (1,000 mcg total) into the muscle once a week.   diltiazem  120 MG 24 hr capsule Commonly known as: Cardizem  CD Take 1 capsule (120 mg total) by mouth daily. *First attempt, patient needs an appt. for additional refills.*   docusate sodium 100 MG capsule Commonly known as: COLACE Take 100 mg by mouth every other day.   EPINEPHrine  0.3 mg/0.3 mL Soaj injection Commonly known as: EpiPen  2-Pak Inject 0.3 mg into the muscle as needed for anaphylaxis.   folic acid  1 MG tablet Commonly known as: FOLVITE  Take 1 mg by mouth daily.   furosemide  20 MG tablet Commonly known as: LASIX  Take 1 tablet (20 mg total) by mouth daily.   gabapentin  100 MG capsule Commonly known as: NEURONTIN  Take 1 capsule (100 mg total) by mouth 3 (three) times daily.   Iron  325 (65 Fe) MG Tabs Take 1 tablet (325 mg total) by mouth every other day.   ketoconazole 2 % shampoo Commonly known as: NIZORAL Apply 1 application. topically daily.   SUPER B COMPLEX PO Take 1 tablet by mouth daily.   valACYclovir  1000 MG tablet Commonly known as: VALTREX  Take 2 tablets (2,000 mg total) by mouth now and 2 tablets (2,000 mg total) by mouth in  12 (twelve) hours for cold sore.        Allergies:  Allergies  Allergen Reactions   Almond Oil Swelling   Apple Juice Swelling   Kiwi Extract Swelling   Soy Allergy (Obsolete) Itching and Swelling    Mouth/ tongue itch   Strawberry Extract Itching    Mouth/ tongue itch    Past Medical History, Surgical history, Social history, and Family History were reviewed and updated.  Review of Systems: All other 10 point review of systems is negative.   Physical Exam:  height is 5' 4 (1.626 m) and weight is 220 lb (99.8 kg). Her oral temperature is 98.5 F (36.9 C). Her blood pressure is 124/54 (abnormal) and her pulse is 68. Her respiration is 18 and oxygen  saturation is 100%.   Wt Readings from Last 3 Encounters:  05/09/24 220 lb (99.8 kg)  11/10/23 207 lb (93.9 kg)  11/05/23 206 lb (93.4 kg)    Ocular: Sclerae unicteric, pupils equal, round and reactive to light Ear-nose-throat: Oropharynx clear, dentition fair Lymphatic: No cervical or supraclavicular adenopathy Lungs no rales or rhonchi, good excursion bilaterally Heart regular rate and rhythm, no murmur appreciated Abd soft, nontender, positive bowel sounds MSK no focal spinal tenderness, no joint edema Neuro: non-focal, well-oriented, appropriate affect  Lab Results  Component Value Date   WBC 5.9 05/09/2024   HGB 12.7 05/09/2024   HCT 39.6 05/09/2024   MCV 86.7 05/09/2024   PLT 296 05/09/2024   Lab Results  Component Value Date   FERRITIN 11 11/10/2023   IRON  202 (H) 11/10/2023   TIBC 487 (H) 11/10/2023   UIBC 285 11/10/2023   IRONPCTSAT 42 (H) 11/10/2023   Lab Results  Component Value Date   RETICCTPCT 1.0 05/09/2024   RBC 4.57 05/09/2024   RBC 4.65 05/09/2024   No results found for: KPAFRELGTCHN, LAMBDASER, KAPLAMBRATIO No results found for: IGGSERUM, IGA, IGMSERUM No results found for: STEPHANY CARLOTA BENSON MARKEL EARLA JOANNIE DOC VICK, SPEI   Chemistry      Component Value Date/Time   NA 140 06/22/2023 0813   NA 142 02/07/2018 0812   K 4.5 06/22/2023 0813   CL 104 06/22/2023 0813   CO2 27 06/22/2023 0813   BUN 15 06/22/2023 0813   BUN 15 02/07/2018 0812   CREATININE 0.69 06/22/2023 0813   CREATININE 0.84 10/28/2022 1016      Component Value Date/Time   CALCIUM 8.7 06/22/2023 0813   ALKPHOS 72 06/22/2023 0813   AST 18 06/22/2023 0813   AST 16 10/28/2022 1016   ALT 20 06/22/2023 0813   ALT 15 10/28/2022 1016   BILITOT 1.1 06/22/2023 0813   BILITOT 1.1 10/28/2022 1016     Encounter Diagnoses  Name Primary?   Iron  deficiency anemia, unspecified iron  deficiency anemia type Yes   B12 deficiency      Impression and Plan: Emily [name] is a very pleasant 48 yo caucasian female with iron  deficiency anemia secondary to malabsorption s/p gastric bypass in 2007. She also has B12 deficiency. B12 managed by PCP.   Hgb looks good today with a value of 12.7 Retic reassuring  Iron  studies are pending. We will replace if needed.   RTC 12 months APP, labs(CBC, retic, iron , ferritin) or sooner as needed   Lauraine CHRISTELLA Dais, PA-C 8/5/202510:15 AM

## 2024-05-10 ENCOUNTER — Other Ambulatory Visit: Payer: Self-pay | Admitting: Medical Oncology

## 2024-05-10 ENCOUNTER — Ambulatory Visit: Payer: Self-pay | Admitting: Medical Oncology

## 2024-05-11 ENCOUNTER — Encounter: Payer: Self-pay | Admitting: Cardiology

## 2024-05-11 ENCOUNTER — Ambulatory Visit: Attending: Cardiology | Admitting: Cardiology

## 2024-05-11 VITALS — BP 110/70 | HR 75 | Ht 64.0 in | Wt 220.0 lb

## 2024-05-11 DIAGNOSIS — D509 Iron deficiency anemia, unspecified: Secondary | ICD-10-CM

## 2024-05-11 DIAGNOSIS — I471 Supraventricular tachycardia, unspecified: Secondary | ICD-10-CM

## 2024-05-11 DIAGNOSIS — Z9884 Bariatric surgery status: Secondary | ICD-10-CM | POA: Diagnosis not present

## 2024-05-11 NOTE — Progress Notes (Unsigned)
 Cardiology Office Note:    Date:  05/11/2024   ID:  Emily [name], DOB 1976/07/14, MRN [id number]  PCP:  Daryl Setter, NP  Cardiologist:  Lamar Fitch, MD    Referring MD: Daryl Setter, NP   Chief Complaint  Patient presents with   Annual Exam      History of Present Illness:    Emily [name] is a 48 y.o. female past medical history significant for supraventricular tachycardia but only very short episode she described about maybe twice a month she will get few seconds episode of palpitations that is being successfully managed with calcium channel blocker.  She does have history of gastric bypass surgery.  Recently she was found to be anemic.  She supposed to get iron  infusions otherwise doing great goes to gym on regular basis she does kickboxing.  No chest pain tightness squeezing pressure burning chest  Past Medical History:  Diagnosis Date   B12 deficiency    Benign paroxysmal positional vertigo of right ear 07/15/2021   Depression    Heart murmur    History of COVID-19 2020   per pt mild symptoms that resolved   Hypermetropia of both eyes 09/12/2014   IDA (iron  deficiency anemia)    Impingement syndrome of right shoulder 05/21/2015   Impingement syndrome of right shoulder 05/21/2015   Lactose intolerance    per pt due to gastric bypass   Morbid obesity (HCC) 08/15/2017   Nevus of choroid of left eye 09/12/2014   being monitored   Palpitations    due ot SVT   Psoriasis    Seborrheic dermatitis 02/08/2015   Status post gastric bypass for obesity 12/08/2016   SVT (supraventricular tachycardia) [facility])    cardiologist--- dr margean;    normal echo in epic 02-17-2021 and event monitor 02-04-2021  symptomatic SVT w/ 5 brief episodes the longest 14 beats   Vitamin D  deficiency    Wears glasses     Past Surgical History:  Procedure Laterality Date   COLPOSCOPY  2008   ENDOMETRIAL BIOPSY  2017   negative per pt   FOOT SURGERY Left 12/17/2021    LAPAROSCOPIC CHOLECYSTECTOMY  2006   MASS EXCISION Left 08/17/2017   Procedure: EXCISION SOFT TISSUE  MASS LEFT FLANK;  Surgeon: Eletha Boas, MD;  Location: [facility];  Service: General;  Laterality: Left;   ROUX-EN-Y GASTRIC BYPASS  2007   TENDON REPAIR Left 12/17/2021   Procedure: TENDON REPAIR OF LEFT FOOT;  Surgeon: Gershon Donnice SAUNDERS, DPM;  Location: [facility] [place];  Service: Podiatry;  Laterality: Left;  GENERAL WITH BLOCK    Current Medications: Current Meds  Medication Sig   B Complex-C (SUPER B COMPLEX PO) Take 1 tablet by mouth daily.   buPROPion  (WELLBUTRIN  XL) 150 MG 24 hr tablet Take 1 tablet (150 mg total) by mouth daily.   cetirizine (ZYRTEC) 10 MG tablet Take 10 mg by mouth daily.   clobetasol  (TEMOVATE ) 0.05 % external solution Apply 1 application topically 2 (two) times daily.   clobetasol  ointment (TEMOVATE ) 0.05 % Apply 1 Application topically 2 (two) times daily as needed.   cyanocobalamin  (VITAMIN B12) 1000 MCG/ML injection Inject 1 mL (1,000 mcg total) into the muscle once a week.   diltiazem  (CARDIZEM  CD) 120 MG 24 hr capsule Take 1 capsule (120 mg total) by mouth daily. *First attempt, patient needs an appt. for additional refills.*   docusate sodium (COLACE) 100 MG capsule Take 100 mg by mouth every other day.  EPINEPHrine  (EPIPEN  2-PAK) 0.3 mg/0.3 mL IJ SOAJ injection Inject 0.3 mg into the muscle as needed for anaphylaxis.   Ferrous Sulfate (IRON ) 325 (65 Fe) MG TABS Take 1 tablet (325 mg total) by mouth every other day.   folic acid  (FOLVITE ) 1 MG tablet Take 1 mg by mouth daily.   furosemide  (LASIX ) 20 MG tablet Take 1 tablet (20 mg total) by mouth daily.   gabapentin  (NEURONTIN ) 100 MG capsule Take 1 capsule (100 mg total) by mouth 3 (three) times daily.   ketoconazole (NIZORAL) 2 % shampoo Apply 1 application. topically daily.   norethindrone -ethinyl estradiol -iron  (BLISOVI  FE 1.5/30) 1.5-30 MG-MCG tablet Take 1 tablet by mouth  daily. Skip placebo and take continuously   valACYclovir  (VALTREX ) 1000 MG tablet Take 2 tablets (2,000 mg total) by mouth now and 2 tablets (2,000 mg total) by mouth in 12 (twelve) hours for cold sore.     Allergies:   Almond oil, Apple juice, Kiwi extract, Soy allergy (obsolete), and Strawberry extract   Social History   Socioeconomic History   Marital status: Single    Spouse name: Not on file   Number of children: Not on file   Years of education: Not on file   Highest education level: Master's degree (e.g., MA, MS, MEng, MEd, MSW, MBA)  Occupational History   Occupation: Teacher, adult education: [employer]  Tobacco Use   Smoking status: Never    Passive exposure: Never   Smokeless tobacco: Never  Vaping Use   Vaping status: Never Used  Substance and Sexual Activity   Alcohol use: Not Currently    Alcohol/week: 5.0 standard drinks of alcohol    Types: 5 Cans of beer per week   Drug use: Never   Sexual activity: Yes    Birth control/protection: Pill  Other Topics Concern   Not on file  Social History Narrative   Works as an Charity fundraiser for [employer]   No children   Not married   Has a dog named Doctor, general practice   From [place]- moved to [place] in [place]   Parents are both living   Has one sister back home   Enjoys going out to eat with friends   Enjoys biking and kayaking.    Goes to the GYM 6 days a week   Social Drivers of Corporate investment banker Strain: Low Risk  (05/17/2023)   Overall Financial Resource Strain (CARDIA)    Difficulty of Paying Living Expenses: Not hard at all  Food Insecurity: No Food Insecurity (05/17/2023)   Hunger Vital Sign    Worried About Running Out of Food in the Last Year: Never true    Ran Out of Food in the Last Year: Never true  Transportation Needs: No Transportation Needs (05/17/2023)   PRAPARE - Administrator, Civil Service (Medical): No    Lack of Transportation (Non-Medical): No  Physical Activity: Sufficiently Active (05/17/2023)    Exercise Vital Sign    Days of Exercise per Week: 4 days    Minutes of Exercise per Session: 60 min  Stress: No Stress Concern Present (05/17/2023)   [employer] of Occupational Health - Occupational Stress Questionnaire    Feeling of Stress : Only a little  Social Connections: Socially Isolated (05/17/2023)   Social Connection and Isolation Panel    Frequency of Communication with Friends and Family: More than three times a week    Frequency of Social Gatherings with Friends and Family:  More than three times a week    Attends Religious Services: Never    Active Member of Clubs or Organizations: No    Attends Engineer, structural: Not on file    Marital Status: Never married     Family History: The patient's family history includes ADD / ADHD in her sister; Alzheimer's disease in her maternal grandmother and paternal grandmother; Cancer in her paternal grandfather; Cancer - Other in her paternal grandfather; Colon polyps in her father; Diabetes type II in her maternal aunt; Heart attack in her paternal aunt; Hypertension in her father and mother; Leukemia in her maternal grandfather; Multiple sclerosis in her maternal aunt; Obesity in her father and mother; Sleep apnea in her father; Stroke in her maternal aunt. There is no history of Colon cancer, Rectal cancer, Stomach cancer, or Esophageal cancer. ROS:   Please see the history of present illness.    All 14 point review of systems negative except as described per history of present illness  EKGs/Labs/Other Studies Reviewed:         Recent Labs: 06/22/2023: ALT 20; BUN 15; Creatinine, Ser 0.69; Potassium 4.5; Sodium 140 05/09/2024: Hemoglobin 12.7; Platelet Count 296  Recent Lipid Panel    Component Value Date/Time   CHOL 191 06/22/2023 0813   CHOL 200 (H) 02/07/2018 0812   TRIG 83.0 06/22/2023 0813   HDL 104.60 06/22/2023 0813   HDL 113 02/07/2018 0812   CHOLHDL 2 06/22/2023 0813   VLDL 16.6 06/22/2023 0813    LDLCALC 70 06/22/2023 0813   LDLCALC 74 02/07/2018 0812    Physical Exam:    VS:  BP 110/70   Pulse 75   Ht 5' 4 (1.626 m)   Wt 220 lb (99.8 kg)   SpO2 96%   BMI 37.76 kg/m     Wt Readings from Last 3 Encounters:  05/11/24 220 lb (99.8 kg)  05/09/24 220 lb (99.8 kg)  11/10/23 207 lb (93.9 kg)     GEN:  Well nourished, well developed in no acute distress HEENT: Normal NECK: No JVD; No carotid bruits LYMPHATICS: No lymphadenopathy CARDIAC: RRR, no murmurs, no rubs, no gallops RESPIRATORY:  Clear to auscultation without rales, wheezing or rhonchi  ABDOMEN: Soft, non-tender, non-distended MUSCULOSKELETAL:  No edema; No deformity  SKIN: Warm and dry LOWER EXTREMITIES: no swelling NEUROLOGIC:  Alert and oriented x 3 PSYCHIATRIC:  Normal affect   ASSESSMENT:    1. Supraventricular tachycardia (HCC)   2. Status post gastric bypass for obesity   3. Iron  deficiency anemia, unspecified iron  deficiency anemia type    PLAN:    In order of problems listed above:  Supraventricular tachycardia successfully suppressed with small dose of calcium channel blocker we will continue monitoring. Status post gastric bypass surgery stable. Iron  deficiency anemia-she is scheduled to have iron  infusion.  That is a new problem.   Medication Adjustments/Labs and Tests Ordered: Current medicines are reviewed at length with the patient today.  Concerns regarding medicines are outlined above.  Orders Placed This Encounter  Procedures   EKG 12-Lead   Medication changes: No orders of the defined types were placed in this encounter.   Signed, Lamar DOROTHA Fitch, MD, [facility] 05/11/2024 11:41 AM    [facility]

## 2024-05-11 NOTE — Patient Instructions (Signed)

## 2024-05-12 ENCOUNTER — Other Ambulatory Visit (HOSPITAL_BASED_OUTPATIENT_CLINIC_OR_DEPARTMENT_OTHER): Payer: Self-pay

## 2024-05-12 ENCOUNTER — Other Ambulatory Visit: Payer: Self-pay | Admitting: Cardiology

## 2024-05-12 MED ORDER — DILTIAZEM HCL ER COATED BEADS 120 MG PO CP24
120.0000 mg | ORAL_CAPSULE | Freq: Every day | ORAL | 3 refills | Status: AC
  Filled 2024-05-12: qty 90, 90d supply, fill #0
  Filled 2024-08-10: qty 90, 90d supply, fill #1
  Filled 2024-11-06: qty 90, 90d supply, fill #2

## 2024-05-15 ENCOUNTER — Encounter: Payer: Self-pay | Admitting: Cardiology

## 2024-05-22 NOTE — Telephone Encounter (Signed)
 Patient calling to follow up on letter for GLP1 meds.  Please advise.

## 2024-05-23 ENCOUNTER — Inpatient Hospital Stay

## 2024-05-23 VITALS — BP 110/54 | HR 64 | Resp 18

## 2024-05-23 DIAGNOSIS — D509 Iron deficiency anemia, unspecified: Secondary | ICD-10-CM | POA: Diagnosis not present

## 2024-05-23 DIAGNOSIS — K912 Postsurgical malabsorption, not elsewhere classified: Secondary | ICD-10-CM | POA: Diagnosis not present

## 2024-05-23 DIAGNOSIS — Z9884 Bariatric surgery status: Secondary | ICD-10-CM | POA: Diagnosis not present

## 2024-05-23 MED ORDER — IRON SUCROSE 300 MG IVPB - SIMPLE MED
300.0000 mg | Freq: Once | Status: AC
  Administered 2024-05-23: 300 mg via INTRAVENOUS
  Filled 2024-05-23: qty 300

## 2024-05-23 MED ORDER — SODIUM CHLORIDE 0.9 % IV SOLN: Freq: Once | INTRAVENOUS | Status: AC

## 2024-05-23 NOTE — Patient Instructions (Signed)

## 2024-05-30 ENCOUNTER — Inpatient Hospital Stay

## 2024-05-30 VITALS — BP 110/55 | HR 63 | Temp 98.1°F | Resp 20

## 2024-05-30 DIAGNOSIS — Z9884 Bariatric surgery status: Secondary | ICD-10-CM | POA: Diagnosis not present

## 2024-05-30 DIAGNOSIS — D509 Iron deficiency anemia, unspecified: Secondary | ICD-10-CM | POA: Diagnosis not present

## 2024-05-30 DIAGNOSIS — K912 Postsurgical malabsorption, not elsewhere classified: Secondary | ICD-10-CM | POA: Diagnosis not present

## 2024-05-30 MED ORDER — LORATADINE 10 MG PO TABS: 10.0000 mg | ORAL_TABLET | Freq: Every day | ORAL | Status: DC

## 2024-05-30 MED ORDER — SODIUM CHLORIDE 0.9 % IV SOLN: Freq: Once | INTRAVENOUS | Status: AC

## 2024-05-30 MED ORDER — IRON SUCROSE 300 MG IVPB - SIMPLE MED
300.0000 mg | Freq: Once | Status: AC
  Administered 2024-05-30: 300 mg via INTRAVENOUS
  Filled 2024-05-30: qty 300

## 2024-05-30 NOTE — Patient Instructions (Signed)

## 2024-06-07 ENCOUNTER — Other Ambulatory Visit (HOSPITAL_BASED_OUTPATIENT_CLINIC_OR_DEPARTMENT_OTHER): Payer: Self-pay

## 2024-06-07 ENCOUNTER — Inpatient Hospital Stay: Attending: Hematology & Oncology

## 2024-06-07 ENCOUNTER — Other Ambulatory Visit: Payer: Self-pay

## 2024-06-07 VITALS — BP 125/64 | HR 80 | Temp 97.9°F | Resp 19

## 2024-06-07 DIAGNOSIS — D509 Iron deficiency anemia, unspecified: Secondary | ICD-10-CM | POA: Diagnosis not present

## 2024-06-07 MED ORDER — GABAPENTIN 100 MG PO CAPS
100.0000 mg | ORAL_CAPSULE | Freq: Three times a day (TID) | ORAL | 5 refills | Status: DC
  Filled 2024-06-07: qty 90, 30d supply, fill #0
  Filled 2024-07-04: qty 90, 30d supply, fill #1

## 2024-06-07 MED ORDER — FUROSEMIDE 20 MG PO TABS
20.0000 mg | ORAL_TABLET | Freq: Every day | ORAL | 1 refills | Status: AC
  Filled 2024-06-07: qty 90, 90d supply, fill #0
  Filled 2024-09-15: qty 90, 90d supply, fill #1

## 2024-06-07 MED ORDER — SODIUM CHLORIDE 0.9 % IV SOLN: Freq: Once | INTRAVENOUS | Status: AC

## 2024-06-07 MED ORDER — IRON SUCROSE 300 MG IVPB - SIMPLE MED: 300.0000 mg | Freq: Once | Status: DC

## 2024-06-07 MED ORDER — SODIUM CHLORIDE 0.9 % IV SOLN
300.0000 mg | Freq: Once | INTRAVENOUS | Status: AC
  Administered 2024-06-07: 300 mg via INTRAVENOUS
  Filled 2024-06-07: qty 300

## 2024-06-13 ENCOUNTER — Other Ambulatory Visit: Payer: Self-pay | Admitting: Family

## 2024-06-13 ENCOUNTER — Other Ambulatory Visit (HOSPITAL_BASED_OUTPATIENT_CLINIC_OR_DEPARTMENT_OTHER): Payer: Self-pay

## 2024-06-13 MED ORDER — BUPROPION HCL ER (XL) 150 MG PO TB24
150.0000 mg | ORAL_TABLET | Freq: Every day | ORAL | 1 refills | Status: DC
  Filled 2024-06-13: qty 90, 90d supply, fill #0

## 2024-06-28 ENCOUNTER — Encounter: Payer: Self-pay | Admitting: Family

## 2024-06-28 ENCOUNTER — Other Ambulatory Visit (HOSPITAL_BASED_OUTPATIENT_CLINIC_OR_DEPARTMENT_OTHER): Payer: Self-pay

## 2024-06-28 ENCOUNTER — Ambulatory Visit: Admitting: Family

## 2024-06-28 VITALS — BP 105/47 | HR 69 | Temp 98.2°F | Resp 16 | Ht 64.0 in | Wt 218.0 lb

## 2024-06-28 DIAGNOSIS — R739 Hyperglycemia, unspecified: Secondary | ICD-10-CM | POA: Diagnosis not present

## 2024-06-28 DIAGNOSIS — E538 Deficiency of other specified B group vitamins: Secondary | ICD-10-CM

## 2024-06-28 DIAGNOSIS — F32A Depression, unspecified: Secondary | ICD-10-CM

## 2024-06-28 DIAGNOSIS — Z0000 Encounter for general adult medical examination without abnormal findings: Secondary | ICD-10-CM

## 2024-06-28 DIAGNOSIS — Z23 Encounter for immunization: Secondary | ICD-10-CM | POA: Diagnosis not present

## 2024-06-28 LAB — COMPREHENSIVE METABOLIC PANEL WITH GFR
ALT: 25 U/L (ref 0–35)
AST: 20 U/L (ref 0–37)
Albumin: 4 g/dL (ref 3.5–5.2)
Alkaline Phosphatase: 73 U/L (ref 39–117)
BUN: 14 mg/dL (ref 6–23)
CO2: 27 meq/L (ref 19–32)
Calcium: 8.6 mg/dL (ref 8.4–10.5)
Chloride: 105 meq/L (ref 96–112)
Creatinine, Ser: 0.68 mg/dL (ref 0.40–1.20)
GFR: 101.48 mL/min (ref >60.00)
Glucose, Bld: 81 mg/dL (ref 70–99)
Potassium: 4.2 meq/L (ref 3.5–5.1)
Sodium: 140 meq/L (ref 135–145)
Total Bilirubin: 0.9 mg/dL (ref 0.2–1.2)
Total Protein: 6.3 g/dL (ref 6.0–8.3)

## 2024-06-28 LAB — HEMOGLOBIN A1C: Hgb A1c MFr Bld: 5.3 % (ref 4.6–6.5)

## 2024-06-28 LAB — VITAMIN B12: Vitamin B-12: 606 pg/mL (ref 211–911)

## 2024-06-28 MED ORDER — TIRZEPATIDE-WEIGHT MANAGEMENT 10 MG/0.5ML ~~LOC~~ SOLN: 10.0000 mg | SUBCUTANEOUS | Status: AC

## 2024-06-28 MED ORDER — BUPROPION HCL ER (XL) 300 MG PO TB24
300.0000 mg | ORAL_TABLET | Freq: Every day | ORAL | 0 refills | Status: DC
  Filled 2024-06-28: qty 90, 90d supply, fill #0

## 2024-06-28 NOTE — Progress Notes (Signed)
 Subjective:     Patient ID: Emily [name], female    DOB: 1976/02/17, 48 y.o.   MRN: [id number]  Chief Complaint  Patient presents with   Annual Exam    HPI  Discussed the use of AI scribe software for clinical note transcription with the patient, who gave verbal consent to proceed.  History of Present Illness   Patient presents today for complete physical. She is using Tirzepatide  from a compounding pharmacy.  She has not seen any results yet  Immunizations: prevar today, Shingrix on Friday at nurse visit. Covid she will get at the pharmacy. Pt will get flu shot at work.  Diet: working on it Exercise: 4 x a week gym.  Colonoscopy: 7/23 normal Pap Smear: 9/22 negative, 5 years.   Mammogram: 5/25 negative Vision: up to date Dental: up to date       Health Maintenance Due  Topic Date Due   Zoster Vaccines- Shingrix (1 of 2) Never done   Influenza Vaccine  05/05/2024   COVID-19 Vaccine (5 - 2025-26 season) 06/05/2024    Past Medical History:  Diagnosis Date   B12 deficiency    Benign paroxysmal positional vertigo of right ear 07/15/2021   Depression    Heart murmur    History of COVID-19 2020   per pt mild symptoms that resolved   Hypermetropia of both eyes 09/12/2014   IDA (iron  deficiency anemia)    Impingement syndrome of right shoulder 05/21/2015   Impingement syndrome of right shoulder 05/21/2015   Lactose intolerance    per pt due to gastric bypass   Morbid obesity (HCC) 08/15/2017   Nevus of choroid of left eye 09/12/2014   being monitored   Palpitations    due ot SVT   Psoriasis    Seborrheic dermatitis 02/08/2015   Status post gastric bypass for obesity 12/08/2016   SVT (supraventricular tachycardia)    cardiologist--- dr margean;    normal echo in epic 02-17-2021 and event monitor 02-04-2021  symptomatic SVT w/ 5 brief episodes the longest 14 beats   Vitamin D  deficiency    Wears glasses     Past Surgical History:  Procedure Laterality  Date   COLPOSCOPY  2008   ENDOMETRIAL BIOPSY  2017   negative per pt   FOOT SURGERY Left 12/17/2021   LAPAROSCOPIC CHOLECYSTECTOMY  2006   MASS EXCISION Left 08/17/2017   Procedure: EXCISION SOFT TISSUE  MASS LEFT FLANK;  Surgeon: Eletha Boas, MD;  Location: [facility];  Service: General;  Laterality: Left;   ROUX-EN-Y GASTRIC BYPASS  2007   TENDON REPAIR Left 12/17/2021   Procedure: TENDON REPAIR OF LEFT FOOT;  Surgeon: Gershon Donnice SAUNDERS, DPM;  Location: [facility] [place];  Service: Podiatry;  Laterality: Left;  GENERAL WITH BLOCK    Family History  Problem Relation Age of Onset   Hypertension Mother    Obesity Mother    Hypertension Father    Sleep apnea Father    Obesity Father    Colon polyps Father    COPD Father    ADD / ADHD Sister    Alzheimer's disease Maternal Grandmother    Leukemia Maternal Grandfather    Alzheimer's disease Paternal Grandmother    Cancer Paternal Grandfather    Cancer - Other Paternal Grandfather    Diabetes type II Maternal Aunt    Stroke Maternal Aunt    Multiple sclerosis Maternal Aunt    Heart attack Paternal Aunt    Colon cancer  Neg Hx    Rectal cancer Neg Hx    Stomach cancer Neg Hx    Esophageal cancer Neg Hx     Social History   Socioeconomic History   Marital status: Single    Spouse name: Not on file   Number of children: Not on file   Years of education: Not on file   Highest education level: Master's degree (e.g., MA, MS, MEng, MEd, MSW, MBA)  Occupational History   Occupation: Teacher, adult education: [employer]  Tobacco Use   Smoking status: Never    Passive exposure: Never   Smokeless tobacco: Never  Vaping Use   Vaping status: Never Used  Substance and Sexual Activity   Alcohol use: Not Currently    Alcohol/week: 3.0 standard drinks of alcohol    Types: 3 Cans of beer per week   Drug use: Never   Sexual activity: Yes    Birth control/protection: Pill  Other Topics Concern   Not on file   Social History Narrative   Works as an Charity fundraiser for [employer]   No children   Not married   Has a dog named Doctor, general practice   From [place]- moved to [place] in [place]   Parents are both living   Has one sister back home   Enjoys going out to eat with friends   Enjoys biking and kayaking.    Goes to the GYM 6 days a week   Social Drivers of Corporate investment banker Strain: Low Risk  (06/27/2024)   Overall Financial Resource Strain (CARDIA)    Difficulty of Paying Living Expenses: Not hard at all  Food Insecurity: No Food Insecurity (06/27/2024)   Hunger Vital Sign    Worried About Running Out of Food in the Last Year: Never true    Ran Out of Food in the Last Year: Never true  Transportation Needs: No Transportation Needs (06/27/2024)   PRAPARE - Administrator, Civil Service (Medical): No    Lack of Transportation (Non-Medical): No  Physical Activity: Sufficiently Active (06/27/2024)   Exercise Vital Sign    Days of Exercise per Week: 5 days    Minutes of Exercise per Session: 60 min  Stress: No Stress Concern Present (06/27/2024)   [employer] of Occupational Health - Occupational Stress Questionnaire    Feeling of Stress: Not at all  Social Connections: Moderately Isolated (06/27/2024)   Social Connection and Isolation Panel    Frequency of Communication with Friends and Family: More than three times a week    Frequency of Social Gatherings with Friends and Family: Three times a week    Attends Religious Services: Never    Active Member of Clubs or Organizations: Yes    Attends Engineer, structural: More than 4 times per year    Marital Status: Never married  Intimate Partner Violence: Not on file    Outpatient Medications Prior to Visit  Medication Sig Dispense Refill   B Complex-C (SUPER B COMPLEX PO) Take 1 tablet by mouth daily.     cetirizine (ZYRTEC) 10 MG tablet Take 10 mg by mouth daily.     clobetasol  (TEMOVATE ) 0.05 % external solution Apply 1  application topically 2 (two) times daily. 50 mL 1   clobetasol  ointment (TEMOVATE ) 0.05 % Apply 1 Application topically 2 (two) times daily as needed. 45 g 1   cyanocobalamin  (VITAMIN B12) 1000 MCG/ML injection Inject 1 mL (1,000 mcg total) into the  muscle once a week. 12 mL 4   diltiazem  (CARDIZEM  CD) 120 MG 24 hr capsule Take 1 capsule (120 mg total) by mouth daily. *First attempt, patient needs an appt. for additional refills.* 90 capsule 3   docusate sodium (COLACE) 100 MG capsule Take 100 mg by mouth every other day.     EPINEPHrine  (EPIPEN  2-PAK) 0.3 mg/0.3 mL IJ SOAJ injection Inject 0.3 mg into the muscle as needed for anaphylaxis. 2 each 1   Ferrous Sulfate (IRON ) 325 (65 Fe) MG TABS Take 1 tablet (325 mg total) by mouth every other day. 30 tablet 0   folic acid  (FOLVITE ) 1 MG tablet Take 1 mg by mouth daily.     furosemide  (LASIX ) 20 MG tablet Take 1 tablet (20 mg total) by mouth daily. 90 tablet 1   gabapentin  (NEURONTIN ) 100 MG capsule Take 1 capsule (100 mg total) by mouth 3 (three) times daily. 90 capsule 5   ketoconazole (NIZORAL) 2 % shampoo Apply 1 application. topically daily.     norethindrone -ethinyl estradiol -iron  (BLISOVI  FE 1.5/30) 1.5-30 MG-MCG tablet Take 1 tablet by mouth daily. Skip placebo and take continuously 84 tablet 6   buPROPion  (WELLBUTRIN  XL) 150 MG 24 hr tablet Take 1 tablet (150 mg total) by mouth daily. 90 tablet 1   No facility-administered medications prior to visit.    Allergies  Allergen Reactions   Almond Oil Swelling   Apple Juice Swelling   Kiwi Extract Swelling   Soy Allergy (Obsolete) Itching and Swelling    Mouth/ tongue itch   Strawberry Extract Itching    Mouth/ tongue itch    Review of Systems  Constitutional:  Negative for weight loss.  HENT:  Negative for congestion and hearing loss.   Eyes:  Negative for blurred vision.  Respiratory:  Negative for cough.   Cardiovascular:  Positive for leg swelling.  Gastrointestinal:  Negative  for constipation and diarrhea.  Genitourinary:  Negative for dysuria and frequency.  Musculoskeletal:  Positive for joint pain (some right shoulder pain- mild). Negative for myalgias.  Skin:  Positive for rash (psoriasis on scalp- uses clobetasol ).  Neurological:  Negative for headaches.  Psychiatric/Behavioral:         Denies anxiety       Objective:    Physical Exam   BP (!) 105/47 (BP Location: Right Arm, Patient Position: Sitting, Cuff Size: Large)   Pulse 69   Temp 98.2 F (36.8 C) (Oral)   Resp 16   Ht 5' 4 (1.626 m)   Wt 218 lb (98.9 kg)   SpO2 100%   BMI 37.42 kg/m  Wt Readings from Last 3 Encounters:  06/28/24 218 lb (98.9 kg)  05/11/24 220 lb (99.8 kg)  05/09/24 220 lb (99.8 kg)  Physical Exam  Constitutional: She is oriented to person, place, and time. She appears well-developed and well-nourished. No distress.  HENT:  Head: Normocephalic and atraumatic.  Right Ear: Tympanic membrane and ear canal normal.  Left Ear: Tympanic membrane and ear canal normal.  Mouth/Throat: Oropharynx is clear and moist.  Eyes: Pupils are equal, round, and reactive to light. No scleral icterus.  Neck: Normal range of motion. No thyromegaly present.  Cardiovascular: Normal rate and regular rhythm.   No murmur heard. Pulmonary/Chest: Effort normal and breath sounds normal. No respiratory distress. He has no wheezes. She has no rales. She exhibits no tenderness.  Abdominal: Soft. Bowel sounds are normal. She exhibits no distension and no mass. There is no tenderness. There is  no rebound and no guarding.  Musculoskeletal: She exhibits no edema.  Lymphadenopathy:    She has no cervical adenopathy.  Neurological: She is alert and oriented to person, place, and time. She has normal patellar reflexes. She exhibits normal muscle tone. Coordination normal.  Skin: Skin is warm and dry.  Psychiatric: She has a normal mood and affect. Her behavior is normal. Judgment and thought content  normal.  Breast/Pelvic: deferred      Assessment & Plan:        Assessment & Plan:   Problem List Items Addressed This Visit       Unprioritized   Preventative health care   Continue work on healthy diet, exercise/weight loss.  Prevnar today in office, she will get Shingrix #1 on Friday flu shot at work and covid booster at the pharmacy.   Pap/mammo/colo up to date.       Depression - Primary   Low motivation and irritability despite wellbutrin  XL 150mg . Rec trial of Wellbutrin  xl 300mg  and follow up in 6 weeks.       Relevant Medications   buPROPion  (WELLBUTRIN  XL) 300 MG 24 hr tablet   B12 deficiency   Relevant Orders   B12   Other Visit Diagnoses       Hyperglycemia       Relevant Orders   Comp Met (CMET)   HgB A1c     Need for pneumococcal 20-valent conjugate vaccination       Relevant Orders   Pneumococcal conjugate vaccine 20-valent (Prevnar 20) (Completed)       I have discontinued Suzen Finical Kim's buPROPion . I am also having her start on buPROPion  and tirzepatide . Additionally, I am having her maintain her folic acid , docusate sodium, ketoconazole, Iron , cetirizine, B Complex-C (SUPER B COMPLEX PO), EPINEPHrine , cyanocobalamin , clobetasol , clobetasol  ointment, norethindrone -ethinyl estradiol -iron , diltiazem , gabapentin , and furosemide .  Meds ordered this encounter  Medications   buPROPion  (WELLBUTRIN  XL) 300 MG 24 hr tablet    Sig: Take 1 tablet (300 mg total) by mouth daily.    Dispense:  90 tablet    Refill:  0    Supervising Provider:   DOMENICA BLACKBIRD A [[id number]]   tirzepatide  10 MG/0.5ML injection vial    Sig: Inject 10 mg into the skin once a week.    Supervising Provider:   DOMENICA BLACKBIRD A [phone]

## 2024-06-28 NOTE — Assessment & Plan Note (Signed)
 Continue work on [employer], exercise/weight loss.  Prevnar today in office, she will get Shingrix #1 on Friday flu shot at work and covid booster at the pharmacy.   Pap/mammo/colo up to date.

## 2024-06-28 NOTE — Assessment & Plan Note (Signed)
 Low motivation and irritability despite wellbutrin  XL 150mg . Rec trial of Wellbutrin  xl 300mg  and follow up in 6 weeks.

## 2024-07-03 ENCOUNTER — Ambulatory Visit: Payer: Self-pay | Admitting: Family

## 2024-07-04 ENCOUNTER — Ambulatory Visit (INDEPENDENT_AMBULATORY_CARE_PROVIDER_SITE_OTHER): Payer: Self-pay

## 2024-07-04 DIAGNOSIS — L988 Other specified disorders of the skin and subcutaneous tissue: Secondary | ICD-10-CM

## 2024-07-04 NOTE — Progress Notes (Signed)
 Botulinum Toxin Injection Procedure Note  Procedure: Cosmetic botulinum toxin   Pre-operative Diagnosis: Dynamic rhytides   Post-operative Diagnosis: Same  Complications:  None  Brief history: The patient desires botulinum toxin injection of her forehead. I discussed with the patient this proposed procedure of botulinum toxin injections, which is customized depending on the particular needs of the patient. It is performed on facial rhytids as a temporary correction. The alternatives were discussed with the patient. The risks were addressed including bleeding, scarring, infection, damage to deeper structures, asymmetry, and chronic pain, which may occur infrequently after a procedure. The individual's choice to undergo a surgical procedure is based on the comparison of risks to potential benefits. Other risks include unsatisfactory results, brow ptosis, eyelid ptosis, allergic reaction, temporary paralysis, which should go away with time, bruising, blurring disturbances and delayed healing. Botulinum toxin injections do not arrest the aging process or produce permanent tightening of the eyelid.  Operative intervention maybe necessary to maintain the results of a blepharoplasty or botulinum toxin. The patient understands and wishes to proceed.  Procedure: The area was prepped with chlorhexidine  and dried with a clean gauze. Using a clean technique, the botulinum toxin was diluted with 1.25 cc of preservative-free normal saline which was slowly injected with an 18 gauge needle in a tuberculin syringes.  A 32 gauge needles were then used to inject the botulinum toxin. This mixture allow for an aliquot of 4 units per 0.1 cc in each injection site.    Subsequently the mixture was injected in the crows feet, glabellar and forehead area with preservation of the temporal branch to the lateral eyebrow as well as into each lateral canthal area beginning from the lateral orbital rim medial to the zygomaticus major  in 3 separate areas. A total of 45 Units of botulinum toxin was used. The forehead and glabellar area was injected with care to inject intramuscular only while holding pressure on the supratrochlear vessels in each area during each injection on either side of the medial corrugators. The injection proceeded vertically superiorly to the medial 2/3 of the frontalis muscle and superior 2/3 of the lateral frontalis, again with preservation of the frontal branch.  Botox LOT:  [id number]  EXP:  10/27  Nieves HERO. Kamrin Sibley, MD [facility]

## 2024-07-20 DIAGNOSIS — H5203 Hypermetropia, bilateral: Secondary | ICD-10-CM | POA: Diagnosis not present

## 2024-07-20 DIAGNOSIS — H524 Presbyopia: Secondary | ICD-10-CM | POA: Diagnosis not present

## 2024-07-20 DIAGNOSIS — H52223 Regular astigmatism, bilateral: Secondary | ICD-10-CM | POA: Diagnosis not present

## 2024-07-24 ENCOUNTER — Other Ambulatory Visit: Payer: Self-pay | Admitting: Medical Genetics

## 2024-07-24 DIAGNOSIS — Z006 Encounter for examination for normal comparison and control in clinical research program: Secondary | ICD-10-CM

## 2024-08-08 ENCOUNTER — Ambulatory Visit (INDEPENDENT_AMBULATORY_CARE_PROVIDER_SITE_OTHER): Admitting: Family

## 2024-08-08 ENCOUNTER — Other Ambulatory Visit (HOSPITAL_BASED_OUTPATIENT_CLINIC_OR_DEPARTMENT_OTHER): Payer: Self-pay

## 2024-08-08 VITALS — BP 107/44 | HR 70 | Temp 97.6°F | Resp 16 | Ht 64.0 in | Wt 213.0 lb

## 2024-08-08 DIAGNOSIS — F32A Depression, unspecified: Secondary | ICD-10-CM | POA: Diagnosis not present

## 2024-08-08 DIAGNOSIS — E785 Hyperlipidemia, unspecified: Secondary | ICD-10-CM | POA: Diagnosis not present

## 2024-08-08 DIAGNOSIS — E538 Deficiency of other specified B group vitamins: Secondary | ICD-10-CM | POA: Diagnosis not present

## 2024-08-08 MED ORDER — GABAPENTIN 100 MG PO CAPS
100.0000 mg | ORAL_CAPSULE | Freq: Two times a day (BID) | ORAL | 1 refills | Status: AC
  Filled 2024-08-08: qty 180, 90d supply, fill #0

## 2024-08-08 MED ORDER — CLOBETASOL PROPIONATE 0.05 % EX SOLN
1.0000 | Freq: Two times a day (BID) | CUTANEOUS | 1 refills | Status: AC
  Filled 2024-08-08: qty 50, 25d supply, fill #0

## 2024-08-08 MED ORDER — SERTRALINE HCL 50 MG PO TABS
50.0000 mg | ORAL_TABLET | Freq: Every day | ORAL | 0 refills | Status: DC
  Filled 2024-08-08: qty 90, 90d supply, fill #0

## 2024-08-08 MED ORDER — CYANOCOBALAMIN 1000 MCG/ML IJ SOLN
1000.0000 ug | INTRAMUSCULAR | 4 refills | Status: AC
  Filled 2024-08-08: qty 12, 84d supply, fill #0
  Filled 2024-10-27: qty 12, 84d supply, fill #1

## 2024-08-08 NOTE — Patient Instructions (Signed)
 VISIT SUMMARY:  During your visit, we discussed your ongoing mood issues, neuropathic pain, scalp dermatosis, and vitamin B12 deficiency. We made some adjustments to your medications to better manage your symptoms.  YOUR PLAN:  DEPRESSION: Your depression has not improved with the increased dose of Wellbutrin . -We will start you on sertraline 50 mg. Begin with half a tablet at bedtime for one week, then increase to a full tablet if tolerated. -Monitor for side effects such as drowsiness, yawning, nausea, or diarrhea. -Continue your current dose of Wellbutrin  and we will reassess in 4-6 weeks.  NEUROPATHIC PAIN: Your neuropathic pain is being managed with gabapentin . -We will prescribe a 90-day supply of gabapentin  (180 tablets).  SCALP DERMATOSIS: You requested a refill for your clobetasol  solution. -We will refill your clobetasol  solution.  VITAMIN B12 DEFICIENCY: Your vitamin B12 deficiency is managed with weekly injections. -We will refill your B12 injections with additional refills.

## 2024-08-08 NOTE — Progress Notes (Signed)
 Subjective:     Patient ID: Emily [name], female    DOB: 07/26/1976, 48 y.o.   MRN: [id number]  Chief Complaint  Patient presents with   Follow-up    Patient reports no improvement with increased of wellbutrin     HPI  Discussed the use of AI scribe software for clinical note transcription with the patient, who gave verbal consent to proceed.  History of Present Illness   The patient is a 48 year old female who presents for follow up of her depression. Last visit we increased her wellbutrin  xl from 150mg  to 300mg .  She notes persistent mood issues despite medication adjustments.  Vivid dreams occur, similar to those experienced on Contrave , but without distressing content.   Current medications include Wellbutrin , gabapentin  twice daily, and a weekly B12 regimen. She has not tried other medications for mood issues besides Wellbutrin .  She requested refills for clonidazole solution, gabapentin , and B12.   Health Maintenance Due  Topic Date Due   Zoster Vaccines- Shingrix (1 of 2) Never done   COVID-19 Vaccine (5 - 2025-26 season) 06/05/2024    Past Medical History:  Diagnosis Date   B12 deficiency    Benign paroxysmal positional vertigo of right ear 07/15/2021   Depression    Heart murmur    History of COVID-19 2020   per pt mild symptoms that resolved   Hypermetropia of both eyes 09/12/2014   IDA (iron  deficiency anemia)    Impingement syndrome of right shoulder 05/21/2015   Impingement syndrome of right shoulder 05/21/2015   Lactose intolerance    per pt due to gastric bypass   Morbid obesity (HCC) 08/15/2017   Nevus of choroid of left eye 09/12/2014   being monitored   Palpitations    due ot SVT   Psoriasis    Seborrheic dermatitis 02/08/2015   Status post gastric bypass for obesity 12/08/2016   SVT (supraventricular tachycardia)    cardiologist--- dr margean;    normal echo in epic 02-17-2021 and event monitor 02-04-2021  symptomatic SVT w/ 5 brief  episodes the longest 14 beats   Vitamin D  deficiency    Wears glasses     Past Surgical History:  Procedure Laterality Date   COLPOSCOPY  2008   ENDOMETRIAL BIOPSY  2017   negative per pt   FOOT SURGERY Left 12/17/2021   LAPAROSCOPIC CHOLECYSTECTOMY  2006   MASS EXCISION Left 08/17/2017   Procedure: EXCISION SOFT TISSUE  MASS LEFT FLANK;  Surgeon: Eletha Boas, MD;  Location: [facility];  Service: General;  Laterality: Left;   ROUX-EN-Y GASTRIC BYPASS  2007   TENDON REPAIR Left 12/17/2021   Procedure: TENDON REPAIR OF LEFT FOOT;  Surgeon: Gershon Donnice SAUNDERS, DPM;  Location: [facility];  Service: Podiatry;  Laterality: Left;  GENERAL WITH BLOCK    Family History  Problem Relation Age of Onset   Hypertension Mother    Obesity Mother    Hypertension Father    Sleep apnea Father    Obesity Father    Colon polyps Father    COPD Father    ADD / ADHD Sister    Alzheimer's disease Maternal Grandmother    Leukemia Maternal Grandfather    Alzheimer's disease Paternal Grandmother    Cancer Paternal Grandfather    Cancer - Other Paternal Grandfather    Diabetes type II Maternal Aunt    Stroke Maternal Aunt    Multiple sclerosis Maternal Aunt    Heart attack Paternal Aunt  Colon cancer Neg Hx    Rectal cancer Neg Hx    Stomach cancer Neg Hx    Esophageal cancer Neg Hx     Social History   Socioeconomic History   Marital status: Single    Spouse name: Not on file   Number of children: Not on file   Years of education: Not on file   Highest education level: Master's degree (e.g., MA, MS, MEng, MEd, MSW, MBA)  Occupational History   Occupation: Teacher, Adult Education: [employer]  Tobacco Use   Smoking status: Never    Passive exposure: Never   Smokeless tobacco: Never  Vaping Use   Vaping status: Never Used  Substance and Sexual Activity   Alcohol use: Not Currently    Alcohol/week: 3.0 standard drinks of alcohol    Types: 3 Cans of beer per  week   Drug use: Never   Sexual activity: Yes    Birth control/protection: Pill  Other Topics Concern   Not on file  Social History Narrative   Works as an CHARITY FUNDRAISER for [employer]   No children   Not married   Has a dog named Doctor, General Practice   From [place]- moved to [place] in 2002   Parents are both living   Has one sister back home   Enjoys going out to eat with friends   Enjoys biking and kayaking.    Goes to the GYM 6 days a week   Social Drivers of Corporate Investment Banker Strain: Low Risk  (08/03/2024)   Overall Financial Resource Strain (CARDIA)    Difficulty of Paying Living Expenses: Not hard at all  Food Insecurity: No Food Insecurity (08/03/2024)   Hunger Vital Sign    Worried About Running Out of Food in the Last Year: Never true    Ran Out of Food in the Last Year: Never true  Transportation Needs: No Transportation Needs (08/03/2024)   PRAPARE - Administrator, Civil Service (Medical): No    Lack of Transportation (Non-Medical): No  Physical Activity: Sufficiently Active (08/03/2024)   Exercise Vital Sign    Days of Exercise per Week: 4 days    Minutes of Exercise per Session: 60 min  Stress: Stress Concern Present (08/03/2024)   [employer] of Occupational Health - Occupational Stress Questionnaire    Feeling of Stress: To some extent  Social Connections: Socially Isolated (08/03/2024)   Social Connection and Isolation Panel    Frequency of Communication with Friends and Family: More than three times a week    Frequency of Social Gatherings with Friends and Family: Three times a week    Attends Religious Services: Never    Active Member of Clubs or Organizations: No    Attends Engineer, Structural: Not on file    Marital Status: Never married  Intimate Partner Violence: Not on file    Outpatient Medications Prior to Visit  Medication Sig Dispense Refill   B Complex-C (SUPER B COMPLEX PO) Take 1 tablet by mouth daily.     buPROPion   (WELLBUTRIN  XL) 300 MG 24 hr tablet Take 1 tablet (300 mg total) by mouth daily. 90 tablet 0   cetirizine (ZYRTEC) 10 MG tablet Take 10 mg by mouth daily.     clobetasol  ointment (TEMOVATE ) 0.05 % Apply 1 Application topically 2 (two) times daily as needed. 45 g 1   diltiazem  (CARDIZEM  CD) 120 MG 24 hr capsule Take 1 capsule (120 mg  total) by mouth daily. *First attempt, patient needs an appt. for additional refills.* 90 capsule 3   docusate sodium (COLACE) 100 MG capsule Take 100 mg by mouth every other day.     EPINEPHrine  (EPIPEN  2-PAK) 0.3 mg/0.3 mL IJ SOAJ injection Inject 0.3 mg into the muscle as needed for anaphylaxis. 2 each 1   Ferrous Sulfate (IRON ) 325 (65 Fe) MG TABS Take 1 tablet (325 mg total) by mouth every other day. 30 tablet 0   folic acid  (FOLVITE ) 1 MG tablet Take 1 mg by mouth daily.     furosemide  (LASIX ) 20 MG tablet Take 1 tablet (20 mg total) by mouth daily. 90 tablet 1   ketoconazole (NIZORAL) 2 % shampoo Apply 1 application. topically daily.     norethindrone -ethinyl estradiol -iron  (BLISOVI  FE 1.5/30) 1.5-30 MG-MCG tablet Take 1 tablet by mouth daily. Skip placebo and take continuously 84 tablet 6   tirzepatide  10 MG/0.5ML injection vial Inject 10 mg into the skin once a week.     clobetasol  (TEMOVATE ) 0.05 % external solution Apply 1 application topically 2 (two) times daily. 50 mL 1   cyanocobalamin  (VITAMIN B12) 1000 MCG/ML injection Inject 1 mL (1,000 mcg total) into the muscle once a week. 12 mL 4   gabapentin  (NEURONTIN ) 100 MG capsule Take 1 capsule (100 mg total) by mouth 3 (three) times daily. 90 capsule 5   No facility-administered medications prior to visit.    Allergies  Allergen Reactions   Almond Oil Swelling   Apple Juice Swelling   Kiwi Extract Swelling   Soy Allergy (Obsolete) Itching and Swelling    Mouth/ tongue itch   Strawberry Extract Itching    Mouth/ tongue itch    ROS See HPI    Objective:    Physical Exam Constitutional:       Appearance: She is well-developed.  Cardiovascular:     Rate and Rhythm: Normal rate and regular rhythm.     Heart sounds: Normal heart sounds. No murmur heard. Pulmonary:     Effort: Pulmonary effort is normal. No respiratory distress.     Breath sounds: Normal breath sounds. No wheezing.  Psychiatric:        Attention and Perception: Attention normal.        Speech: Speech normal.        Behavior: Behavior normal. Behavior is cooperative.        Thought Content: Thought content normal.        Cognition and Memory: Cognition normal.        Judgment: Judgment normal.      BP (!) 107/44 (BP Location: Right Arm, Patient Position: Sitting, Cuff Size: Large)   Pulse 70   Temp 97.6 F (36.4 C) (Oral)   Resp 16   Ht 5' 4 (1.626 m)   Wt 213 lb (96.6 kg)   SpO2 100%   BMI 36.56 kg/m  Wt Readings from Last 3 Encounters:  08/08/24 213 lb (96.6 kg)  06/28/24 218 lb (98.9 kg)  05/11/24 220 lb (99.8 kg)       Assessment & Plan:   Problem List Items Addressed This Visit       Unprioritized   Vitamin B 12 deficiency   Relevant Medications   cyanocobalamin  (VITAMIN B12) 1000 MCG/ML injection   Depression   Uncontrolled on Wellbutrin  XL despite increase from 150 mg to 300 mg. he patient presents with persistent mood issues despite medication adjustments.  There is no improvement in mood following a recent  increase in Wellbutrin . Vivid dreams occur, similar to those experienced on Contrave , but without distressing content, leading to exhaustion upon waking.  Current medications include Wellbutrin , gabapentin  twice daily, and a weekly B12 regimen. They have not tried other medications for mood issues besides Wellbutrin .  They requested refills for clonidazole solution, gabapentin , and B12. They are concerned about managing their medications efficiently before the new year.      Relevant Medications   sertraline (ZOLOFT) 50 MG tablet   Other Visit Diagnoses        Hyperlipidemia, unspecified hyperlipidemia type    -  Primary   Relevant Medications   sertraline (ZOLOFT) 50 MG tablet       I have changed Suzen Finical Kim's gabapentin . I am also having her start on sertraline. Additionally, I am having her maintain her folic acid , docusate sodium, ketoconazole, Iron , cetirizine, B Complex-C (SUPER B COMPLEX PO), EPINEPHrine , clobetasol  ointment, norethindrone -ethinyl estradiol -iron , diltiazem , furosemide , buPROPion , tirzepatide , clobetasol , and cyanocobalamin .  Meds ordered this encounter  Medications   sertraline (ZOLOFT) 50 MG tablet    Sig: Take 1 tablet (50 mg total) by mouth daily.    Dispense:  90 tablet    Refill:  0    Supervising Provider:   DOMENICA BLACKBIRD A [[id number]]   clobetasol  (TEMOVATE ) 0.05 % external solution    Sig: Apply 1 application topically 2 (two) times daily.    Dispense:  50 mL    Refill:  1    Supervising Provider:   DOMENICA BLACKBIRD A [[id number]]   gabapentin  (NEURONTIN ) 100 MG capsule    Sig: Take 1 capsule (100 mg total) by mouth 2 (two) times daily.    Dispense:  180 capsule    Refill:  1    Supervising Provider:   DOMENICA BLACKBIRD A [[id number]]   cyanocobalamin  (VITAMIN B12) 1000 MCG/ML injection    Sig: Inject 1 mL (1,000 mcg total) into the muscle once a week.    Dispense:  12 mL    Refill:  4    Supervising Provider:   DOMENICA BLACKBIRD A [[id number]]

## 2024-08-08 NOTE — Assessment & Plan Note (Signed)
 Uncontrolled on Wellbutrin  XL despite increase from 150 mg to 300 mg. he patient presents with persistent mood issues despite medication adjustments.  There is no improvement in mood following a recent increase in Wellbutrin . Vivid dreams occur, similar to those experienced on Contrave , but without distressing content, leading to exhaustion upon waking.  Current medications include Wellbutrin , gabapentin  twice daily, and a weekly B12 regimen. They have not tried other medications for mood issues besides Wellbutrin .  They requested refills for clonidazole solution, gabapentin , and B12. They are concerned about managing their medications efficiently before the new year.

## 2024-08-09 ENCOUNTER — Other Ambulatory Visit: Payer: Self-pay

## 2024-08-09 ENCOUNTER — Ambulatory Visit: Admitting: Family

## 2024-08-14 ENCOUNTER — Other Ambulatory Visit: Payer: Self-pay

## 2024-08-14 ENCOUNTER — Other Ambulatory Visit (HOSPITAL_BASED_OUTPATIENT_CLINIC_OR_DEPARTMENT_OTHER): Payer: Self-pay

## 2024-08-23 DIAGNOSIS — L814 Other melanin hyperpigmentation: Secondary | ICD-10-CM | POA: Diagnosis not present

## 2024-08-23 DIAGNOSIS — L821 Other seborrheic keratosis: Secondary | ICD-10-CM | POA: Diagnosis not present

## 2024-08-23 DIAGNOSIS — L400 Psoriasis vulgaris: Secondary | ICD-10-CM | POA: Diagnosis not present

## 2024-08-23 DIAGNOSIS — D485 Neoplasm of uncertain behavior of skin: Secondary | ICD-10-CM | POA: Diagnosis not present

## 2024-08-23 DIAGNOSIS — D1801 Hemangioma of skin and subcutaneous tissue: Secondary | ICD-10-CM | POA: Diagnosis not present

## 2024-09-11 ENCOUNTER — Encounter: Payer: Self-pay | Admitting: Hematology & Oncology

## 2024-09-11 ENCOUNTER — Telehealth: Payer: Self-pay | Admitting: Pharmacist

## 2024-09-11 ENCOUNTER — Other Ambulatory Visit (HOSPITAL_BASED_OUTPATIENT_CLINIC_OR_DEPARTMENT_OTHER): Payer: Self-pay

## 2024-09-11 ENCOUNTER — Other Ambulatory Visit: Payer: Self-pay

## 2024-09-11 ENCOUNTER — Encounter (HOSPITAL_COMMUNITY): Payer: Self-pay

## 2024-09-11 MED ORDER — OTEZLA XR 75 MG PO TB24
1.0000 | ORAL_TABLET | Freq: Every day | ORAL | 2 refills | Status: DC
  Filled 2024-09-11: qty 30, 30d supply, fill #0

## 2024-09-11 NOTE — Progress Notes (Signed)
 Pharmacy Patient Advocate Encounter  Insurance verification completed.   The patient is insured through [facility]   Ran test claim for Otezla  XR. Co-pay is $0. Patient has copay card.   This test claim was processed through [facility]- copay amounts may vary at other pharmacies due to pharmacy/plan contracts, or as the patient moves through the different stages of their insurance plan.

## 2024-09-11 NOTE — Progress Notes (Signed)
 Employee. Sent to [employer] for clinical onboarding/re-write

## 2024-09-11 NOTE — Telephone Encounter (Signed)
 Called patient to schedule an appointment for the [facility] Specialty Medication [facility]. I was unable to reach the patient so I left a HIPAA-compliant message requesting that the patient return my call.   Emily [name], PharmD, JAQUELINE, CPP Clinical Pharmacist [facility] & [facility] [phone]

## 2024-09-12 ENCOUNTER — Other Ambulatory Visit: Payer: Self-pay | Admitting: Pharmacist

## 2024-09-12 ENCOUNTER — Encounter: Payer: Self-pay | Admitting: Pharmacist

## 2024-09-12 ENCOUNTER — Encounter (HOSPITAL_COMMUNITY): Payer: Self-pay

## 2024-09-12 ENCOUNTER — Other Ambulatory Visit: Payer: Self-pay

## 2024-09-12 ENCOUNTER — Ambulatory Visit: Attending: Family | Admitting: Pharmacist

## 2024-09-12 ENCOUNTER — Other Ambulatory Visit (HOSPITAL_COMMUNITY): Payer: Self-pay

## 2024-09-12 DIAGNOSIS — Z7189 Other specified counseling: Secondary | ICD-10-CM

## 2024-09-12 MED ORDER — OTEZLA XR 75 MG PO TB24
1.0000 | ORAL_TABLET | Freq: Every day | ORAL | 2 refills | Status: AC
  Filled 2024-09-12: qty 30, fill #0
  Filled 2024-09-12: qty 30, 30d supply, fill #0
  Filled 2024-10-04: qty 30, 30d supply, fill #1
  Filled 2024-11-08: qty 30, 30d supply, fill #2

## 2024-09-12 NOTE — Progress Notes (Signed)
 See OV from 09/12/24 for complete documentation.   Herlene Fleeta Morris, PharmD, JAQUELINE, CPP Clinical Pharmacist [facility] & [facility] [phone]

## 2024-09-12 NOTE — Progress Notes (Signed)
 Specialty Pharmacy Initial Fill Coordination Note  Emily [name] is a 47 y.o. female contacted today regarding initial fill of specialty medication(s) Apremilast  (Otezla  XR)   Patient requested Delivery   Delivery date: 09/15/24   Verified address: [address]   Medication will be filled on: 09/14/24   Patient is aware of $0 copayment.

## 2024-09-12 NOTE — Progress Notes (Signed)
  S: Patient presents for review of their specialty medication therapy.  Patient is currently taking Otezla  for psoriasis. Patient is managed by Dr. Dietrich for this.   Adherence: has not started   Efficacy: has not started   Dosing:  Active psoriatic arthritis or plaque psoriasis (moderate to severe): Oral: Initial: 10 mg in the morning. Titrate upward by additional 10 mg per day on days 2 to 5 as follows: Day 2: 10 mg twice daily; Day 3: 10 mg in the morning and 20 mg in the evening; Day 4: 20 mg twice daily; Day 5: 20 mg in the morning and 30 mg in the evening.   Maintenance dose: 30 mg twice daily starting on day 6 OR 75 mg XR once daily   Current adverse effects: Headache: none  GI upset: none  Weight loss: none  Neuropsychiatric effects: none  O:     Lab Results  Component Value Date   WBC 5.9 05/09/2024   HGB 12.7 05/09/2024   HCT 39.6 05/09/2024   MCV 86.7 05/09/2024   PLT 296 05/09/2024      Chemistry      Component Value Date/Time   NA 140 06/28/2024 0914   NA 142 02/07/2018 0812   K 4.2 06/28/2024 0914   CL 105 06/28/2024 0914   CO2 27 06/28/2024 0914   BUN 14 06/28/2024 0914   BUN 15 02/07/2018 0812   CREATININE 0.68 06/28/2024 0914   CREATININE 0.84 10/28/2022 1016      Component Value Date/Time   CALCIUM 8.6 06/28/2024 0914   ALKPHOS 73 06/28/2024 0914   AST 20 06/28/2024 0914   AST 16 10/28/2022 1016   ALT 25 06/28/2024 0914   ALT 15 10/28/2022 1016   BILITOT 0.9 06/28/2024 0914   BILITOT 1.1 10/28/2022 1016       A/P: 1. Medication review: patient is currently on Otezla  for psoriasis. Reviewed the medication including the following: apremilast  inhibits phosphodiesterase 4 (PDE4) specific for cyclic adenosine monophosphate (cAMP) which results in increased intracellular cAMP levels and regulation of numerous inflammatory mediators (eg, decreased expression of nitric oxide synthase, TNF-alpha, and interleukin [IL]-23, as well as increased  IL-10. Patient educated on purpose, proper use and potential adverse effects of Otezla . Possible adverse effects include weight loss, GI upset, headache, and mood changes. Renal function should be routinely monitored. Administer without regard to food. Do not crush, chew, or split tablets. No recommendations for any changes at this time.  Herlene Fleeta Morris, PharmD, JAQUELINE, CPP Clinical Pharmacist [facility] & [facility] [phone]

## 2024-09-13 ENCOUNTER — Other Ambulatory Visit (HOSPITAL_COMMUNITY): Payer: Self-pay

## 2024-09-13 ENCOUNTER — Other Ambulatory Visit: Payer: Self-pay

## 2024-09-13 LAB — HEMOGLOBIN A1C
EGFR: 102
Hemoglobin A1C: 4.8

## 2024-09-13 LAB — LIPID PANEL
Cholesterol: 176 (ref 0–200)
HDL: 100 — AB (ref 35–70)
LDL Cholesterol: 62
LDl/HDL Ratio: 1.8
Triglycerides: 60 (ref 40–160)

## 2024-09-13 LAB — HEPATIC FUNCTION PANEL
ALT: 19 U/L (ref 7–35)
AST: 18 (ref 13–35)
Alkaline Phosphatase: 76 (ref 25–125)
Bilirubin, Total: 1.2

## 2024-09-13 LAB — BASIC METABOLIC PANEL WITH GFR
BUN: 15 (ref 4–21)
CO2: 27 — AB (ref 13–22)
Chloride: 104 (ref 99–108)
Creatinine: 0.7 (ref 0.5–1.1)
Glucose: 74
Potassium: 4.2 meq/L (ref 3.5–5.1)
Sodium: 139 (ref 137–147)

## 2024-09-13 LAB — TSH: TSH: 2.45 (ref 0.41–5.90)

## 2024-09-13 LAB — COMPREHENSIVE METABOLIC PANEL WITH GFR
Albumin: 3.9 (ref 3.5–5.0)
Calcium: 8.8 (ref 8.7–10.7)
Globulin: 2.5

## 2024-09-14 ENCOUNTER — Other Ambulatory Visit: Payer: Self-pay

## 2024-09-14 ENCOUNTER — Other Ambulatory Visit (HOSPITAL_COMMUNITY): Payer: Self-pay

## 2024-09-15 ENCOUNTER — Other Ambulatory Visit (HOSPITAL_BASED_OUTPATIENT_CLINIC_OR_DEPARTMENT_OTHER): Payer: Self-pay

## 2024-09-18 ENCOUNTER — Other Ambulatory Visit: Payer: Self-pay

## 2024-09-18 ENCOUNTER — Other Ambulatory Visit (HOSPITAL_COMMUNITY): Payer: Self-pay

## 2024-09-18 NOTE — Progress Notes (Signed)
 09/18/2024 Otezla  was returned to pharmacy-LVM for patient Emily [name]

## 2024-09-19 ENCOUNTER — Other Ambulatory Visit: Payer: Self-pay

## 2024-09-20 ENCOUNTER — Other Ambulatory Visit (HOSPITAL_BASED_OUTPATIENT_CLINIC_OR_DEPARTMENT_OTHER): Payer: Self-pay

## 2024-09-20 ENCOUNTER — Other Ambulatory Visit: Payer: Self-pay | Admitting: Family

## 2024-09-20 ENCOUNTER — Ambulatory Visit (INDEPENDENT_AMBULATORY_CARE_PROVIDER_SITE_OTHER): Admitting: Family

## 2024-09-20 VITALS — BP 107/55 | HR 74 | Temp 98.4°F | Resp 100 | Ht 64.0 in | Wt 201.0 lb

## 2024-09-20 DIAGNOSIS — F3289 Other specified depressive episodes: Secondary | ICD-10-CM | POA: Diagnosis not present

## 2024-09-20 DIAGNOSIS — F32A Depression, unspecified: Secondary | ICD-10-CM

## 2024-09-20 MED ORDER — SERTRALINE HCL 50 MG PO TABS
50.0000 mg | ORAL_TABLET | Freq: Every day | ORAL | 0 refills | Status: AC
  Filled 2024-09-20 – 2024-10-31 (×2): qty 90, 90d supply, fill #0

## 2024-09-20 MED ORDER — BUPROPION HCL ER (XL) 300 MG PO TB24
300.0000 mg | ORAL_TABLET | Freq: Every day | ORAL | 0 refills | Status: AC
  Filled 2024-09-20: qty 90, 90d supply, fill #0

## 2024-09-20 NOTE — Progress Notes (Signed)
 Subjective:     Patient ID: Emily [name], female    DOB: [birth year], 48 y.o.   MRN: [id number]  Chief Complaint  Patient presents with   Follow-up    Follow up after starting new medication    HPI  Discussed the use of AI scribe software for clinical note transcription with the patient, who gave verbal consent to proceed.  History of Present Illness Emily [name] is a 48 year old female who presents for follow-up regarding depression management.  She is currently on Wellbutrin  300 mg, and Zoloft  50mg  (which was added to her regimen last visit). She notes some improvement in her mood, stating she is not as frustrated or irritable. However, she does not feel more motivated to engage in activities on her days off, attributing this partly to the winter season and her dislike of the cold.  She describes her weekends as mostly spent alone at home, preferring solitude, and notes that she was more socially active last winter compared to this year. No anxiety is reported. She mentions having trouble concentrating on several days, though she is unsure if it is more frequent than that.     09/20/2024    3:40 PM 06/28/2024    8:35 AM 05/30/2024   12:58 PM  PHQ9 SCORE ONLY  PHQ-9 Total Score 3 2 0        Health Maintenance Due  Topic Date Due   Zoster Vaccines- Shingrix (1 of 2) Never done   COVID-19 Vaccine (5 - 2025-26 season) 06/05/2024    Past Medical History:  Diagnosis Date   B12 deficiency    Benign paroxysmal positional vertigo of right ear 07/15/2021   Depression    Heart murmur    History of COVID-19 2020   per pt mild symptoms that resolved   Hypermetropia of both eyes 09/12/2014   IDA (iron  deficiency anemia)    Impingement syndrome of right shoulder 05/21/2015   Impingement syndrome of right shoulder 05/21/2015   Lactose intolerance    per pt due to gastric bypass   Morbid obesity (HCC) 08/15/2017   Nevus of choroid of left eye 09/12/2014   being  monitored   Palpitations    due ot SVT   Psoriasis    Seborrheic dermatitis 02/08/2015   Status post gastric bypass for obesity 12/08/2016   SVT (supraventricular tachycardia)    cardiologist--- dr margean;    normal echo in epic 02-17-2021 and event monitor 02-04-2021  symptomatic SVT w/ 5 brief episodes the longest 14 beats   Vitamin D  deficiency    Wears glasses     Past Surgical History:  Procedure Laterality Date   COLPOSCOPY  2008   ENDOMETRIAL BIOPSY  2017   negative per pt   FOOT SURGERY Left 12/17/2021   LAPAROSCOPIC CHOLECYSTECTOMY  2006   MASS EXCISION Left 08/17/2017   Procedure: EXCISION SOFT TISSUE  MASS LEFT FLANK;  Surgeon: Eletha Boas, MD;  Location: [facility];  Service: General;  Laterality: Left;   ROUX-EN-Y GASTRIC BYPASS  2007   TENDON REPAIR Left 12/17/2021   Procedure: TENDON REPAIR OF LEFT FOOT;  Surgeon: Gershon Donnice SAUNDERS, DPM;  Location: [facility] [place];  Service: Podiatry;  Laterality: Left;  GENERAL WITH BLOCK    Family History  Problem Relation Age of Onset   Hypertension Mother    Obesity Mother    Hypertension Father    Sleep apnea Father    Obesity Father    Colon  polyps Father    COPD Father    ADD / ADHD Sister    Alzheimer's disease Maternal Grandmother    Leukemia Maternal Grandfather    Alzheimer's disease Paternal Grandmother    Cancer Paternal Grandfather    Cancer - Other Paternal Grandfather    Diabetes type II Maternal Aunt    Stroke Maternal Aunt    Multiple sclerosis Maternal Aunt    Heart attack Paternal Aunt    Colon cancer Neg Hx    Rectal cancer Neg Hx    Stomach cancer Neg Hx    Esophageal cancer Neg Hx     Social History   Socioeconomic History   Marital status: Single    Spouse name: Not on file   Number of children: Not on file   Years of education: Not on file   Highest education level: Master's degree (e.g., MA, MS, MEng, MEd, MSW, MBA)  Occupational History   Occupation:  Teacher, Adult Education: [employer]  Tobacco Use   Smoking status: Never    Passive exposure: Never   Smokeless tobacco: Never  Vaping Use   Vaping status: Never Used  Substance and Sexual Activity   Alcohol use: Not Currently    Alcohol/week: 3.0 standard drinks of alcohol    Types: 3 Cans of beer per week   Drug use: Never   Sexual activity: Yes    Birth control/protection: Pill  Other Topics Concern   Not on file  Social History Narrative   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    Goes to the GYM 6 days a week   Social Drivers of Health   Tobacco Use: Low Risk (09/12/2024)   Patient History    Smoking Tobacco Use: Never    Smokeless Tobacco Use: Never    Passive Exposure: Never  Financial Resource Strain: Low Risk (08/03/2024)   Overall Financial Resource Strain (CARDIA)    Difficulty of Paying Living Expenses: Not hard at all  Food Insecurity: No Food Insecurity (08/03/2024)   Epic    Worried About Programme Researcher, Broadcasting/film/video in the Last Year: Never true    Ran Out of Food in the Last Year: Never true  Transportation Needs: No Transportation Needs (08/03/2024)   Epic    Lack of Transportation (Medical): No    Lack of Transportation (Non-Medical): No  Physical Activity: Sufficiently Active (08/03/2024)   Exercise Vital Sign    Days of Exercise per Week: 4 days    Minutes of Exercise per Session: 60 min  Stress: Stress Concern Present (08/03/2024)   [employer] of Occupational Health - Occupational Stress Questionnaire    Feeling of Stress: To some extent  Social Connections: Socially Isolated (08/03/2024)   Social Connection and Isolation Panel    Frequency of Communication with Friends and Family: More than three times a week    Frequency of Social  Gatherings with Friends and Family: Three times a week    Attends Religious Services: Never    Active Member of Clubs or Organizations: No    Attends Banker Meetings: Not on file    Marital Status: Never married  Intimate Partner Violence:  Not on file  Depression (PHQ2-9): Low Risk (09/20/2024)   Depression (PHQ2-9)    PHQ-2 Score: 3  Alcohol Screen: Low Risk (08/03/2024)   Alcohol Screen    Last Alcohol Screening Score (AUDIT): 3  Housing: Low Risk (08/03/2024)   Epic    Unable to Pay for Housing in the Last Year: No    Number of Times Moved in the Last Year: 0    Homeless in the Last Year: No  Utilities: Not on file  Health Literacy: Not on file    Outpatient Medications Prior to Visit  Medication Sig Dispense Refill   Apremilast  ER (OTEZLA  XR) 75 MG TB24 Take one tablet by mouth once daily. 30 tablet 2   B Complex-C (SUPER B COMPLEX PO) Take 1 tablet by mouth daily.     buPROPion  (WELLBUTRIN  XL) 300 MG 24 hr tablet Take 1 tablet (300 mg total) by mouth daily. 90 tablet 0   cetirizine (ZYRTEC) 10 MG tablet Take 10 mg by mouth daily.     clobetasol  (TEMOVATE ) 0.05 % external solution Apply 1 application topically 2 (two) times daily. 50 mL 1   clobetasol  ointment (TEMOVATE ) 0.05 % Apply 1 Application topically 2 (two) times daily as needed. 45 g 1   cyanocobalamin  (VITAMIN B12) 1000 MCG/ML injection Inject 1 mL (1,000 mcg total) into the muscle once a week. 12 mL 4   diltiazem  (CARDIZEM  CD) 120 MG 24 hr capsule Take 1 capsule (120 mg total) by mouth daily. *First attempt, patient needs an appt. for additional refills.* 90 capsule 3   docusate sodium (COLACE) 100 MG capsule Take 100 mg by mouth every other day.     EPINEPHrine  (EPIPEN  2-PAK) 0.3 mg/0.3 mL IJ SOAJ injection Inject 0.3 mg into the muscle as needed for anaphylaxis. 2 each 1   Ferrous Sulfate (IRON ) 325 (65 Fe) MG TABS Take 1 tablet (325 mg total) by mouth every other day. 30 tablet 0   folic acid   (FOLVITE ) 1 MG tablet Take 1 mg by mouth daily.     furosemide  (LASIX ) 20 MG tablet Take 1 tablet (20 mg total) by mouth daily. 90 tablet 1   gabapentin  (NEURONTIN ) 100 MG capsule Take 1 capsule (100 mg total) by mouth 2 (two) times daily. 180 capsule 1   ketoconazole (NIZORAL) 2 % shampoo Apply 1 application. topically daily.     norethindrone -ethinyl estradiol -iron  (BLISOVI  FE 1.5/30) 1.5-30 MG-MCG tablet Take 1 tablet by mouth daily. Skip placebo and take continuously 84 tablet 6   tirzepatide  10 MG/0.5ML injection vial Inject 10 mg into the skin once a week.     sertraline  (ZOLOFT ) 50 MG tablet Take 1 tablet (50 mg total) by mouth daily. 90 tablet 0   No facility-administered medications prior to visit.    Allergies[1]  ROS See HPI    Objective:    Physical Exam   BP (!) 107/55 (BP Location: Right Arm, Patient Position: Sitting, Cuff Size: Normal)   Pulse 74   Temp 98.4 F (36.9 C) (Oral)   Resp (!) 100   Ht 5' 4 (1.626 m)   Wt 201 lb (91.2 kg)   HC 16 (40.6 cm)   BMI 34.50 kg/m  Wt Readings from Last 3 Encounters:  09/20/24 201 lb (91.2 kg)  08/08/24 213 lb (96.6 kg)  06/28/24 218 lb (98.9 kg)   Gen: Awake, alert, no acute distress Resp: Breathing is even and non-labored Psych: calm/pleasant demeanor Neuro: Alert and Oriented x 3, + facial  symmetry, speech is clear.      Assessment & Plan:   Problem List Items Addressed This Visit       Unprioritized   Depression - Primary   Some improvement with the addition of sertraline .  Will continue current dose of sertraline  and wellbutrin .  If she decides that she would like to try a higher dose of sertraline , I have recommended that she send me a message in [place].       Relevant Medications   sertraline  (ZOLOFT ) 50 MG tablet    I am having Suzen Deronda Cramp maintain her folic acid , docusate sodium, ketoconazole, Iron , cetirizine, B Complex-C (SUPER B COMPLEX PO), EPINEPHrine , clobetasol  ointment,  norethindrone -ethinyl estradiol -iron , diltiazem , furosemide , tirzepatide , clobetasol , gabapentin , cyanocobalamin , Otezla  XR, buPROPion , and sertraline .  Meds ordered this encounter  Medications   sertraline  (ZOLOFT ) 50 MG tablet    Sig: Take 1 tablet (50 mg total) by mouth daily.    Dispense:  90 tablet    Refill:  0    Supervising Provider:   DOMENICA BLACKBIRD A [[id number]]      [1]  Allergies Allergen Reactions   Almond Oil Swelling   Apple Juice Swelling   Kiwi Extract Swelling   Soy Allergy (Obsolete) Itching and Swelling    Mouth/ tongue itch   Strawberry Extract Itching    Mouth/ tongue itch

## 2024-09-20 NOTE — Assessment & Plan Note (Signed)
 Some improvement with the addition of sertraline .  Will continue current dose of sertraline  and wellbutrin .  If she decides that she would like to try a higher dose of sertraline , I have recommended that she send me a message in [place].

## 2024-09-20 NOTE — Patient Instructions (Signed)
°  VISIT SUMMARY: Today, we discussed your ongoing management of depression. You reported some improvement in mood with your current medications, but you still lack motivation for activities, especially on your days off. We reviewed your current treatment plan.  YOUR PLAN: -DEPRESSIVE DISORDER: Depression is a mood disorder that causes persistent feelings of sadness and loss of interest. You have shown improvement in irritability and frustration with your current medications, Wellbutrin  XL (bupropion ) 300 mg and Zoloft  (sertraline ). We will continue with these medications at their current doses. A 90-day refill has been sent for both medications. If your symptoms do not improve, we may consider increasing the dose of sertraline . Please follow up in 3 months or sooner if needed.

## 2024-10-04 ENCOUNTER — Other Ambulatory Visit: Payer: Self-pay

## 2024-10-04 NOTE — Progress Notes (Signed)
 Specialty Pharmacy Refill Coordination Note  Emily [name] is a 48 y.o. female contacted today regarding refills of specialty medication(s) Apremilast  (Otezla  XR)   Patient requested Delivery   Delivery date: 10/13/24   Verified address: [address] [place] [address]   Medication will be filled on: 10/12/24

## 2024-10-12 ENCOUNTER — Other Ambulatory Visit: Payer: Self-pay

## 2024-10-16 ENCOUNTER — Encounter: Payer: Self-pay | Admitting: Hematology & Oncology

## 2024-10-16 ENCOUNTER — Other Ambulatory Visit (HOSPITAL_BASED_OUTPATIENT_CLINIC_OR_DEPARTMENT_OTHER): Payer: Self-pay

## 2024-10-27 ENCOUNTER — Other Ambulatory Visit (HOSPITAL_BASED_OUTPATIENT_CLINIC_OR_DEPARTMENT_OTHER): Payer: Self-pay

## 2024-10-31 ENCOUNTER — Other Ambulatory Visit (HOSPITAL_BASED_OUTPATIENT_CLINIC_OR_DEPARTMENT_OTHER): Payer: Self-pay

## 2024-10-31 ENCOUNTER — Other Ambulatory Visit: Payer: Self-pay

## 2024-11-03 ENCOUNTER — Other Ambulatory Visit (HOSPITAL_COMMUNITY): Payer: Self-pay

## 2024-11-08 ENCOUNTER — Other Ambulatory Visit: Payer: Self-pay

## 2024-11-08 NOTE — Progress Notes (Signed)
 Specialty Pharmacy Refill Coordination Note  Hilari Wethington is a 49 y.o. female contacted today regarding refills of specialty medication(s) Apremilast  (Otezla  XR)   Patient requested Delivery   Delivery date: 11/23/24   Verified address: [address], [place], [place] [address]   Medication will be filled on: 11/22/24

## 2025-05-09 ENCOUNTER — Inpatient Hospital Stay

## 2025-05-09 ENCOUNTER — Ambulatory Visit: Admitting: Medical Oncology
